# Patient Record
Sex: Male | Born: 1950 | Race: White | Hispanic: No | Marital: Married | Smoking: Current every day smoker
Health system: Southern US, Community
[De-identification: ages and names within clinical notes are randomized; demographics above are authoritative.]

## PROBLEM LIST (undated history)

## (undated) DIAGNOSIS — K700 Alcoholic fatty liver: Secondary | ICD-10-CM

## (undated) DIAGNOSIS — R569 Unspecified convulsions: Secondary | ICD-10-CM

## (undated) DIAGNOSIS — K5790 Diverticulosis of intestine, part unspecified, without perforation or abscess without bleeding: Secondary | ICD-10-CM

## (undated) DIAGNOSIS — F10931 Alcohol use, unspecified with withdrawal delirium: Secondary | ICD-10-CM

## (undated) DIAGNOSIS — G40409 Other generalized epilepsy and epileptic syndromes, not intractable, without status epilepticus: Secondary | ICD-10-CM

## (undated) DIAGNOSIS — G252 Other specified forms of tremor: Secondary | ICD-10-CM

## (undated) DIAGNOSIS — D649 Anemia, unspecified: Secondary | ICD-10-CM

## (undated) DIAGNOSIS — J45909 Unspecified asthma, uncomplicated: Secondary | ICD-10-CM

## (undated) DIAGNOSIS — S2239XA Fracture of one rib, unspecified side, initial encounter for closed fracture: Secondary | ICD-10-CM

## (undated) DIAGNOSIS — K863 Pseudocyst of pancreas: Secondary | ICD-10-CM

## (undated) DIAGNOSIS — I10 Essential (primary) hypertension: Secondary | ICD-10-CM

## (undated) DIAGNOSIS — Q539 Undescended testicle, unspecified: Secondary | ICD-10-CM

## (undated) DIAGNOSIS — D759 Disease of blood and blood-forming organs, unspecified: Secondary | ICD-10-CM

## (undated) DIAGNOSIS — F32A Depression, unspecified: Secondary | ICD-10-CM

## (undated) DIAGNOSIS — Z515 Encounter for palliative care: Secondary | ICD-10-CM

## (undated) DIAGNOSIS — F419 Anxiety disorder, unspecified: Secondary | ICD-10-CM

## (undated) DIAGNOSIS — J189 Pneumonia, unspecified organism: Secondary | ICD-10-CM

## (undated) DIAGNOSIS — K219 Gastro-esophageal reflux disease without esophagitis: Secondary | ICD-10-CM

## (undated) DIAGNOSIS — F10231 Alcohol dependence with withdrawal delirium: Secondary | ICD-10-CM

## (undated) DIAGNOSIS — R51 Headache: Secondary | ICD-10-CM

## (undated) DIAGNOSIS — K625 Hemorrhage of anus and rectum: Secondary | ICD-10-CM

## (undated) DIAGNOSIS — M545 Low back pain, unspecified: Secondary | ICD-10-CM

## (undated) DIAGNOSIS — S065X9A Traumatic subdural hemorrhage with loss of consciousness of unspecified duration, initial encounter: Secondary | ICD-10-CM

## (undated) DIAGNOSIS — Z8719 Personal history of other diseases of the digestive system: Secondary | ICD-10-CM

## (undated) DIAGNOSIS — G8929 Other chronic pain: Secondary | ICD-10-CM

## (undated) DIAGNOSIS — K259 Gastric ulcer, unspecified as acute or chronic, without hemorrhage or perforation: Secondary | ICD-10-CM

## (undated) DIAGNOSIS — I739 Peripheral vascular disease, unspecified: Secondary | ICD-10-CM

## (undated) DIAGNOSIS — K852 Alcohol induced acute pancreatitis without necrosis or infection: Secondary | ICD-10-CM

## (undated) DIAGNOSIS — E119 Type 2 diabetes mellitus without complications: Secondary | ICD-10-CM

## (undated) DIAGNOSIS — D7589 Other specified diseases of blood and blood-forming organs: Secondary | ICD-10-CM

## (undated) DIAGNOSIS — I724 Aneurysm of artery of lower extremity: Secondary | ICD-10-CM

## (undated) DIAGNOSIS — K635 Polyp of colon: Secondary | ICD-10-CM

## (undated) DIAGNOSIS — F329 Major depressive disorder, single episode, unspecified: Secondary | ICD-10-CM

## (undated) HISTORY — PX: INCISION AND DRAINAGE OF WOUND: SHX1803

## (undated) HISTORY — DX: Aneurysm of artery of lower extremity: I72.4

## (undated) HISTORY — PX: ANGIOPLASTY / STENTING ILIAC: SUR31

---
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
## 1989-04-24 HISTORY — PX: LIGAMENT REPAIR: SHX5444

## 1999-06-01 ENCOUNTER — Encounter: Payer: Self-pay | Admitting: Emergency Medicine

## 1999-06-01 ENCOUNTER — Emergency Department (HOSPITAL_COMMUNITY): Admission: EM | Admit: 1999-06-01 | Discharge: 1999-06-01 | Payer: Self-pay | Admitting: Emergency Medicine

## 2002-07-23 ENCOUNTER — Inpatient Hospital Stay (HOSPITAL_COMMUNITY): Admission: EM | Admit: 2002-07-23 | Discharge: 2002-07-26 | Payer: Self-pay | Admitting: Emergency Medicine

## 2002-07-24 DIAGNOSIS — J189 Pneumonia, unspecified organism: Secondary | ICD-10-CM

## 2002-07-24 HISTORY — DX: Pneumonia, unspecified organism: J18.9

## 2002-08-25 ENCOUNTER — Encounter: Payer: Self-pay | Admitting: Internal Medicine

## 2002-08-25 ENCOUNTER — Encounter: Admission: RE | Admit: 2002-08-25 | Discharge: 2002-08-25 | Payer: Self-pay | Admitting: Internal Medicine

## 2002-09-04 ENCOUNTER — Encounter: Payer: Self-pay | Admitting: Internal Medicine

## 2002-09-04 ENCOUNTER — Encounter: Admission: RE | Admit: 2002-09-04 | Discharge: 2002-09-04 | Payer: Self-pay | Admitting: Internal Medicine

## 2003-02-22 DIAGNOSIS — K625 Hemorrhage of anus and rectum: Secondary | ICD-10-CM

## 2003-02-22 HISTORY — DX: Hemorrhage of anus and rectum: K62.5

## 2003-03-23 ENCOUNTER — Encounter (INDEPENDENT_AMBULATORY_CARE_PROVIDER_SITE_OTHER): Payer: Self-pay | Admitting: Specialist

## 2003-03-23 ENCOUNTER — Ambulatory Visit (HOSPITAL_COMMUNITY): Admission: RE | Admit: 2003-03-23 | Discharge: 2003-03-23 | Payer: Self-pay | Admitting: Gastroenterology

## 2003-04-23 ENCOUNTER — Encounter: Payer: Self-pay | Admitting: Internal Medicine

## 2003-04-23 ENCOUNTER — Emergency Department (HOSPITAL_COMMUNITY): Admission: EM | Admit: 2003-04-23 | Discharge: 2003-04-23 | Payer: Self-pay | Admitting: Emergency Medicine

## 2003-04-23 ENCOUNTER — Ambulatory Visit (HOSPITAL_COMMUNITY): Admission: RE | Admit: 2003-04-23 | Discharge: 2003-04-23 | Payer: Self-pay | Admitting: Internal Medicine

## 2003-04-25 ENCOUNTER — Ambulatory Visit (HOSPITAL_COMMUNITY): Admission: RE | Admit: 2003-04-25 | Discharge: 2003-04-25 | Payer: Self-pay | Admitting: Internal Medicine

## 2003-04-25 ENCOUNTER — Encounter: Payer: Self-pay | Admitting: Internal Medicine

## 2003-05-14 ENCOUNTER — Encounter: Admission: RE | Admit: 2003-05-14 | Discharge: 2003-05-14 | Payer: Self-pay | Admitting: Gastroenterology

## 2003-05-14 ENCOUNTER — Encounter: Payer: Self-pay | Admitting: Gastroenterology

## 2003-11-19 ENCOUNTER — Emergency Department (HOSPITAL_COMMUNITY): Admission: EM | Admit: 2003-11-19 | Discharge: 2003-11-19 | Payer: Self-pay | Admitting: Emergency Medicine

## 2004-01-16 ENCOUNTER — Encounter: Admission: RE | Admit: 2004-01-16 | Discharge: 2004-01-16 | Payer: Self-pay | Admitting: Internal Medicine

## 2004-01-18 ENCOUNTER — Encounter: Admission: RE | Admit: 2004-01-18 | Discharge: 2004-01-18 | Payer: Self-pay | Admitting: Internal Medicine

## 2004-01-23 ENCOUNTER — Inpatient Hospital Stay (HOSPITAL_COMMUNITY): Admission: EM | Admit: 2004-01-23 | Discharge: 2004-01-27 | Payer: Self-pay | Admitting: Emergency Medicine

## 2004-02-13 ENCOUNTER — Emergency Department (HOSPITAL_COMMUNITY): Admission: EM | Admit: 2004-02-13 | Discharge: 2004-02-14 | Payer: Self-pay | Admitting: Emergency Medicine

## 2004-06-10 ENCOUNTER — Ambulatory Visit (HOSPITAL_COMMUNITY): Admission: RE | Admit: 2004-06-10 | Discharge: 2004-06-10 | Payer: Self-pay | Admitting: Gastroenterology

## 2004-07-24 DIAGNOSIS — S2239XA Fracture of one rib, unspecified side, initial encounter for closed fracture: Secondary | ICD-10-CM

## 2004-07-24 HISTORY — DX: Fracture of one rib, unspecified side, initial encounter for closed fracture: S22.39XA

## 2004-08-21 ENCOUNTER — Inpatient Hospital Stay (HOSPITAL_COMMUNITY): Admission: EM | Admit: 2004-08-21 | Discharge: 2004-08-29 | Payer: Self-pay | Admitting: Emergency Medicine

## 2005-01-19 ENCOUNTER — Inpatient Hospital Stay (HOSPITAL_COMMUNITY): Admission: EM | Admit: 2005-01-19 | Discharge: 2005-01-25 | Payer: Self-pay | Admitting: Emergency Medicine

## 2005-02-21 DIAGNOSIS — S065X9A Traumatic subdural hemorrhage with loss of consciousness of unspecified duration, initial encounter: Secondary | ICD-10-CM

## 2005-02-21 DIAGNOSIS — S065XAA Traumatic subdural hemorrhage with loss of consciousness status unknown, initial encounter: Secondary | ICD-10-CM

## 2005-02-21 HISTORY — DX: Traumatic subdural hemorrhage with loss of consciousness of unspecified duration, initial encounter: S06.5X9A

## 2005-02-21 HISTORY — DX: Traumatic subdural hemorrhage with loss of consciousness status unknown, initial encounter: S06.5XAA

## 2005-03-05 ENCOUNTER — Inpatient Hospital Stay (HOSPITAL_COMMUNITY): Admission: EM | Admit: 2005-03-05 | Discharge: 2005-03-15 | Payer: Self-pay | Admitting: Emergency Medicine

## 2005-04-14 ENCOUNTER — Encounter: Admission: RE | Admit: 2005-04-14 | Discharge: 2005-04-14 | Payer: Self-pay | Admitting: Neurosurgery

## 2005-04-20 ENCOUNTER — Ambulatory Visit (HOSPITAL_COMMUNITY): Admission: RE | Admit: 2005-04-20 | Discharge: 2005-04-20 | Payer: Self-pay | Admitting: Neurosurgery

## 2005-05-04 ENCOUNTER — Emergency Department (HOSPITAL_COMMUNITY): Admission: EM | Admit: 2005-05-04 | Discharge: 2005-05-04 | Payer: Self-pay | Admitting: Emergency Medicine

## 2005-07-03 ENCOUNTER — Encounter: Admission: RE | Admit: 2005-07-03 | Discharge: 2005-07-03 | Payer: Self-pay | Admitting: Neurosurgery

## 2005-11-15 ENCOUNTER — Emergency Department (HOSPITAL_COMMUNITY): Admission: EM | Admit: 2005-11-15 | Discharge: 2005-11-16 | Payer: Self-pay | Admitting: Emergency Medicine

## 2006-01-02 ENCOUNTER — Emergency Department (HOSPITAL_COMMUNITY): Admission: EM | Admit: 2006-01-02 | Discharge: 2006-01-03 | Payer: Self-pay | Admitting: Emergency Medicine

## 2006-01-07 ENCOUNTER — Observation Stay (HOSPITAL_COMMUNITY): Admission: EM | Admit: 2006-01-07 | Discharge: 2006-01-11 | Payer: Self-pay | Admitting: Neurology

## 2006-02-24 ENCOUNTER — Emergency Department (HOSPITAL_COMMUNITY): Admission: EM | Admit: 2006-02-24 | Discharge: 2006-02-25 | Payer: Self-pay | Admitting: Emergency Medicine

## 2008-06-25 ENCOUNTER — Inpatient Hospital Stay (HOSPITAL_COMMUNITY): Admission: EM | Admit: 2008-06-25 | Discharge: 2008-06-27 | Payer: Self-pay | Admitting: Emergency Medicine

## 2008-11-06 ENCOUNTER — Inpatient Hospital Stay (HOSPITAL_COMMUNITY): Admission: EM | Admit: 2008-11-06 | Discharge: 2008-11-10 | Payer: Self-pay | Admitting: Emergency Medicine

## 2009-03-09 ENCOUNTER — Inpatient Hospital Stay (HOSPITAL_COMMUNITY): Admission: EM | Admit: 2009-03-09 | Discharge: 2009-03-11 | Payer: Self-pay | Admitting: Emergency Medicine

## 2009-05-03 ENCOUNTER — Inpatient Hospital Stay (HOSPITAL_COMMUNITY): Admission: EM | Admit: 2009-05-03 | Discharge: 2009-05-08 | Payer: Self-pay | Admitting: Emergency Medicine

## 2009-05-15 ENCOUNTER — Encounter: Admission: RE | Admit: 2009-05-15 | Discharge: 2009-05-15 | Payer: Self-pay | Admitting: Family Medicine

## 2009-11-30 IMAGING — CR DG CHEST 1V PORT
1 series · 1 of 1 positions shown · non-contrast
Comparison: Chest x-ray of 06/26/2008

CLINICAL DATA: Nodular opacity lung bases, repeat film with nipple
markers

PORTABLE CHEST - 1 VIEW

[AP]
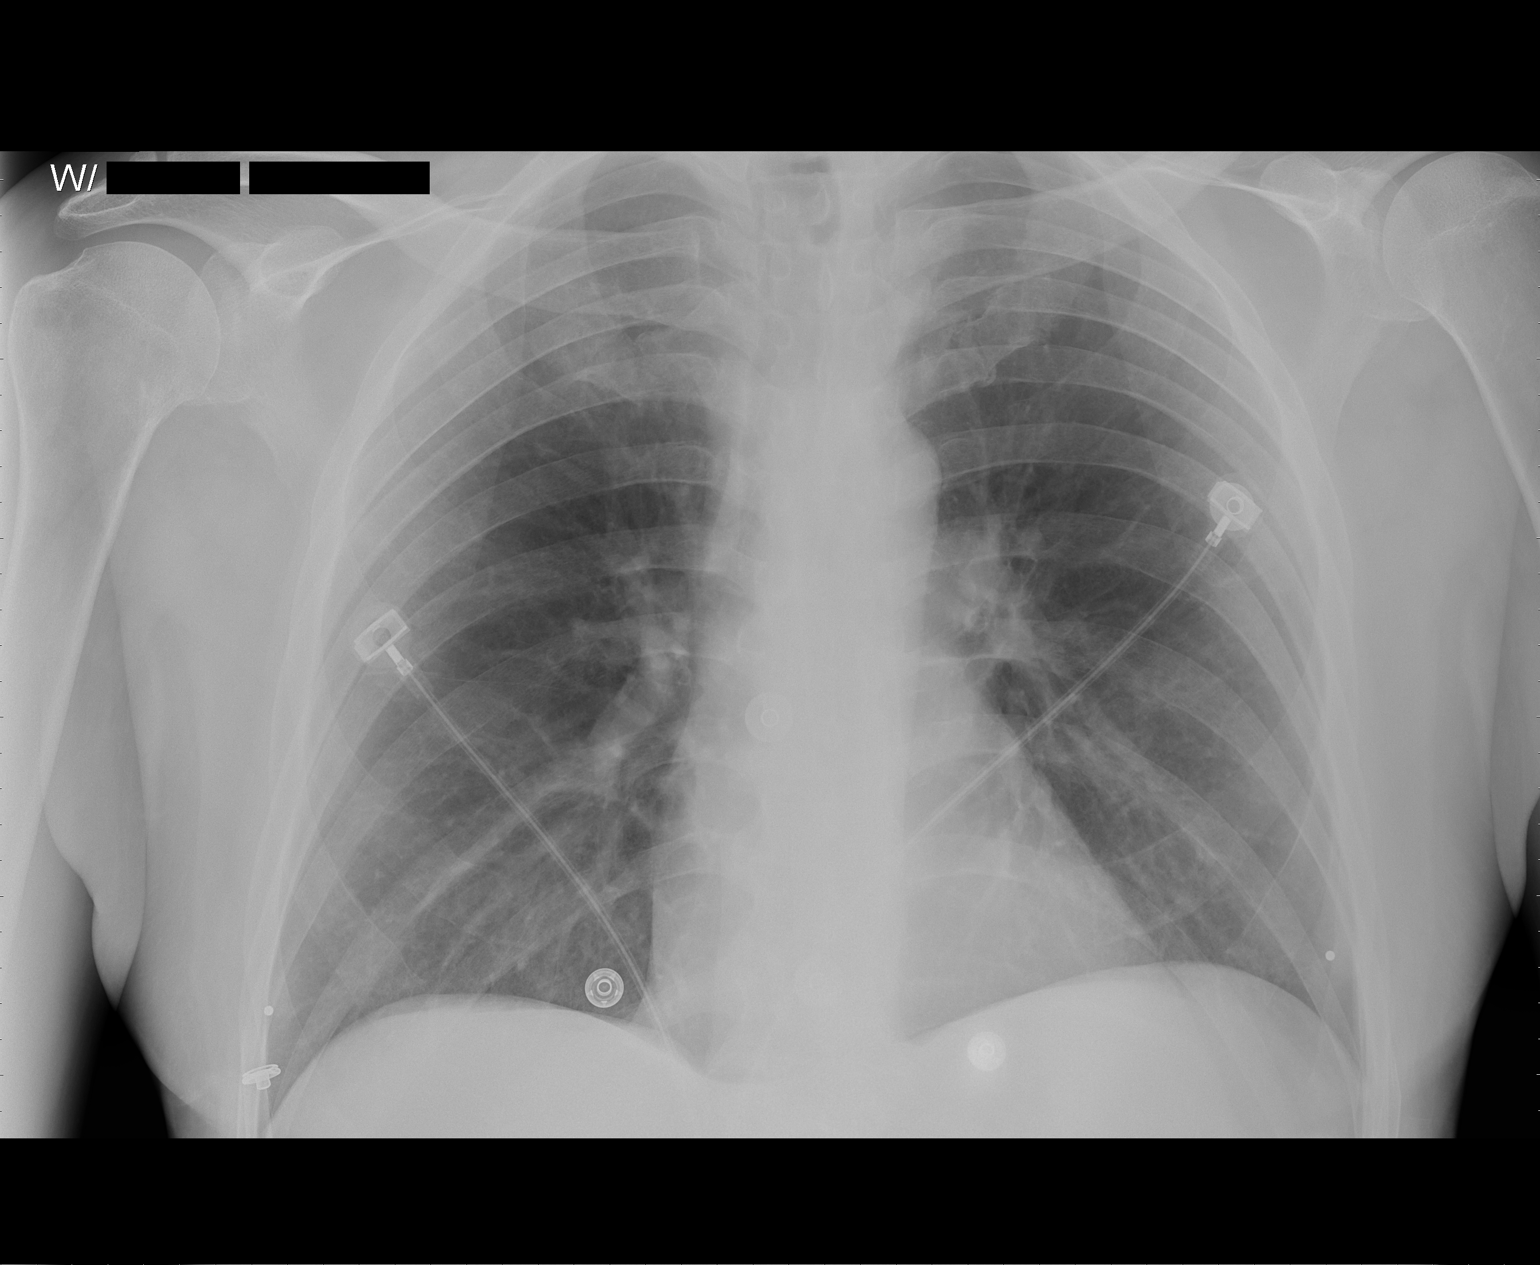

[1 of 1 positions shown; findings below may reference images not displayed]

FINDINGS: With nipple markers placed, no additional lung nodule is
seen.  No infiltrate or effusion is noted.  Heart is within normal
limits in size.
IMPRESSION: No persistent lung nodule after placement nipple markers.

## 2009-11-30 IMAGING — CT CT PELVIS W/ CM
2 of 5 series · 16 of 46 positions shown, 18 images · IV contrast (APPLIED)
Comparison: Acute abdomen series 1 day prior.

CLINICAL DATA: Vomiting for 4 days.  Diabetic ketoacidosis.

CT ABDOMEN AND PELVIS WITH CONTRAST
TECHNIQUE: Multidetector CT imaging of the abdomen and pelvis was
performed following the standard protocol following the bolus
administration of intravenous contrast.
Contrast: 100 ml Ymnipaque-JOO

[Series 2: abd/pelv with 5.0 b31f st · axial · 0.71mm/px · z∈[-494,-104]mm · 13 of 86 slices shown, 15 images]
[im 4/86  soft-tissue]
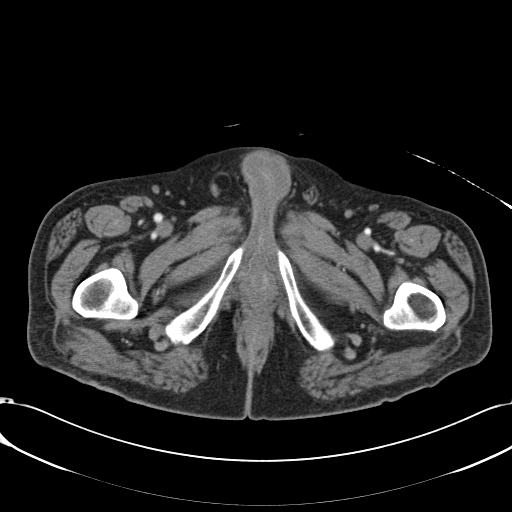
[im 4/86  bone]
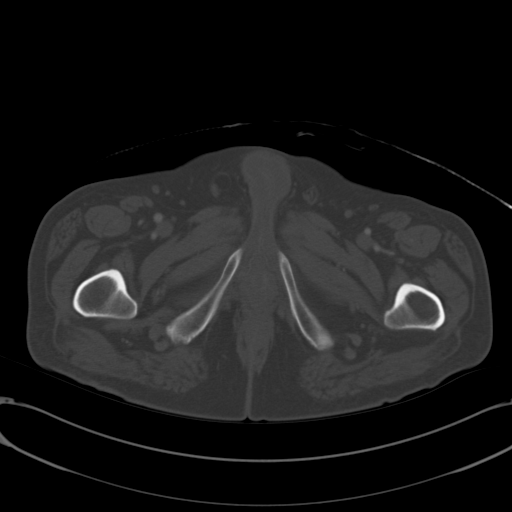
[im 12/86  soft-tissue]
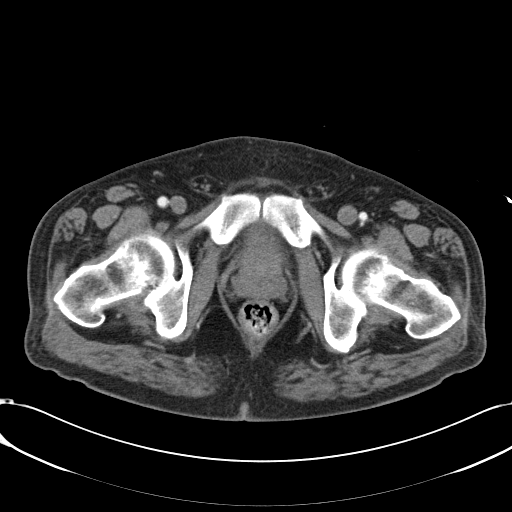
[im 20/86  soft-tissue]
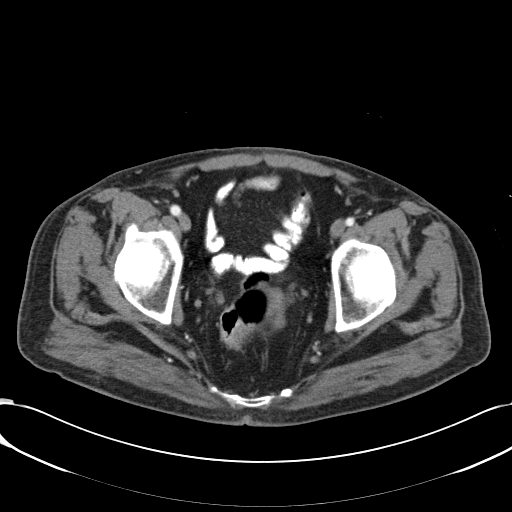
[im 24/86  soft-tissue]
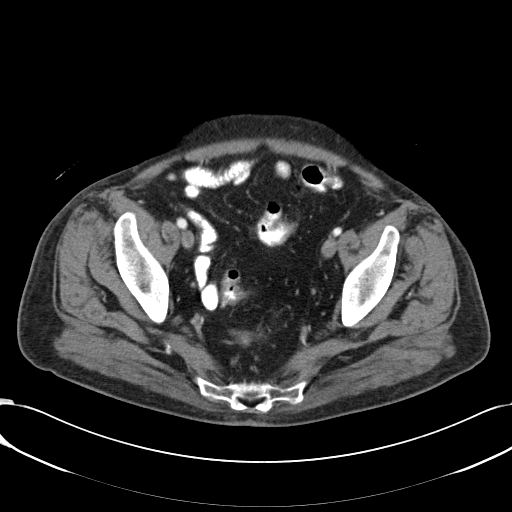
[im 31/86  soft-tissue]
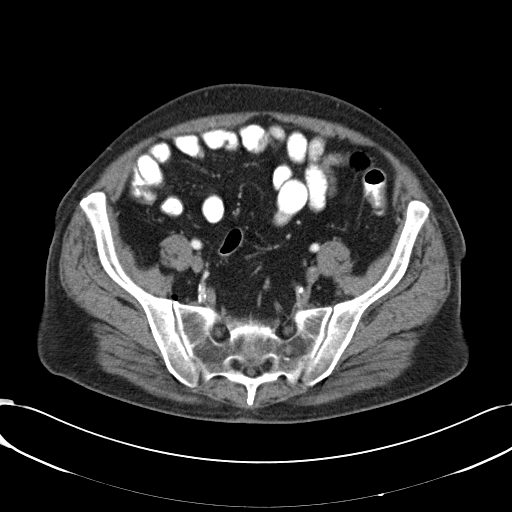
[im 35/86  soft-tissue]
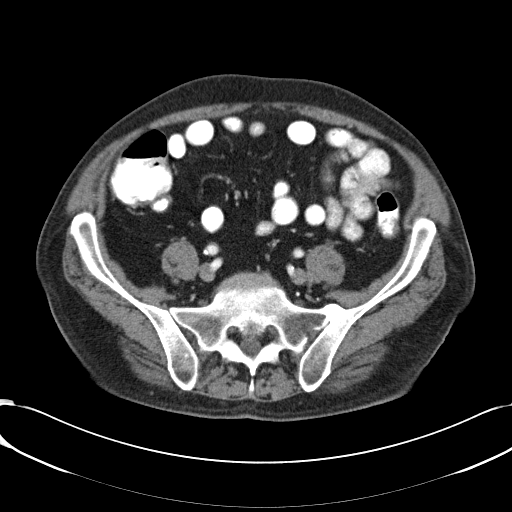
[im 43/86  soft-tissue]
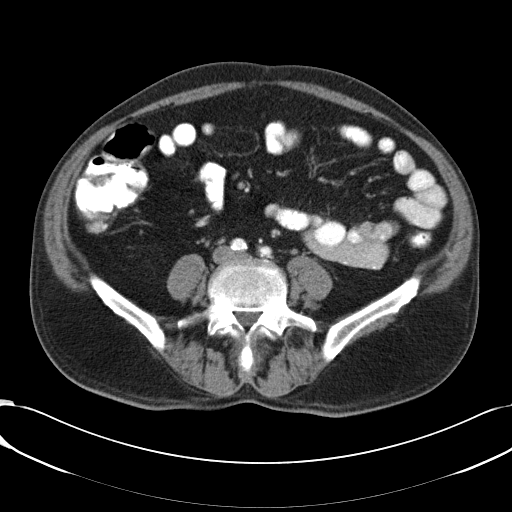
[im 51/86  soft-tissue]
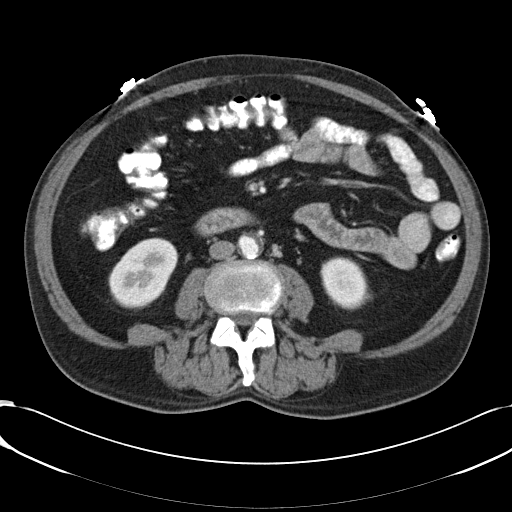
[im 55/86  soft-tissue]
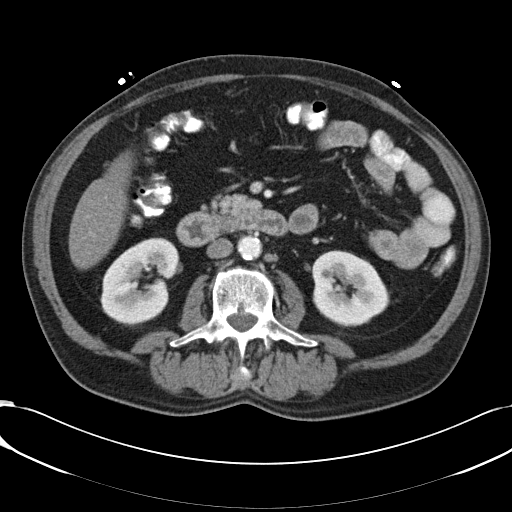
[im 55/86  bone]
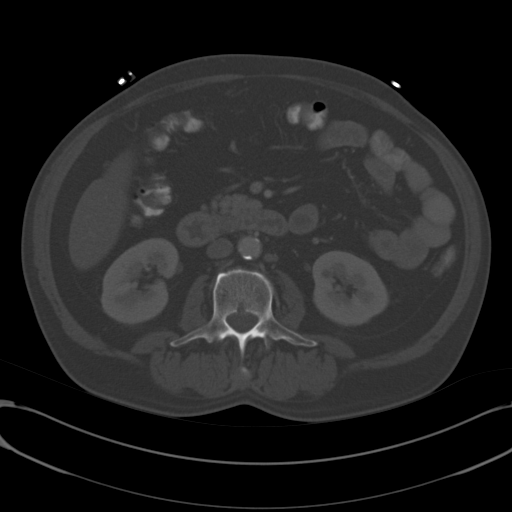
[im 62/86  soft-tissue]
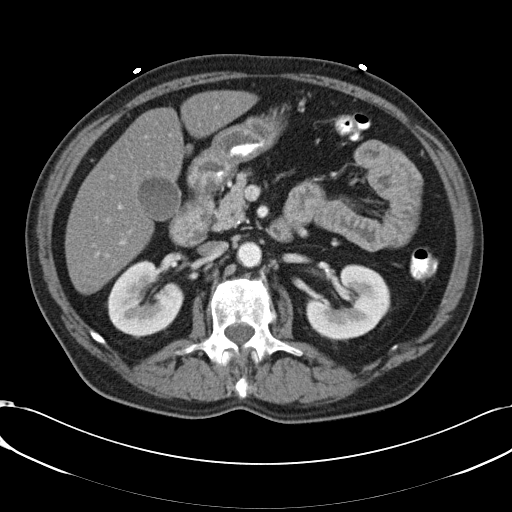
[im 66/86  soft-tissue]
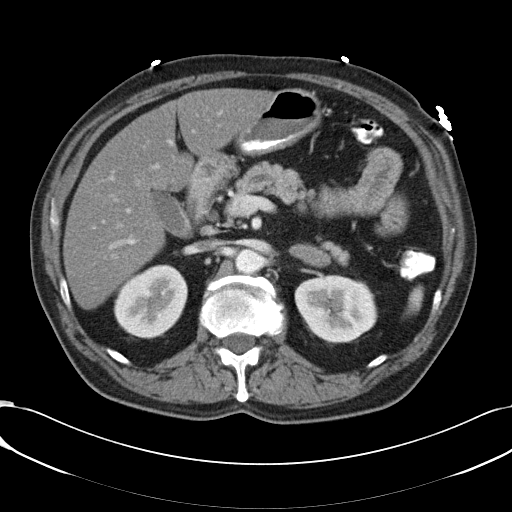
[im 74/86  soft-tissue]
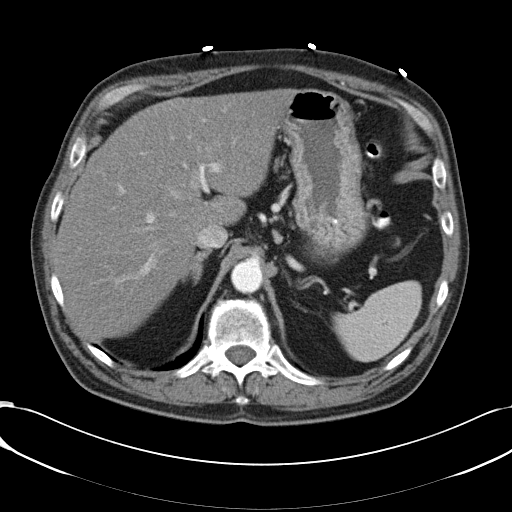
[im 82/86  soft-tissue]
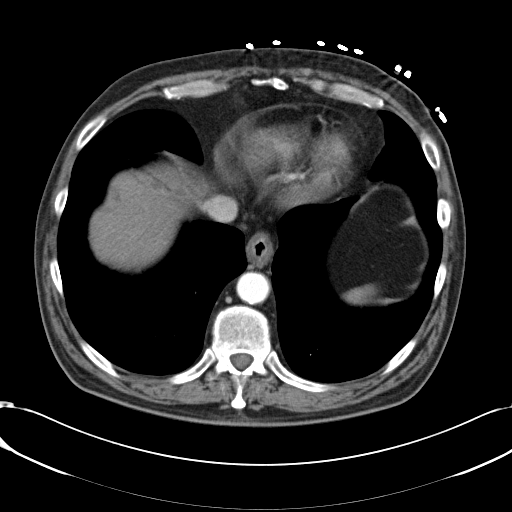

[Series 602: cor · coronal · 0.84mm/px · 3 of 84 slices shown]
[im 28/84  soft-tissue]
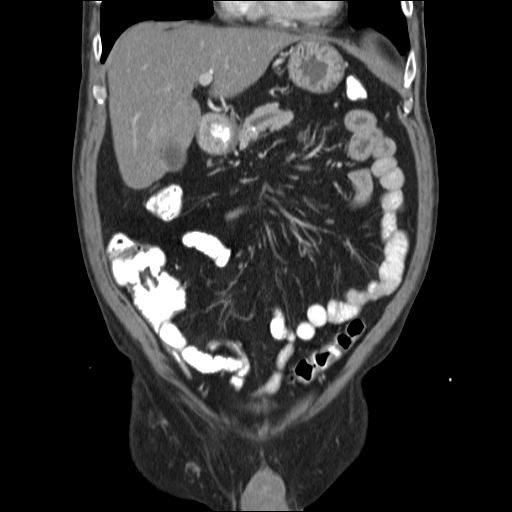
[im 37/84  soft-tissue]
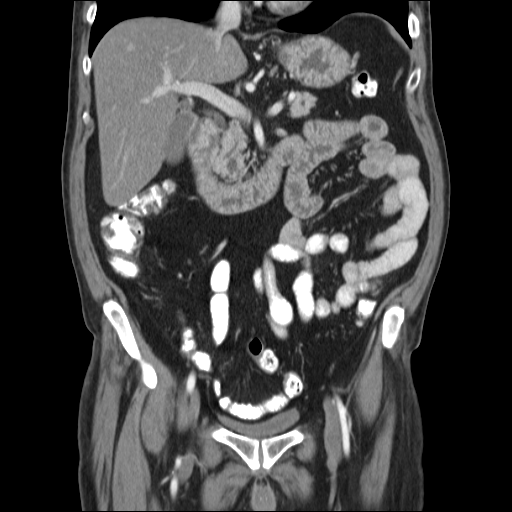
[im 47/84  soft-tissue]
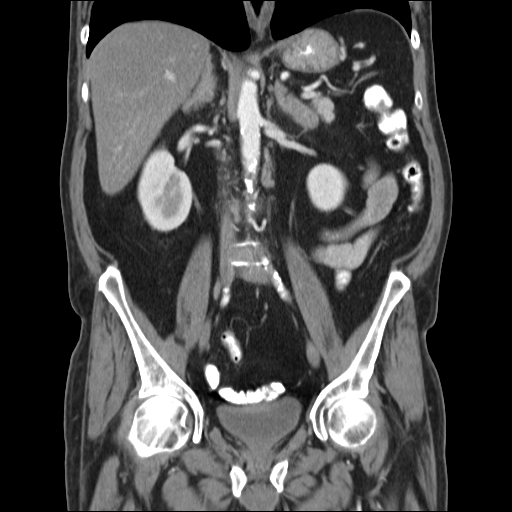

[16 of 46 positions shown; findings below may reference images not displayed]

Ultrasound of
01/19/2005.  MRCP of 06/10/2004.  Abdominal pelvic CT of 02/14/2004

CT ABDOMEN
FINDINGS: Clear lung bases.  Normal heart size without pericardial
or pleural effusion.

Moderate distal esophageal wall thickening on image three.

Moderate fatty infiltration of the liver.  Probable perfusion
anomaly at hepatic dome on image 90.  Not well visualized on the
prior exam.

Normal spleen.  Apparent greater curvature gastric wall thickening
likely related to underdistension.

Interval increase in pancreatic ductal dilatation.  This is
borderline/mild on the 02/11/2004 exam.  Now mild - moderate in the
pancreatic tail and upstream body.  Maximally 6 mm on image 21.
The duct is dilated to the level of a dominant pancreatic
calcification measuring 7 mm on image 22.  There is adjacent
smaller calcification just inferiorly.  The pancreatic duct in the
head uncinate process measures at the upper limits of normal.

Normal gallbladder.  No intrahepatic biliary ductal dilatation.

Hypoattenuating lesion in the uncinate process of the pancreas
measures 1.0 cm on image 29 and is likely similar to on image 35 of
the prior exam.  Most likely a pseudocyst.  No evidence of acute
pancreatitis.

The portal vein and splenic vein are patent.  There are no vascular
complications of pancreatitis

Normal right adrenal gland.  Mild left adrenal thickening which is
stable.  Normal right kidney.  Too small to characterize left renal
lesions.  Moderate aortic atherosclerosis. No retroperitoneal or
retrocrural adenopathy. Scattered colonic diverticula. Normal
terminal ileum appendix. Normal abdominal small bowel without
ascites.
IMPRESSION: 1.  Interval development/progression of upstream pancreatic ductal
dilatation to the level of a pancreatic duct stone of 7 mm.
Findings are consistent with chronic calcific pancreatitis.  No
evidence of underlying pancreatic duct mass.
2.  No evidence of acute pancreatitis.
3.  Probable uncinate process pseudocyst, stable.
4.  Fatty infiltration of the liver.
5.  Moderate distal esophageal wall thickening.  Most likely
esophagitis.  Neoplasia cannot be excluded given the extent of wall
thickening.  Consider further evaluation with endoscopy.

CT PELVIS
FINDINGS: Sigmoid diverticulosis.  Normal pelvic small bowel
loops.  Age advanced vascular calcifications. No pelvic adenopathy.
Normal urinary bladder and prostate.  No ascites.   Multifactorial
central canal lumbar stenosis.  At least partially secondary to
congenitally short pedicles.
IMPRESSION: 1. No acute pelvic process.

## 2010-05-01 ENCOUNTER — Inpatient Hospital Stay (HOSPITAL_COMMUNITY): Admission: EM | Admit: 2010-05-01 | Discharge: 2010-05-05 | Payer: Self-pay | Admitting: Emergency Medicine

## 2010-05-28 ENCOUNTER — Ambulatory Visit (HOSPITAL_COMMUNITY): Admission: RE | Admit: 2010-05-28 | Discharge: 2010-05-28 | Payer: Self-pay | Admitting: Gastroenterology

## 2010-09-14 ENCOUNTER — Encounter: Payer: Self-pay | Admitting: Neurosurgery

## 2010-11-05 LAB — GLUCOSE, CAPILLARY
Glucose-Capillary: 234 mg/dL — ABNORMAL HIGH (ref 70–99)
Glucose-Capillary: 236 mg/dL — ABNORMAL HIGH (ref 70–99)

## 2010-11-05 LAB — PANC CYST FLD ANLYS-PATHFNDR-TG

## 2010-11-06 LAB — CARDIAC PANEL(CRET KIN+CKTOT+MB+TROPI)
CK, MB: 1.1 ng/mL (ref 0.3–4.0)
CK, MB: 1.3 ng/mL (ref 0.3–4.0)
Relative Index: INVALID (ref 0.0–2.5)
Total CK: 39 U/L (ref 7–232)
Total CK: 39 U/L (ref 7–232)
Troponin I: 0.01 ng/mL (ref 0.00–0.06)

## 2010-11-06 LAB — BASIC METABOLIC PANEL
CO2: 26 mEq/L (ref 19–32)
CO2: 27 mEq/L (ref 19–32)
Calcium: 8.7 mg/dL (ref 8.4–10.5)
Creatinine, Ser: 0.74 mg/dL (ref 0.4–1.5)
GFR calc Af Amer: >60 mL/min (ref >60)
Glucose, Bld: 207 mg/dL — ABNORMAL HIGH (ref 70–99)
Glucose, Bld: 54 mg/dL — ABNORMAL LOW (ref 70–99)
Potassium: 3.5 mEq/L (ref 3.5–5.1)
Sodium: 138 mEq/L (ref 135–145)

## 2010-11-06 LAB — CULTURE, BLOOD (ROUTINE X 2)

## 2010-11-06 LAB — CBC
MCHC: 35.3 g/dL (ref 30.0–36.0)
Platelets: 183 10*3/uL (ref 150–400)
Platelets: 204 10*3/uL (ref 150–400)
RBC: 3.85 MIL/uL — ABNORMAL LOW (ref 4.22–5.81)
RDW: 14.2 % (ref 11.5–15.5)
RDW: 14.2 % (ref 11.5–15.5)
WBC: 10 10*3/uL (ref 4.0–10.5)
WBC: 7.8 10*3/uL (ref 4.0–10.5)

## 2010-11-06 LAB — DIFFERENTIAL
Basophils Relative: 0 % (ref 0–1)
Eosinophils Absolute: 0 10*3/uL (ref 0.0–0.7)
Lymphs Abs: 3.3 10*3/uL (ref 0.7–4.0)
Monocytes Absolute: 0.4 10*3/uL (ref 0.1–1.0)
Monocytes Relative: 6 % (ref 3–12)
Neutro Abs: 3.9 10*3/uL (ref 1.7–7.7)
Neutrophils Relative %: 51 % (ref 43–77)

## 2010-11-06 LAB — GLUCOSE, CAPILLARY
Glucose-Capillary: 113 mg/dL — ABNORMAL HIGH (ref 70–99)
Glucose-Capillary: 140 mg/dL — ABNORMAL HIGH (ref 70–99)
Glucose-Capillary: 165 mg/dL — ABNORMAL HIGH (ref 70–99)
Glucose-Capillary: 169 mg/dL — ABNORMAL HIGH (ref 70–99)
Glucose-Capillary: 229 mg/dL — ABNORMAL HIGH (ref 70–99)
Glucose-Capillary: 236 mg/dL — ABNORMAL HIGH (ref 70–99)
Glucose-Capillary: 246 mg/dL — ABNORMAL HIGH (ref 70–99)
Glucose-Capillary: 54 mg/dL — ABNORMAL LOW (ref 70–99)

## 2010-11-06 LAB — COMPREHENSIVE METABOLIC PANEL
ALT: 46 U/L (ref 0–53)
ALT: 60 U/L — ABNORMAL HIGH (ref 0–53)
AST: 42 U/L — ABNORMAL HIGH (ref 0–37)
Albumin: 3.6 g/dL (ref 3.5–5.2)
Albumin: 4.1 g/dL (ref 3.5–5.2)
Alkaline Phosphatase: 56 U/L (ref 39–117)
Alkaline Phosphatase: 70 U/L (ref 39–117)
BUN: 4 mg/dL — ABNORMAL LOW (ref 6–23)
Calcium: 8.8 mg/dL (ref 8.4–10.5)
GFR calc Af Amer: >60 mL/min (ref >60)
GFR calc Af Amer: >60 mL/min (ref >60)
Potassium: 3.3 mEq/L — ABNORMAL LOW (ref 3.5–5.1)
Potassium: 3.5 mEq/L (ref 3.5–5.1)
Sodium: 139 mEq/L (ref 135–145)
Sodium: 141 mEq/L (ref 135–145)
Total Protein: 6.4 g/dL (ref 6.0–8.3)
Total Protein: 7.5 g/dL (ref 6.0–8.3)

## 2010-11-06 LAB — URINE MICROSCOPIC-ADD ON

## 2010-11-06 LAB — HEMOGLOBIN A1C: Mean Plasma Glucose: 183 mg/dL — ABNORMAL HIGH (ref <117)

## 2010-11-06 LAB — URINALYSIS, ROUTINE W REFLEX MICROSCOPIC
Glucose, UA: >1000 mg/dL — AB
Hgb urine dipstick: NEGATIVE
Leukocytes, UA: NEGATIVE
Protein, ur: NEGATIVE mg/dL
pH: 6 (ref 5.0–8.0)

## 2010-11-06 LAB — POCT CARDIAC MARKERS
CKMB, poc: <1 ng/mL — ABNORMAL LOW (ref 1.0–8.0)
Myoglobin, poc: 27.8 ng/mL (ref 12–200)
Troponin i, poc: <0.05 ng/mL (ref 0.00–0.09)

## 2010-11-06 LAB — ETHANOL: Alcohol, Ethyl (B): 278 mg/dL — ABNORMAL HIGH (ref 0–10)

## 2010-11-06 LAB — PROTIME-INR
INR: 0.93 (ref 0.00–1.49)
Prothrombin Time: 12.7 seconds (ref 11.6–15.2)

## 2010-11-06 LAB — LIPASE, BLOOD: Lipase: 22 U/L (ref 11–59)

## 2010-11-06 LAB — TSH: TSH: 1.849 u[IU]/mL (ref 0.350–4.500)

## 2010-11-28 LAB — COMPREHENSIVE METABOLIC PANEL
AST: 38 U/L — ABNORMAL HIGH (ref 0–37)
Albumin: 3 g/dL — ABNORMAL LOW (ref 3.5–5.2)
Alkaline Phosphatase: 59 U/L (ref 39–117)
BUN: 11 mg/dL (ref 6–23)
BUN: 3 mg/dL — ABNORMAL LOW (ref 6–23)
BUN: 8 mg/dL (ref 6–23)
CO2: 22 mEq/L (ref 19–32)
CO2: 28 mEq/L (ref 19–32)
CO2: 30 mEq/L (ref 19–32)
Calcium: 8.7 mg/dL (ref 8.4–10.5)
Calcium: 8.8 mg/dL (ref 8.4–10.5)
Chloride: 102 mEq/L (ref 96–112)
Chloride: 103 mEq/L (ref 96–112)
Chloride: 106 mEq/L (ref 96–112)
Creatinine, Ser: 0.56 mg/dL (ref 0.4–1.5)
Creatinine, Ser: 0.56 mg/dL (ref 0.4–1.5)
Creatinine, Ser: 0.57 mg/dL (ref 0.4–1.5)
GFR calc Af Amer: >60 mL/min (ref >60)
GFR calc non Af Amer: >60 mL/min (ref >60)
GFR calc non Af Amer: >60 mL/min (ref >60)
GFR calc non Af Amer: >60 mL/min (ref >60)
Glucose, Bld: 147 mg/dL — ABNORMAL HIGH (ref 70–99)
Total Bilirubin: 0.5 mg/dL (ref 0.3–1.2)
Total Bilirubin: 0.8 mg/dL (ref 0.3–1.2)
Total Bilirubin: 0.9 mg/dL (ref 0.3–1.2)

## 2010-11-28 LAB — CBC
HCT: 37.3 % — ABNORMAL LOW (ref 39.0–52.0)
HCT: 40.4 % (ref 39.0–52.0)
Hemoglobin: 12.9 g/dL — ABNORMAL LOW (ref 13.0–17.0)
MCHC: 34.8 g/dL (ref 30.0–36.0)
MCV: 107.9 fL — ABNORMAL HIGH (ref 78.0–100.0)
MCV: 108.7 fL — ABNORMAL HIGH (ref 78.0–100.0)
MCV: 108.7 fL — ABNORMAL HIGH (ref 78.0–100.0)
Platelets: 149 10*3/uL — ABNORMAL LOW (ref 150–400)
Platelets: 190 10*3/uL (ref 150–400)
RBC: 3.43 MIL/uL — ABNORMAL LOW (ref 4.22–5.81)
RBC: 3.74 MIL/uL — ABNORMAL LOW (ref 4.22–5.81)
WBC: 5.8 10*3/uL (ref 4.0–10.5)
WBC: 8 10*3/uL (ref 4.0–10.5)

## 2010-11-28 LAB — MAGNESIUM: Magnesium: 1.6 mg/dL (ref 1.5–2.5)

## 2010-11-28 LAB — DIFFERENTIAL
Basophils Absolute: 0 10*3/uL (ref 0.0–0.1)
Basophils Relative: 1 % (ref 0–1)
Eosinophils Absolute: 0 10*3/uL (ref 0.0–0.7)
Lymphs Abs: 2.6 10*3/uL (ref 0.7–4.0)
Neutrophils Relative %: 42 % — ABNORMAL LOW (ref 43–77)

## 2010-11-28 LAB — GLUCOSE, CAPILLARY
Glucose-Capillary: 104 mg/dL — ABNORMAL HIGH (ref 70–99)
Glucose-Capillary: 106 mg/dL — ABNORMAL HIGH (ref 70–99)
Glucose-Capillary: 107 mg/dL — ABNORMAL HIGH (ref 70–99)
Glucose-Capillary: 120 mg/dL — ABNORMAL HIGH (ref 70–99)
Glucose-Capillary: 136 mg/dL — ABNORMAL HIGH (ref 70–99)
Glucose-Capillary: 159 mg/dL — ABNORMAL HIGH (ref 70–99)
Glucose-Capillary: 187 mg/dL — ABNORMAL HIGH (ref 70–99)
Glucose-Capillary: 199 mg/dL — ABNORMAL HIGH (ref 70–99)
Glucose-Capillary: 229 mg/dL — ABNORMAL HIGH (ref 70–99)
Glucose-Capillary: 55 mg/dL — ABNORMAL LOW (ref 70–99)

## 2010-11-28 LAB — BASIC METABOLIC PANEL
BUN: 5 mg/dL — ABNORMAL LOW (ref 6–23)
GFR calc Af Amer: >60 mL/min (ref >60)
GFR calc non Af Amer: >60 mL/min (ref >60)
Potassium: 4.9 mEq/L (ref 3.5–5.1)

## 2010-11-28 LAB — HEPATIC FUNCTION PANEL
Alkaline Phosphatase: 78 U/L (ref 39–117)
Indirect Bilirubin: 0.5 mg/dL (ref 0.3–0.9)
Total Protein: 7 g/dL (ref 6.0–8.3)

## 2010-11-28 LAB — T4, FREE: Free T4: 0.65 ng/dL — ABNORMAL LOW (ref 0.80–1.80)

## 2010-11-28 LAB — PROTIME-INR
INR: 0.9 (ref 0.00–1.49)
Prothrombin Time: 12.4 seconds (ref 11.6–15.2)

## 2010-11-28 LAB — ETHANOL: Alcohol, Ethyl (B): 353 mg/dL — ABNORMAL HIGH (ref 0–10)

## 2010-11-30 LAB — HEMOGLOBIN A1C
Hgb A1c MFr Bld: 8.8 % — ABNORMAL HIGH (ref 4.6–6.1)
Mean Plasma Glucose: 206 mg/dL

## 2010-11-30 LAB — COMPREHENSIVE METABOLIC PANEL
Albumin: 3.3 g/dL — ABNORMAL LOW (ref 3.5–5.2)
Alkaline Phosphatase: 68 U/L (ref 39–117)
BUN: 4 mg/dL — ABNORMAL LOW (ref 6–23)
BUN: 6 mg/dL (ref 6–23)
CO2: 26 mEq/L (ref 19–32)
Chloride: 103 mEq/L (ref 96–112)
Chloride: 106 mEq/L (ref 96–112)
Creatinine, Ser: 0.5 mg/dL (ref 0.4–1.5)
GFR calc non Af Amer: >60 mL/min (ref >60)
GFR calc non Af Amer: >60 mL/min (ref >60)
Glucose, Bld: 147 mg/dL — ABNORMAL HIGH (ref 70–99)
Glucose, Bld: 73 mg/dL (ref 70–99)
Potassium: 3.1 mEq/L — ABNORMAL LOW (ref 3.5–5.1)
Total Bilirubin: 0.4 mg/dL (ref 0.3–1.2)
Total Bilirubin: 0.7 mg/dL (ref 0.3–1.2)

## 2010-11-30 LAB — GLUCOSE, CAPILLARY
Glucose-Capillary: 173 mg/dL — ABNORMAL HIGH (ref 70–99)
Glucose-Capillary: 205 mg/dL — ABNORMAL HIGH (ref 70–99)
Glucose-Capillary: 210 mg/dL — ABNORMAL HIGH (ref 70–99)
Glucose-Capillary: 81 mg/dL (ref 70–99)
Glucose-Capillary: 89 mg/dL (ref 70–99)
Glucose-Capillary: 90 mg/dL (ref 70–99)

## 2010-11-30 LAB — BASIC METABOLIC PANEL
CO2: 24 mEq/L (ref 19–32)
GFR calc non Af Amer: >60 mL/min (ref >60)
Glucose, Bld: 103 mg/dL — ABNORMAL HIGH (ref 70–99)
Potassium: 2.9 mEq/L — ABNORMAL LOW (ref 3.5–5.1)
Sodium: 137 mEq/L (ref 135–145)

## 2010-11-30 LAB — LIPASE, BLOOD: Lipase: 25 U/L (ref 11–59)

## 2010-11-30 LAB — URINALYSIS, ROUTINE W REFLEX MICROSCOPIC
Glucose, UA: NEGATIVE mg/dL
Ketones, ur: 15 mg/dL — AB
Protein, ur: NEGATIVE mg/dL

## 2010-11-30 LAB — URINE CULTURE: Culture: NO GROWTH

## 2010-11-30 LAB — TYPE AND SCREEN

## 2010-11-30 LAB — CBC
HCT: 38.6 % — ABNORMAL LOW (ref 39.0–52.0)
MCV: 107.8 fL — ABNORMAL HIGH (ref 78.0–100.0)
Platelets: 90 10*3/uL — ABNORMAL LOW (ref 150–400)
WBC: 5.8 10*3/uL (ref 4.0–10.5)

## 2010-11-30 LAB — PHENYTOIN LEVEL, TOTAL: Phenytoin Lvl: <2.5 ug/mL — ABNORMAL LOW (ref 10.0–20.0)

## 2010-11-30 LAB — URINE MICROSCOPIC-ADD ON

## 2010-11-30 LAB — PHOSPHORUS: Phosphorus: 2.6 mg/dL (ref 2.3–4.6)

## 2010-11-30 LAB — MAGNESIUM: Magnesium: 1.6 mg/dL (ref 1.5–2.5)

## 2010-12-04 LAB — LIPID PANEL
HDL: 37 mg/dL — ABNORMAL LOW (ref >39)
LDL Cholesterol: 33 mg/dL (ref 0–99)
Total CHOL/HDL Ratio: 2.5 RATIO
VLDL: 22 mg/dL (ref 0–40)

## 2010-12-04 LAB — CARDIAC PANEL(CRET KIN+CKTOT+MB+TROPI)
CK, MB: 0.6 ng/mL (ref 0.3–4.0)
Troponin I: <0.01 ng/mL (ref 0.00–0.06)
Troponin I: <0.01 ng/mL (ref 0.00–0.06)

## 2010-12-04 LAB — DIFFERENTIAL
Basophils Absolute: 0 10*3/uL (ref 0.0–0.1)
Eosinophils Absolute: 0 10*3/uL (ref 0.0–0.7)
Lymphocytes Relative: 21 % (ref 12–46)
Monocytes Relative: 22 % — ABNORMAL HIGH (ref 3–12)
Neutro Abs: 4.6 10*3/uL (ref 1.7–7.7)
Neutrophils Relative %: 57 % (ref 43–77)

## 2010-12-04 LAB — CK TOTAL AND CKMB (NOT AT ARMC): Relative Index: INVALID (ref 0.0–2.5)

## 2010-12-04 LAB — BASIC METABOLIC PANEL
BUN: 4 mg/dL — ABNORMAL LOW (ref 6–23)
BUN: <1 mg/dL — ABNORMAL LOW (ref 6–23)
CO2: 24 mEq/L (ref 19–32)
Calcium: 7.7 mg/dL — ABNORMAL LOW (ref 8.4–10.5)
Calcium: 8 mg/dL — ABNORMAL LOW (ref 8.4–10.5)
Chloride: 105 mEq/L (ref 96–112)
Creatinine, Ser: 0.45 mg/dL (ref 0.4–1.5)
Creatinine, Ser: 0.49 mg/dL (ref 0.4–1.5)
Creatinine, Ser: 0.7 mg/dL (ref 0.4–1.5)
GFR calc Af Amer: >60 mL/min (ref >60)
GFR calc non Af Amer: >60 mL/min (ref >60)
GFR calc non Af Amer: >60 mL/min (ref >60)
Potassium: 3.4 mEq/L — ABNORMAL LOW (ref 3.5–5.1)
Potassium: 3.6 mEq/L (ref 3.5–5.1)

## 2010-12-04 LAB — HEMOGLOBIN A1C: Hgb A1c MFr Bld: 9.8 % — ABNORMAL HIGH (ref 4.6–6.1)

## 2010-12-04 LAB — COMPREHENSIVE METABOLIC PANEL
Albumin: 3.3 g/dL — ABNORMAL LOW (ref 3.5–5.2)
Alkaline Phosphatase: 39 U/L (ref 39–117)
Alkaline Phosphatase: 59 U/L (ref 39–117)
BUN: 6 mg/dL (ref 6–23)
BUN: 7 mg/dL (ref 6–23)
Calcium: 7.9 mg/dL — ABNORMAL LOW (ref 8.4–10.5)
Chloride: 85 mEq/L — ABNORMAL LOW (ref 96–112)
Creatinine, Ser: 0.75 mg/dL (ref 0.4–1.5)
GFR calc Af Amer: >60 mL/min (ref >60)
Glucose, Bld: 198 mg/dL — ABNORMAL HIGH (ref 70–99)
Glucose, Bld: 526 mg/dL (ref 70–99)
Total Bilirubin: 0.5 mg/dL (ref 0.3–1.2)
Total Protein: 4.6 g/dL — ABNORMAL LOW (ref 6.0–8.3)

## 2010-12-04 LAB — GLUCOSE, CAPILLARY
Glucose-Capillary: 110 mg/dL — ABNORMAL HIGH (ref 70–99)
Glucose-Capillary: 145 mg/dL — ABNORMAL HIGH (ref 70–99)
Glucose-Capillary: 153 mg/dL — ABNORMAL HIGH (ref 70–99)
Glucose-Capillary: 155 mg/dL — ABNORMAL HIGH (ref 70–99)
Glucose-Capillary: 183 mg/dL — ABNORMAL HIGH (ref 70–99)
Glucose-Capillary: 216 mg/dL — ABNORMAL HIGH (ref 70–99)
Glucose-Capillary: 260 mg/dL — ABNORMAL HIGH (ref 70–99)

## 2010-12-04 LAB — CBC
HCT: 37.5 % — ABNORMAL LOW (ref 39.0–52.0)
MCHC: 35 g/dL (ref 30.0–36.0)
MCV: 105.3 fL — ABNORMAL HIGH (ref 78.0–100.0)
RBC: 3.56 MIL/uL — ABNORMAL LOW (ref 4.22–5.81)
WBC: 8.1 10*3/uL (ref 4.0–10.5)

## 2010-12-04 LAB — TROPONIN I: Troponin I: <0.01 ng/mL (ref 0.00–0.06)

## 2010-12-04 LAB — LIPASE, BLOOD
Lipase: 31 U/L (ref 11–59)
Lipase: 61 U/L — ABNORMAL HIGH (ref 11–59)

## 2010-12-04 LAB — D-DIMER, QUANTITATIVE: D-Dimer, Quant: 0.3 ug/mL-FEU (ref 0.00–0.48)

## 2010-12-04 LAB — PROTIME-INR: Prothrombin Time: 13.8 seconds (ref 11.6–15.2)

## 2010-12-04 LAB — ETHANOL: Alcohol, Ethyl (B): <5 mg/dL (ref 0–10)

## 2010-12-04 LAB — CORTISOL-AM, BLOOD: Cortisol - AM: 8.2 ug/dL (ref 4.3–22.4)

## 2011-01-06 NOTE — H&P (Signed)
NAME:  Mitchell Mccall, Mitchell Mccall                  ACCOUNT NO.:  [id number]   MEDICAL RECORD NO.:  [id number]          PATIENT TYPE:  INP   LOCATION:  3703                         FACILITY:  [facility]   PHYSICIAN:  Michiel Cowboy, MDDATE OF BIRTH:  May 07, 1951   DATE OF ADMISSION:  11/06/2008  DATE OF DISCHARGE:                              HISTORY & PHYSICAL   PRIMARY CARE Diamantina Edinger:  C. Duane Lope, M.D.   CHIEF COMPLAINT:  Abdominal pain.   HISTORY OF THE PRESENT ILLNESS:  The patient is a 60 year old gentleman  with a history of severe esophagitis and alcohol abuse in the past,  although he denies alcohol for the past 3 weeks, who for about 10 days  or so has had diffuse abdominal pain, somewhat more worse in the  epigastric region as well as a burning sensation in his substernal area.  He has had no diarrhea, but has had some nausea and vomiting; vomiting  of a foamy-like fluid, which gets better when he eats.  He actually  states that when he ate a burger he felt much better.  He also complains  of bilateral hand numbness or tingling sensation, which has improved  with Dilaudid.  The patient endorses that occasionally he has a  sensation like his breath is being caught in his chest and he cannot  inhale or exhale.  He cannot quite explain more about how this feels.   REVIEW OF SYSTEMS:  Otherwise on the review of systems he has had no  fevers, no chills, no diarrhea, no melena, and no hematochezia.  Otherwise the review of systems is unremarkable.   PAST MEDICAL HISTORY:  The past medical history is significant for:  1. Alcohol abuse.  2. Diabetes for which the patient is suppose to take insulin, but does      not.  3. Tobacco abuse.  4. History of seizure disorder.  5. Hypertension.  6. History of a remote subdural hematoma.  7. Chronic low-back pain.  8. Peptic ulcer disease.  9. Essential tremor.  10.Depression and anxiety.   SOCIAL HISTORY:  The patient states that he has not  drank for the past 3  weeks and he is being very persistent with this story.  He continues to  smoke about half-pack or so per day.  He lives at home with his wife.   FAMILY HISTORY:  The family history is noncontributory.   ALLERGIES:  No known drug allergies.   MEDICATIONS:  1. Carafate 1 gram four times a day.  2. Neurontin 300 mg three times a day.  3. Dilantin 300 mg at bedtime.  4. Cymbalta 20 mg twice daily.  5. Multivitamin.   PHYSICAL EXAMINATION:  VITAL SIGNS:  Temperature 98.1, blood pressure  146/80, pulse 107, respirations 20, and he is satting at 99% on room  air.  GENERAL APPEARANCE:  The patient appears currently to be in no acute  distress.  HEENT:  Head is atraumatic.  Somewhat dry mucous membranes.  LUNGS:  The lungs are with occasional crackles at the bases, but  otherwise are unremarkable.  HEART:  The heart has a regular rate and rhythm, somewhat rapid.  No  murmurs appreciated.  Abdomen:  The abdomen is diffusely tender, but more so in the right  lower quadrant; and, some epigastric tenderness, as well, is noted.  Abdomen is somewhat distended.  EXTREMITIES:  Lower extremities are without clubbing, cyanosis or edema.  NEUROLOGIC EXAMINATION:  Neurologically he appears to be intact.  No  tremors.  No evidence of withdrawal.  SKIN:  The skin is intact and normal.   LABORATORY DATA:  White blood cell count 8.1 and hemoglobin 13.1.  Sodium 128, potassium 3.1, creatinine 0.75, and blood sugar initially  was 526 and is now down to 408 being given glucommander.  LFTs are  within normal limits.  Albumin 3.3 and lipase is slightly elevated at  61.  Alcohol level less than 5.  Chest x-ray negative.  KUB  unremarkable.   ASSESSMENT AND PLAN:  This is a 60 year old gentleman with a history of  alcohol abuse and esophagitis who presents with fairly severe abdominal  pain.  So far the KUB is unremarkable and there is a normal white blood  cell count on the patient.   The patient is not febrile, but he does have  some epigastric as well as right lower quadrant tenderness; and, severe  hyperglycemia.  1. Abdominal pain.  This needs to be further worked up.  We will      obtain a computerized tomography of the abdomen and pelvis stat to      evaluate for any surgical causes.  We will also obtain this for      reasonably severe pain.  We will repeat the lipase in the morning;      it is very slightly elevated.  The patient's nausea is not worse      with food, but rather better, so that makes pancreatitis less      likely on presentation.  We will keep him nothing per os until the      results of the computerized tomographic scan are known.  2. Hypokalemia.  Replace.  3. Diabetes, uncontrolled.  We will start him on glucommander for now.      The blood sugar is already improving.  He probably needs to be on      insulin.  Per his wife he was prescribed insulin in the past, has      not been taking it.  We will check a hemoglobin A-1-C and check a      thyroid stimulating hormone.  4. Hyponatremia.  This could be dehydration-related versus pain-      related.  We will check urine electrolytes, thyroid stimulating      hormone, urine osmolality and cortisol level; and, will see if he      improves with gentle intravenous fluids.  5. Epigastric/substernal chest pain.  This is likely more      gastrointestinal-related, but given his risk factors we will cycle      cardiac enzymes and obtain an electrocardiogram.  Currently no      electrocardiogram is available; we will      obtain one if this was not done in the emergency department.  6. Prophylaxes.  Protonix and sequential compressive devices.  We will      continue his Carafate.  He may need gastrointestinal repeat consult      in the morning for abdominal pain and possibly worsening      esophagitis.  Michiel Cowboy, MD  Electronically Signed     AVD/MEDQ  D:  11/06/2008  T:   11/07/2008  Job:  [id number]   cc:   C. Duane Lope, M.D.

## 2011-01-06 NOTE — H&P (Signed)
NAME:  Mitchell Mccall, Mitchell Mccall NO.:  [id number]   MEDICAL RECORD NO.:  [id number]          PATIENT TYPE:  INP   LOCATION:  5118                         FACILITY:  [facility]   PHYSICIAN:  Gordy Savers, MDDATE OF BIRTH:  1951/02/03   DATE OF ADMISSION:  03/08/2009  DATE OF DISCHARGE:                              HISTORY & PHYSICAL   CHIEF COMPLAINT:  Weakness.   HISTORY OF PRESENT ILLNESS:  The patient is a 60 year old gentleman who  has a long history of chronic alcoholism.  He presents to the ED with a  3-day history of increasing weakness, nausea, abdominal pain, as well as  intermittent confusion.  He continues to consume alcohol daily and his  wife describes heavy use.  She states that he has had a history of prior  DTs, has also been admitted for rehab on 2 occasions.  The patient  apparently fell 2 days ago and does have a history of a prior subdural  hematoma.  ED evaluation included a head CT that revealed small-vessel  ischemic changes and brain atrophy only.  A CT of the cervical spine  revealed no acute pathology.  Laboratory evaluation revealed elevated  transaminases and a serum potassium of 3.1.  The patient has a history  of chronic convulsive disorder and Dilantin level was less than 2.5.  He  has type 2 diabetes and has been noncompliant in the past.  Laboratory  evaluation revealed normal lipase.  Bilirubin and alkaline phosphatase  were normal.  Prothrombin time was normal and a random blood sugar was  173.  The patient is now admitted for further evaluation and treatment  of his acute alcoholic hepatitis and to further manage his chronic  alcoholism.   PAST MEDICAL HISTORY:  The patient has a history of chronic alcoholism,  type 2 diabetes.  He was last hospitalized in March of this year for  duodenitis and esophagitis.  He has a chronic seizure disorder and  remote history of a subdural hematoma.  He has peptic ulcer disease,  history of  essential tremor, and chronic anxiety, depression.  He has  history of diverticulosis, hypertension, and ongoing tobacco use.   SOCIAL HISTORY:  The patient is a heavy daily drinker and has been  admitted to rehab facilities on 2 occasions, once at [facility] and  once in [place], [place].  His wife lives close by and checks  on him daily, but does not live in the same household.  The patient  smokes to his estimate one-half-pack of cigarettes daily.   ALLERGIES:  None.   PRESENT MEDICAL REGIMEN:  1. Carafate p.r.n.  2. Lantus insulin 10 units at bedtime.  3. Neurontin 300 mg t.i.d.  4. Dilantin 300 mg at bedtime.  5. Cymbalta 30 mg b.i.d.  6. Vicodin p.r.n.   FAMILY HISTORY:  Noncontributory.  Mother age 67 in a reasonably good  health.  Father died of suicide death at age 96.  Two brothers and 3  sisters, 1 sister with chronic alcoholism.   PHYSICAL EXAMINATION:  VITAL SIGNS:  Blood pressure  150/80, pulse rate  100, respiratory rate 16, O2 saturation 97% on room air.  GENERAL:  A well-developed intoxicated male who is alert and cooperative  for the examination.  He had cognitive impairment and referred to his  wife for answers to most questions.  HEENT:  Examination of the head revealed an abrasion in the occipital  area.  No hematoma.  HEENT exam revealed conjunctival injection, but no  scleral icterus.  ENT normal.  Oropharynx benign.  NECK:  Supple.  No adenopathy.  No neck vein distention.  No bruits  appreciated.  CHEST:  Clear.  CARDIOVASCULAR:  Normal S1 and S2.  No murmurs.  ABDOMEN:  Tender hepatomegaly.  Bowel sounds were active.  EXTERNAL GENITALIA:  Normal.  EXTREMITIES:  Mild digital clubbing.  Pedal pulses were intact, perhaps  slightly diminished on the left.  NEURO:  Unremarkable except for his mild confusion due to his  intoxicated state.  Exam was nonfocal.  There is no asterixis.   IMPRESSION:  1. Alcoholic hepatitis.  2. Alcoholic liver  disease.  3. Hypokalemia.   ADDITIONAL DIAGNOSES:  1. Chronic alcoholism with history of delirium tremens nd failed      alcoholic rehabilitation x2.  2. Hypertension.  3. Seizure disorder.  4. Medical noncompliance.  5. Type 2 diabetes.  6. History of peptic ulcer disease.   DISPOSITION:  We will admit the patient to the hospital and support with  IV fluids.  He will be given thiamine and multivitamins.  He will be  treated with Ativan protocol.  He will also be placed on a sliding scale  regimen of short-acting insulin.  Potassium will be replaced orally and  intravenously.  The patient will also be loaded on oral Dilantin and  resumed on his daily bedtime dose.  The patient will be evaluated for  ongoing alcoholic rehab when medically stable.      Gordy Savers, MD  Electronically Signed     PFK/MEDQ  D:  03/09/2009  T:  03/09/2009  Job:  [id number]

## 2011-01-06 NOTE — Op Note (Signed)
NAME:  Mitchell Mccall, Mitchell Mccall NO.:  [id number]   MEDICAL RECORD NO.:  [id number]          PATIENT TYPE:  INP   LOCATION:  2020                         FACILITY:  [facility]   PHYSICIAN:  Petra Kuba, M.D.    DATE OF BIRTH:  1951/05/14   DATE OF PROCEDURE:  06/27/2008  DATE OF DISCHARGE:                               OPERATIVE REPORT   PROCEDURE:  Esophagogastroduodenoscopy.   INDICATIONS:  Abdominal pain and chest pain.  Nausea, vomiting, abnormal  CAT scan.  Consent was signed after risks, benefits, methods, and  options were thoroughly discussed multiple times in the past.   MEDICINES USED:  Fentanyl 60 mcg and Versed 7 mg.   PROCEDURE:  The video endoscope was inserted by direct vision.  He had  severe ulcerative esophagitis and was completely throughout his  esophagus.  He did have a tiny small hiatal hernia.  Scope passed into  the stomach and advanced through a normal antrum, normal pylorus, into  the duodenal bulb.  Pertinent for a moderate amount of inflammation and  bulbitis and multiple small shallow ulcerations.  The C-loop had a  moderate amount of inflammation.  The second portion of the duodenum had  a minimal amount of inflammation.  No other abnormalities were seen.  Scope was withdrawn back to the bulb.  The area was washed and watched,  which confirmed the above findings.  Scope was withdrawn back to the  stomach and retroflexed.  Cardia, fundus, angularis of lesser and  greater curve were all normal on retroflex visualization.  Straight  visualization of the stomach did not reveal any additional findings.  The patient did not tolerate the endoscopy well, so we elected not to  take any biopsies of the stomach to rule out Helicobacter.  On slow  withdrawal through the esophagus, a severe ulcerative esophagus was  confirmed.  We elected not to biopsy that at this time again due to  patient's agitation and the severity of the findings.  Scope was  removed.   The patient tolerated the procedure fair.  There was no  obvious immediate complication.   ENDOSCOPIC DIAGNOSES:  1. Severe ulcerative esophagitis with a tiny small hiatal hernia.  2. Moderate bulbitis and shallow bulb ulcers.  3. Minimal distal duodenitis.   PLAN:  Check H. pylori serology, b.i.d. pump inhibitors.  We will add  Carafate.  Continue Reglan as you were doing.  Need recheck endoscopy in  2 months to document healing.  No alcohol, aspirin, or nonsteroidals,  and  decided in a few months based on his symptom whether he needs [facility] to evaluate his pancreatic stones, but  probably based on these endoscopy findings most if not all of his  symptoms are coming from the reflux in his stomach.           ______________________________  Petra Kuba, M.D.     MEM/MEDQ  D:  06/27/2008  T:  06/28/2008  Job:  [id number]   cc:   C. Duane Lope, M.D.  James L. Malon Kindle., M.D.

## 2011-01-06 NOTE — Discharge Summary (Signed)
NAME:  Mitchell Mccall, Mitchell Mccall                  ACCOUNT NO.:  [id number]   MEDICAL RECORD NO.:  [id number]          PATIENT TYPE:  INP   LOCATION:  5118                         FACILITY:  [facility]   PHYSICIAN:  Corinna L. Lendell Caprice, MDDATE OF BIRTH:  08-Sep-1950   DATE OF ADMISSION:  03/09/2009  DATE OF DISCHARGE:  03/11/2009                               DISCHARGE SUMMARY   DISCHARGE DIAGNOSES:  1. Alcoholic hepatitis.  2. Hypokalemia secondary to alcohol abuse.  3. Chronic alcoholism with history of alcohol withdrawal.  4. Hypertension.  5. Seizure disorder.  6. Medical noncompliance.  7. Type 2 diabetes.  8. History of peptic ulcer disease.   DISCHARGE MEDICATIONS:  Ativan 1 mg daily for 3 days.  Continue Lantus  10 units subcutaneously nightly, Carafate 3 times a day, Dilantin 300 mg  nightly, Cymbalta 20 mg twice a day, and multivitamin a day.   CONDITION:  Stable.   ACTIVITY:  Increase slowly.   FOLLOWUP:  With Dr. Tenny Craw next week.   CONSULTATIONS:  None.   PROCEDURES:  None.   DIET:  Cardiac.   LABORATORY DATA:  CBC unremarkable.  Basic metabolic panel on admission  significant for potassium of 3.1, repleted at discharge.  Liver function  tests significant for an AST of 369, ALT of 126, otherwise unremarkable.  At discharge, his AST was 99, ALT 81.  Magnesium, lipase, phosphorus  normal.  Hemoglobin A1c 8.8.  Admission phenytoin level less than 2.5.  On 18th, his phenytoin level was 13.6.  Urinalysis:  Negative nitrite,  negative leukocyte esterase, negative protein, 15 ketones, negative  glucose, trace hemoglobin, 3-6 red cells, rare bacteria.  Urine culture  negative.   SPECIAL STUDIES/RADIOLOGY:  CT of the brain showed motion degradation,  chronic small vessel ischemic change, and brain atrophy.  CT of the C-  spine showed nothing acute, cervical spondylosis.  Acute abdominal  series on admission showed no acute cardiopulmonary disease and normal  bowel gas pattern.  CT of  the abdomen and pelvis with contrast showed  mild distal esophageal wall thickening persists, liver with fatty  infiltration, nothing acute.   HISTORY AND HOSPITAL COURSE:  Mitchell Mccall is a 60 year old white male with  history of alcohol abuse and dependence who presented with weakness.  He  had nausea, abdominal pain, and confusion, continued daily alcohol  consumption.  He has a history of withdrawal and previous history of  subdural hematoma.  The patient fell 2 days prior to admission, but CAT  scan revealed nothing acute.  He had a blood pressure of 150/80 on  admission, heart rate 100, otherwise normal vital signs.  He was  intoxicated on admission, was alert and cooperative.  He had a nonfocal  exam and no asterixis.  He was admitted for alcohol detoxification,  correction of his electrolyte disturbances and treatment of his  weakness.  The patient was given counseling regarding abstinence from  alcohol.  He was noted to have a negligible phenytoin level.  His  phenytoin was resumed.  He was discharged by Dr. Rito Ehrlich on March 11, 2009.  He had no signs of alcohol withdrawal.  He was feeling better and  stable for discharge.      Corinna L. Lendell Caprice, MD  Electronically Signed     CLS/MEDQ  D:  04/23/2009  T:  04/24/2009  Job:  [id number]

## 2011-01-06 NOTE — H&P (Signed)
NAME:  Mitchell Mccall, Mitchell Mccall NO.:  [id number]   MEDICAL RECORD NO.:  [id number]          PATIENT TYPE:  EMS   LOCATION:  MAJO                         FACILITY:  [facility]   PHYSICIAN:  Ramiro Harvest, MD    DATE OF BIRTH:  04/09/1951   DATE OF ADMISSION:  06/25/2008  DATE OF DISCHARGE:                              HISTORY & PHYSICAL   PRIMARY CARE PHYSICIAN:  Dr. Duane Lope, [facility] physicians.   HISTORY OF PRESENT ILLNESS:  Mitchell Mccall is a 60 year old white male with  a history of seizures, alcohol abuse, hypertension and subdural  hematoma, who presented to the ED with a 4-day history of coffee-ground  emesis, decreased p.o. intake, upper abdominal pain, felt secondary to  his emesis, bilateral chest pain lasting approximately 5 minutes with no  radiation, also occurring after emesis, some palpitations, clamminess,  chills, subjective fevers, generalized weakness, dizziness and  lightheadedness, as well as some polydipsia and a productive cough of  clear phlegm.  The patient denies any shortness of breath.  No  paroxysmal nocturnal dyspnea, no orthopnea, no focal neurological  symptoms, no melena, no hematochezia, no hematemesis, no dysuria, no  diarrhea, no constipation.  No other associated symptoms.  The patient  denies any recent alcohol use, states that his last alcohol use was  about 4-5 days.  The patient presented to the Urgent Care and was sent  to the ED.  In the ED, the patient had labs obtained.  CMET had a sodium  of 133, potassium of 3.3, chloride 92, bicarb 16, BUN 23, creatinine  1.07, glucose of 288, anion gap of 23, calcium of 9.5, bilirubin 2.2,  alk phosphatase 68, AST 34, ALT 46, protein 7.4, albumin 4.0.  CBC -  white count 19.3, hemoglobin 17.9, platelets 185, hematocrit 52.0, MCV  of 106.8, ANC of 16.3, lipase level 24.  ABG - pH of 7.39, pCO2 of 28,  pO2 of 103, bicarb of 17.  Acute abdominal series showed no evidence of  bowel obstruction, no  pleural effusion, no pulmonary edema, no airspace  disease.  Heart was normal.  We were called to admit the patient for  further evaluation and recommendations.   ALLERGIES:  NO KNOWN DRUG ALLERGIES.   PAST MEDICAL HISTORY:  1. History of seizures with multiple episodes, last episode Jan 07, 2006.  2. History of alcohol abuse.  3. History of Dilantin toxicity.  4. Subdural hematoma in July 2006.  5. Hypertension.  6. Chronic low back pain.  7. Peptic ulcer disease.  8. History of surgery to the right arm to release a nerve.  9. Essential tremor.  10.Depression/anxiety.   HOME MEDICATIONS:  The patient has not taking any medications in 8  weeks.  The last list that he had 8 weeks ago was;  1. Neurontin 300 mg p.o. t.i.d.  2. Dilantin 300 mg p.o. nightly.  3. Cymbalta 20 mg p.o. b.i.d.   However, the patient has not had any medications in approximately 2  months.   SOCIAL HISTORY:  The patient is married,  [place]  Positive tobacco history.  Positive alcohol history.  Drinks  about 2-3 liquor drinks two to three times per week.  No IV drug use.  The patient has three children, all of whom are healthy.   FAMILY HISTORY:  Father deceased age 63 from suicide.  Mother alive age  45 and healthy.  He has three sisters and two brothers, one sister with  alcohol abuse.  The rest of his siblings are healthy.   REVIEW OF SYSTEMS:  As per HPI, otherwise negative.   PHYSICAL EXAMINATION:  VITAL SIGNS:  Temperature 97.5, blood pressure  148/88, pulse of 118, respiratory rate 22.  Satting 100% on room air.  GENERAL:  Patient in no apparent distress, laying on gurney.  HEENT:  Normocephalic, atraumatic.  Pupils equal, round and reactive to  light and accommodation.  Extraocular movements intact.  Oropharynx is  clear.  No lesions, no exudates.  NECK:  Supple.  No lymphadenopathy.  RESPIRATORY:  Lungs are clear to auscultation bilaterally.  No wheezes,   no rhonchi, no crackles.  CARDIOVASCULAR:  Tachycardiac.  Regular  rhythm.  No murmurs, rubs or gallops.  ABDOMEN:  Soft, nondistended.  Positive bowel sounds.  Tenderness to  palpation in the epigastric region and suprapubic region.  No rebound or  guarding.  EXTREMITIES:  No clubbing, cyanosis or edema.  NEUROLOGICAL:  The patient is alert and oriented x3.  Cranial nerves II-  XII are grossly intact.  No focal deficits.   LABORATORY DATA:  EKG shows sinus tachycardia with a rate of 133.  Sodium 131, potassium 3.3, chloride 92, bicarb 16, BUN 23, creatinine  1.07, glucose of 288, bilirubin 2.2, alk phosphatase 68, AST 34, ALT 46,  protein 7.4, albumin 4.0, calcium of 9.5, anion gap of 23.  CBC - white  count 19.3, hemoglobin 17.9, platelets of 185, hematocrit 52.0, ANC of  16.3, MCV of 106.8, lipase of 24, pH of 7.39, pCO2 of 28, pO2 of 103,  bicarb of 17.  Acute abdominal series - no pleural effusion or pulmonary  edema.  No airspace disease.  Heart size is normal.  No evidence of  bowel obstruction.   ASSESSMENT AND PLAN:  Mitchell Mccall is a 60 year old gentleman with history  of alcohol abuse and history of seizure disorder, who presents to the  emergency department with nausea and vomiting over the past 4 days and  found to be in mild diabetic ketoacidosis.   1. Mild diabetic ketoacidosis, questionable etiology.  This is a new      onset diabetes.  Differential includes infection versus      ischemia/infarct versus inflammatory versus intoxication.  The      patient does have an anion gap of 23 with metabolic acidosis.  Will      cycle cardiac enzymes q.8 h., x3, check serum ketones, check blood      cultures x2, check urinalysis with cultures and sensitivities,      check alcohol level, check amylase level, check magnesium, check      hemoglobin A1c.  Will check BMET q.2 h., and check CBCs q.1 h.      Will place on IV fluid hydration and Glucommander.  Once CBGs are      less than  250, will place on D5-1/2 normal saline until the anion      gap closes, then overlap with subcutaneous insulin for 1-2 hours      before turning off the insulin  drip.  Will also place on empiric      antibiotics of Rocephin and will follow.  2. New onset diabetes mellitus.  Check a hemoglobin A1c.  See problem      #1.  3. Coffee ground emesis.  The patient does have a history of gastric      ulcers and alcohol abuse.  Will type and cross for 2 units of      packed red blood cells.  Will follow hemoglobin and hematocrit.      Will place on Protonix 40 mg IV q.12 h., and will likely need a GI      consult in the morning for further evaluation and recommendations      for possible endoscopy.  4. Dehydration.  Hydrate with IV fluids.  5. Hypokalemia.  Replete.  Check a magnesium level.  6. Leukocytosis, questionable etiology.  Check a urinalysis with      cultures and sensitivities.  Check blood cultures x2.  Will place      on empiric coverage with IV Rocephin until cultures return.  7. Seizures.  Check a Dilantin level, place on Dilantin 300 mg IV      daily and seizure precautions.  8. Alcohol abuse.  Check an alcohol level.  Monitor for delirium      tremens.  Place on thiamine and folate.  9. History of subdural hematoma, stable.  10.Hypertension.  11.Chronic low back pain.  12.Prophylaxis; Protonix for gastrointestinal prophylaxis.  Sequential      compression devices for deep venous thrombosis prophylaxis.   It has been a pleasure taking care of Mr. Mitchell Mccall.      Ramiro Harvest, MD  Electronically Signed     DT/MEDQ  D:  06/25/2008  T:  06/25/2008  Job:  [id number]   cc:   C. Duane Lope, M.D.

## 2011-01-06 NOTE — Discharge Summary (Signed)
NAME:  Mitchell Mccall, Mitchell Mccall                  ACCOUNT NO.:  [id number]   MEDICAL RECORD NO.:  [id number]          PATIENT TYPE:  INP   LOCATION:  3703                         FACILITY:  [facility]   PHYSICIAN:  Kela Millin, M.D.DATE OF BIRTH:  04-Sep-1950   DATE OF ADMISSION:  11/06/2008  DATE OF DISCHARGE:  11/10/2008                               DISCHARGE SUMMARY   DISCHARGE DIAGNOSES:  1. Esophagitis and duodenitis.  2. Uncontrolled diabetes mellitus - secondary to medication      noncompliance.  3. Hyponatremia.  4. History of alcohol abuse.  5. History of seizure disorder.  6. History of remote subdural hematoma.  7. History of chronic low back pain.  8. History of peptic ulcer disease.  9. History of essential tremor.  10.History of depression and anxiety.  11.History of sigmoid diverticulosis.  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  13.Hypokalemia - resolved.   PROCEDURES AND STUDIES:  CT scan of abdomen and pelvis - distal  esophageal wall thickening.  Stable pancreatic ductal calcification and  small pancreatic head cystic lesion.  Stable atherosclerotic disease  involving the aorta without aneurysm or dissection.  Fold thickening in  the duodenum may suggest duodenitis.  No discrete ulcer.   BRIEF HISTORY:  The patient is a 60 year old white male with the above  listed medical problems as well as severe esophagitis and duodenitis in  the past, and alcohol abuse in the past.  It was stated that he had not  had any alcohol for 3 weeks prior to presentation, but had been having  diffuse abdominal pain, worse in the gastric area and described as a  burning sensation.  He had also had nausea and sitting up a lot -  increased sputum production.  He stated that eating actually made the  symptoms better.  He was admitted for further evaluation and management.   Please see the full admission  history and physical dictated on November 06, 2008 by Dr. Adela Glimpse for the details of the admission physical exam as  well as the laboratory data.   HOSPITAL COURSE:  1. Esophagitis and duodenitis - as discussed above, it was noted that      the patient had had an EGD done in November 2009 by Dr. Ewing Schlein which      showed severe ulcerative esophagitis as well as minimal distal      duodenitis.  The patient had not been compliant with all his      medications.  He was restarted on his PPI and also the Carafate and      Reglan as had been previously recommended by Dr. Ewing Schlein.  After this      was done, the patient's symptoms quickly improved.  He is      tolerating a bland diet at this time.  His CT scan of his abdomen      and pelvis was done and the results are stated above showing the      distal esophageal  wall thickening, likely still due to esophagitis      and possible duodenitis.  His abdominal pain has resolved and again      he is tolerating p.o. well.  He has been instructed to continue      taking these medications upon discharge and he is to follow up with      his primary care physician and with Dr. Ewing Schlein.  He is also to avoid      alcohol.  2. Uncontrolled diabetes mellitus - he was supposed to be taking      insulin, but he had not been doing so.  His blood sugars were noted      initially to be 526 and improved to 408.  He was initially placed      on IV insulin for a glucose stabilizer and subsequently      transitioned as per protocol to Lantus.  He has been instructed on      how to administer his Lantus and he already has a Glucometer at      home and he is to check his blood sugars and he states that he will      give himself the Lantus as directed this time and follow up with      his primary care physician - his wife was at the bedside.  3. Hypokalemia - his potassium was replaced in the hospital.  4. Hyponatremia - improved with hydration and as his blood sugars were       better controlled.  5. History of seizures - the patient was maintained on his Dilantin      during his hospital stay and remained seizure-free.  6. History of depression - He was maintained on his Cymbalta during      his hospital stay.  7. History of tobacco abuse - patient counseled to quit tobacco.   DISCHARGE MEDICATIONS:  1. Lantus 10 units subcu q.h.s.  2. Reglan 5 mg p.o. q.a.c.  3. Prilosec 40 mg one p.o. b.i.d.  4. Thiamine 100 mg p.o. daily.  5. Patient to continue Carafate 1 gram q.i.d.  6. Neurontin 300 mg t.i.d.  7. Dilantin 300 mg q.h.s.  8. Cymbalta 20 mg b.i.d.  9. Multivitamin daily.   FOLLOW-UP CARE:  1. Dr. Duane Lope in 2-3 weeks.  The patient is to call for an      appointment.  2. Dr. Ewing Schlein in 3-4 weeks, the patient is to call for an appointment.   DISCHARGE CONDITION:  Improved/Stable.      Kela Millin, M.D.  Electronically Signed     ACV/MEDQ  D:  11/10/2008  T:  11/10/2008  Job:  [id number]   cc:   Miguel Aschoff, M.D.  Petra Kuba, M.D.

## 2011-01-06 NOTE — Consult Note (Signed)
NAME:  Mitchell Mccall, Mitchell Mccall NO.:  [id number]   MEDICAL RECORD NO.:  [id number]          PATIENT TYPE:  INP   LOCATION:  2020                         FACILITY:  [facility]   PHYSICIAN:  Petra Kuba, M.D.    DATE OF BIRTH:  04-Oct-1950   DATE OF CONSULTATION:  DATE OF DISCHARGE:                                 CONSULTATION   We were asked to see Mr. Scheck today in consultation for pancreatic duct  stone and dilatation by Dr. Donnalee Curry.  Today's date is June 26, 2008.   HISTORY OF PRESENT ILLNESS:  This is a 60 year old male with a history  of chronic pancreatitis who was admitted in mild DKA.  He reports 4 days  of hematemesis prior to admission.  He states that he developed  epigastric pain and hiccups and then vomited coffee-colored liquid.  This vomiting increased in frequency on Thursday prior to admission, and  he has been unable to eat or drink anything since last Thursday.  The  patient reports pain with swallowing and feels like he has a knot in his  esophagus.  Food goes down easily but then returns fairly quickly.  He  had no symptoms of dysphagia or epigastric pain prior to 2 weeks ago.  He denies excessive NSAID.  He says that he takes ibuprofen occasionally  for knee pain.   Past medical history is significant for seizures alcohol abuse,  hypertension, subdural hematoma, peptic ulcer disease, right arm  surgery, essential tremor, depression, and anxiety.  He has been a  borderline diabetic for over 10 years.  His primary care physician is  Dr. Duane Lope.   Current medications are none.  He has not been on any medications for  the last 8 weeks.  He weaned them and says he did not need them anymore.  Prior to 8 weeks ago, his medications included Dilantin, Cymbalta, and  Neurontin.   He has no known drug allergies.   Review of systems is significant for 10-pound weight loss in 2 years.   Past social history is positive for alcohol.  He says he  drinks  approximately 2 alcoholic beverages 3 times a week.  He also smokes  approximately 6 cigarettes a day, says that is down from 2 packs a day.   Family history is negative for pancreatic cancer, pancreatitis, or colon  cancer.   On physical exam, he is alert and oriented in no apparent distress.  He  does have the hiccups.  Currently, his temperature is 97.8, pulse 91,  respirations 17, blood pressure is 132/49.  Heart has a regular rate and  rhythm.  Lungs are clear to auscultation.  Abdomen is soft, nontender,  nondistended with good bowel sounds.   Labs show a potassium of 3.1, glucose of 46, white count is 13.5,  hemoglobin is 14.9 that is down from 17.8 on admission, hematocrit is  42.4, platelets are 141,000.  His MCV value is 106.  LFTs are normal  with the exception of his total bilirubin, which is 2.2.   CT scan of  his abdomen and pelvis done today shows:  1. Moderate distal esophageal wall thickening.  2. Mild-to-moderate pancreatic ductal dilatation to the level of      pancreatic duct stone, which is 7 mm in diameter.  3. Sigmoid diverticulosis.  4. Age-advanced vascular calcifications.  Previously in May of 2006,      he had an abdominal ultrasound that showed pancreatic ductal      dilatation to 3-4 mm.   ASSESSMENT:  Dr. Vida Rigger has seen and examined the patient, collected  his history and reviewed his chart.  His impression is this is a 57-year-  old male with multiple medical problems including chronic alcohol abuse,  chronic tobacco abuse, chronic pancreatitis with calcifications, and  pancreatic ductal dilatation.  More acute problems include his  hematemesis and esophageal wall thickening.  We will plan to begin our  evaluation with an upper endoscopy on June 27, 2008, to evaluate his  esophageal wall thickening and source of hematemesis.  I agree with  Protonix, Reglan, and Phenergan as well as Rocephin.  We will follow  with you.   Thanks very  much for this consultation.      Stephani Police, PA    ______________________________  Petra Kuba, M.D.    MLY/MEDQ  D:  06/26/2008  T:  06/27/2008  Job:  [id number]   cc:   Fayrene Fearing L. Malon Kindle., M.D.

## 2011-01-09 NOTE — Consult Note (Signed)
NAME:  Mitchell Mccall, Mitchell Mccall NO.:  [id number]   MEDICAL RECORD NO.:  [id number]          PATIENT TYPE:  INP   LOCATION:  2314                         FACILITY:  [facility]   PHYSICIAN:  Cristi Loron, M.D.DATE OF BIRTH:  Apr 07, 1951   DATE OF CONSULTATION:  03/05/2005  DATE OF DISCHARGE:                                   CONSULTATION   CHIEF COMPLAINT:  Seizure.   HISTORY OF PRESENT ILLNESS:  Patient is a 60 year old white male with who  reportedly has a history of seizures.  He had a seizure last night around 9  p.m. while sitting in a recliner.  He was brought to [facility] and  worked up by Dr. Cathren Laine with a cranial CT scan which demonstrated a  small right subdural hematoma.  The patient was admitted by the medical  service and a neurosurgical consultation was kindly requested.   PAST MEDICAL HISTORY:  1.  Gastric ulcers.  2.  History of ethanol abuse.   MEDICATIONS PRIOR TO ADMISSION:  Neurontin and Aciphex.   PAST SURGICAL HISTORY:  Surgeries on right arm.   No known drug allergies.   FAMILY HISTORY:  Noncontributory.   SOCIAL HISTORY:  The patient smokes one half pack per day of cigarettes.  He  lives with his sister.  He denies ethanol and drug abuse currently but  according to the chart there is a history of ethanol abuse.   REVIEW OF SYSTEMS:  Unobtainable as the patient is not cooperative enough to  answer my questions accurately.   PHYSICAL EXAMINATION:  GENERAL:  A somnolent, but agitated when aroused 32-  year-old white male.  VITAL SIGNS:  Blood pressure 150/60, heart rate 90, respiratory rate 18,  saturations 98% on room air, temperature 98.5 degrees orally.  HEENT:  Normocephalic.  Pupils OS, 2 mm OD, 3 mm reactive OU.  Extraocular  movements are intact.  Oropharynx benign.  NECK:  Supple.  No masses, deformities.  He has normal range of motion.  Thorax symmetric.  HEART:  Regular rhythm.  ABDOMEN:  Soft, nontender.  EXTREMITIES:  No obvious deformities.  NEUROLOGIC:  The patient is alert, but only oriented x1 (to person).  Glasgow coma scale 14 (E4, M6, V4).  Patient's cranial nerves II-XII are  examined bilaterally and grossly normal with a somewhat limited examination  because the patient was not fully cooperative.  Vision and hearing grossly  normal bilaterally.  Motor strength 5/5 in his biceps, triceps, hand grip,  quadriceps, gastrocnemius, extensor hallux longus.  Deep tendon reflexes are  symmetric.  There is no ankle clonus.  Sensory examination is intact in  light touch and all tested dermatomes bilaterally.  Cerebellar function is  intact to rapid alternating movements bilaterally.   IMAGING STUDIES:  I have reviewed the patient's cranial CT scan performed at  [facility].  It demonstrates that patient has a small chronic right  subdural hematoma with some mild mass effect.  Cervical CT demonstrated some  spondylosis degenerative changes, but no acute fractures.   ASSESSMENT/PLAN:  1.  Seizures.  It sounds like the patient has a known seizure disorder and      therefore I would continue with medical management.  Of course, ethanol      abuse does not help.  2.  Small right frontal subdural hematoma.  As above, it is relatively      small, has a mild mass effect and therefore I do not think he needs      surgery any time soon on this.  This can be observed with serial CT      scans and if it was to significantly enlarge then surgery would be      warranted.  I will follow the patient on an outpatient basis.  Please      have him follow up with me in the office a week or two after his      discharge from the hospital.      Cristi Loron, M.D.  Electronically Signed     JDJ/MEDQ  D:  03/05/2005  T:  03/05/2005  Job:  [id number]

## 2011-01-09 NOTE — Op Note (Signed)
   NAME:  Mitchell Mccall, Mitchell Mccall                            ACCOUNT NO.:  [id number]   MEDICAL RECORD NO.:  [id number]                   PATIENT TYPE:  AMB   LOCATION:  ENDO                                 FACILITY:  [facility]   PHYSICIAN:  James L. Malon Kindle., M.D.          DATE OF BIRTH:  12-19-1950   DATE OF PROCEDURE:  03/23/2003  DATE OF DISCHARGE:                                 OPERATIVE REPORT   PROCEDURE:  Colonoscopy and coagulation of polyp.   MEDICATIONS:  1. Fentanyl 175 mcg.  2. Versed 14 mg IV.   INDICATION:  Rectal bleeding, a 60 year old gentleman.   DESCRIPTION OF PROCEDURE:  The procedure had been explained to the patient  and consent obtained.  With the patient in the left lateral decubitus  position, the pediatric adjustable colonoscope was inserted and advanced.  The patient had a long, tortuous colon.  Using abdominal pressure and  position changes, we were able to advance to the cecum.  Ileocecal valve and  appendiceal orifice seen.  The scope withdrawn, and the cecum, ascending  colon, transverse colon, descending and sigmoid colon seen well, and no  significant diverticulosis.  No polyps are seen.  The scope is withdrawn to  the rectum, and there less than 3-4 mm sessile polyp just inside the anal  verge.  In a retroflexed view, the patient was seen to have large internal  hemorrhoids and a large skin tag in the anal canal.  The 3-4 mm sessile  polyp in the rectum was cauterized with the hot biopsy forceps.  The scope  was withdrawn.  The patient tolerated the procedure well.   ASSESSMENT:  1. Rectal polyp, cauterized.  We will need to get path back before making     any recommendations.  2. Internal hemorrhoids, probably the source of his bleeding.   PLAN:  1. Routine postpolypectomy instructions.  2. We will give a hemorrhoid instruction sheet and see back in the office in     two months.                                               James L. Malon Kindle.,  M.D.    Waldron Session  D:  03/23/2003  T:  03/23/2003  Job:  [id number]   cc:   Sharlet Salina, M.D.  [address]  [place]  [place] [address]  Fax: [phone]

## 2011-01-09 NOTE — Procedures (Signed)
REFERRING PHYSICIAN:  Marlan Palau, M.D.   CLINICAL HISTORY:  A 60 year old male being evaluated for seizures.   MEDICATIONS:  Not listed.   This is a 17-channel EEG performed during wakeful state and drowsy states.   Background rhythm consists of 9-hertx alpha which is symmetric and  reactiveopening and closure. No paroxysmal epileptiform activity, spikes, or  sharp waves are seen. Length of the tracing is 24.5 minutes. Definitive  sleep changes are not achieved except mild drowsiness which is uneventful.  Hyperventilation and photic stimulation are both unremarkable.   IMPRESSION:  This electroencephalogram is performed during the wakeful and  drowsy states and within normal limits. No definitive epileptiform activity  is seen. If seizures are strongly suspected, a sleep deprived EEG may be  beneficial.           ______________________________  Sunny Schlein. Pearlean Brownie, MD     RKY:HCWC  D:  01/08/2006 18:31:06  T:  01/09/2006 15:47:48  Job #:  [id number]

## 2011-01-09 NOTE — Discharge Summary (Signed)
NAME:  Mitchell Mccall, Mitchell Mccall NO.:  [id number]   MEDICAL RECORD NO.:  [id number]                   PATIENT TYPE:  INP   LOCATION:  0349                                 FACILITY:  [facility]   PHYSICIAN:  Deirdre Peer. Polite, M.D.              DATE OF BIRTH:  08-09-1951   DATE OF ADMISSION:  07/23/2002  DATE OF DISCHARGE:  07/26/2002                                 DISCHARGE SUMMARY   PRIMARY CARE PHYSICIAN:  Sharlet Salina, M.D.   DISCHARGE DIAGNOSES:  1. Multilobular pneumonia.  2. Elevated d-dimer on admission.  CT of the chest and bilateral lower     extremities negative.  3. Hypokalemia, resolved.  4. Macrocytosis.  5. Hypertension, controlled.   DISCHARGE MEDICATIONS:  1. Tequin 400 mg daily for 12 more days.  2. Humibid LA 600 mg one every six hours.  3. Tylenol #3 two tablets every six hours as needed for pain.  4. Combivent multidose inhaler two puffs every six hours for three days,     then as needed.   CONSULTATIONS:  None this hospitalization.   PROCEDURES:  1. Chest x-ray on July 23, 2002, revealed:     a. A right upper lobe pneumonia.     b. Probable pneumonia in lingular with a somewhat nodular configuration.        Follow-up chest  radiographs are recommended two weeks after the        patient's symptoms resolve to exclude persistent nodularity in this        area.     c. Mild bronchitic changes.  2. A CT of his chest with contrast, July 23, 2002, revealed     consolidation and air space disease within the inferior right upper lobe     with scattered patchy areas of air space opacity within the right middle     lobe and bilateral lower lungs.  This likely represents pneumonia and     recommend a follow-up chest x-ray.  No evidence of pulmonary embolism.  3. CT of the lower extremities, July 23, 2002.  No evidence of DVTs.  4. EKG, July 23, 2002, sinus tachycardia, ventricular rate 136.   LABORATORY DATA:  Sodium 137,  potassium 3.7, chloride 104, CO2 26, glucose  113, BUN 12, creatinine 0.9.  D-dimer 0.53.  WBC 8.9, hemoglobin 14.4,  hematocrit 41.4, MCV 103.2, platelets 162.   BRIEF HISTORY AND PHYSICAL:  This is a 60 year old man who presented to  [facility] Emergency Room with a three-day history of fever, chills, right-  sided pleuritic chest pain, decreased appetite, and vomiting.  The patient  stated symptoms were similar to the flu he had experienced in the past.  Initial evaluation noted a low grade temperature at 100.5.  He was  tachycardic.  Heart rate was 147.  O2 saturations were 98% on room air.  Lungs were initially clear.  WBC count was elevated at 20.3.  D-dimer was  elevated at 0.53.  Potassium was low at 2.7.  Chest x-ray and CT of the  chest as noted above.  The patient was admitted for further evaluation and  treatment.    HOSPITAL COURSE:  1. MULTILOBULAR PNEUMONIA:  Again chest x-ray and CT are as above.  The     patient was admitted, started on IV, Zithromax, and Rocephin.  By     hospitalization day #1 the patient stated he was feeling much better.  He     was eating.  He continued to have a low grade temperature.  WBC was still     elevated at 29.2.  O2 saturation was 98% on room air.  IV antibiotics     were continued.  He was then switched to p.o. antibiotics.  On the day of     discharge WBC was back to normal.  Lung sounds were clear.  He was     tolerating p.o. antibiotics.  He has been discharged with an additional     12 days of antibiotics along with Combivent inhaler.  He has been     instructed to use incentive spirometry every 1-2 hours.  The patient     should have a follow-up chest x-ray in approximately one month to follow     up possible nodularity.   1. ELEVATED D-DIMER ON ADMISSION:  Initially d-dimer was elevated at 0.53.     CT of the chest was negative.  CT of bilateral lower extremities was     negative.   1. HYPOKALEMIA:  The patient was admitted to  a telemetry unit and had no     arrhythmias during his hospitalization.  Most likely secondary to     vomiting.  Potassium was replaced IV and p.o.  By hospitalization day #1     the patient's potassium was back to normal at 3.5.  He had no further     episodes of vomiting.   1. HYPERTENSION:  Was controlled throughout this hospitalization.  The     patient was on no medications prehospitalization.  Discharge blood     pressure 139/65.  Further evaluation and treatment to be left to his     primary care physician.   1. MACROCYTOSIS:  The patient's MCV was noted to be elevated at 103.2.     Hemoglobin though is stable at 14.4.   1. TOBACCO ABUSE:  The patient has been instructed to quit smoking.   DISCHARGE INSTRUCTIONS:   FOLLOW UP:  The patient has a follow-up appointment with Dr. Crista Luria  on July 31, 2002, and should have a follow-up chest x-ray in one month.        Stephanie Swaziland, NP                      Deirdre Peer. Polite, M.D.    SJ/MEDQ  D:  07/26/2002  T:  07/26/2002  Job:  [id number]   cc:   Sharlet Salina, M.D.  [address]  [place] [address]  Fax: [phone]

## 2011-01-09 NOTE — Discharge Summary (Signed)
NAME:  Mitchell Mccall, Mitchell Mccall                            ACCOUNT NO.:  [id number]   MEDICAL RECORD NO.:  [id number]                   PATIENT TYPE:  INP   LOCATION:  3705                                 FACILITY:  [facility]   PHYSICIAN:  Hollice Espy, M.D.            DATE OF BIRTH:  Jun 19, 1951   DATE OF ADMISSION:  01/23/2004  DATE OF DISCHARGE:  01/27/2004                                 DISCHARGE SUMMARY   PRIMARY CARE PHYSICIAN:  Sharlet Salina, M.D.   CHIEF COMPLAINT:  Hallucinations and alcohol abuse.   DISCHARGE DIAGNOSES:  1. Alcohol abuse.  2. 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  3. History of chronic abdominal pain.  4. History of undescended testicle.  5. History of hypertension.  6. History of tobacco abuse.  7. General anxiety.   CONSULTATIONS:  Antonietta Breach, M.D., psychiatry.   DISCHARGE MEDICATIONS:  1. Carafate 10 mL p.o. four times a day.  2. Prilosec over-the-counter one p.o. daily.  3. Inderal one to two p.o. daily as needed for tremor.  4. Multivitamin on p.o. daily.  5. Xanax 0.25 mg p.o. q.8h. p.r.n.  6. Thiamine 100 mg p.o. daily.  7. Percocet 5/325 mg p.o. q.6h. p.r.n.   HISTORY OF PRESENT ILLNESS:  This is a 60 year old white male with past  medical history of heavy alcohol abuse, who began to hallucinate after  having a drinking binge times several days.  The patient was found unable to  walk and confused with hallucinations.  He was brought into the emergency  room by his family.  His urine drug screen was positive for benzodiazepines,  which he does have a prescription for, but CT of the head was negative.  It  was felt that the patient likely had some sort of hallucinations possibly  from alcohol withdrawal.  Psychiatry was consulted, and the patient was  started on a Librium protocol as well as a banana bag.   HOSPITAL COURSE:  Problem 1.  With regard to the patient's alcohol abuse  and hallucinations,  following evaluation by psychiatry, they recommended using __________ only  for severe agitation.  The patient began to be much more alert and oriented  by the second day.  He had no further episodes of confusion or agitation,  and he did recall the previous episodes.  He felt it was secondary to  drinking, but however he told me he himself does not have a problem.  I had  an open discussion with him regarding his heavy drinking, especially in  front of his family, and while they did reinforce verbally to the patient  that he did indeed have a problem, the patient himself felt that this was  under control.  I provided him with a number for alcohol counseling, which  he tells me that he will try to go ahead  and take care of it on his own.  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  He said he would take this under  advisement.   Problem 2.  In regard to the patient's tremor, initially the patient was  having pretty severe tremors which were believed to be secondary to his  alcohol withdrawal.  However, following the completion of his Librium  protocol, he continued to have tremors.  On further discussion with the  patient, I found that he had originally been on Inderal for tremors but had  discontinued this medication because he had felt he had not needed it.  According to his family, when he goes on drinking binges he is not compliant  or follows his medications as he is supposed to.  I likely feel after this  evaluation and discussion with the patient feel that he has an essential  tremor that is worsened with anxiety and more controlled with p.r.n.  Inderal.  This remained much more under control after the patient was  started on a dose in the hospital.  He tells me that he will continue his  medication for this as  needed.   Problem 3.  In regard to the patient's anxiety, after being assessed by Dr.  Jeanie Sewer, the patient was started on p.r.n. Xanax.  This greatly improved  his anxiety, and he had no further episodes.   Problem 4.  In regard to patient's chronic right lower quadrant abdominal  pain, the patient says he has had this since his colonoscopy done a few  years back.  He has had a previous history of undescended testicle, which I  could not feel was really related to this; however, following a CT of the  abdomen and pelvis was completely unremarkable.  I advised that the patient  that with his chronic abdominal pain he may want to consider a repeat  colonoscopy as I could not find any further episodes that are anything  related to this abdominal pain.   Problem 5.  In addition, in regard to patient's esophagus, starting about  the second day when the patient was more alert and oriented, he was  complaining of some significant dysphagia.  He told me that he had a history  of bad reflux and I told him that likely he had some esophagitis, locally  made worse with his drinking.  I started him on Carafate, which he continued  to have some problems with dysphagia but it did improve following Carafate.  He had been discharged on one bottle of Carafate to continue for the next  few weeks, to be taken four times a day.  The patient is being discharged to  home.   Problem 6.  In regard to his tobacco abuse, this remained a stable issue.  I  advised him to try to quit.  He told me that he will try to quit on his own.  If he needs help, he will talk to his PCP about it.   The patient is being discharged to home.  His disposition is improved. His  activity is as tolerated.  His diet is a low-sodium diet, and he is advised  not to drink any alcohol or caffeine, which would worsen his tremors.  He is  advised to follow up with his PCP, Sharlet Salina, M.D., in the  next one  to two  weeks.                                               Hollice Espy, M.D.    SKK/MEDQ  D:  03/05/2004  T:  03/06/2004  Job:  [id number]   cc:   Sharlet Salina, M.D.  [address]  [place]  [place] [address]  Fax: [phone]

## 2011-01-09 NOTE — H&P (Signed)
NAME:  Mitchell Mccall, Mitchell Mccall NO.:  [id number]   MEDICAL RECORD NO.:  [id number]          PATIENT TYPE:  EMS   LOCATION:  MAJO                         FACILITY:  [facility]   PHYSICIAN:  Sherin Quarry, MD      DATE OF BIRTH:  1951-08-19   DATE OF ADMISSION:  01/19/2005  DATE OF DISCHARGE:                                HISTORY & PHYSICAL   HISTORY OF PRESENT ILLNESS:  Mitchell Mccall is a 60 year old man with a  longstanding history of alcohol abuse, chronic abdominal pain and peptic  ulcers. Mitchell Mccall was admitted in December of 2005 with a history of  frequent falling. At the time at that admission, the patient categorically  denied alcohol abuse, but family members indicated that he was drinking  heavily. During the course of that hospitalization, the patient was treated  with an Ativan alcohol withdrawal protocol and his symptoms gradually  improved. He acquired a pneumonia during the course this hospitalization  which prolonged his stay. After his discharge, he was admitted to a  treatment center in [place], [place], for alcoholism where he  remained for three weeks. Since that time, the patient categorically denies  consuming any alcohol. His wife indicates that she has not observed him to  drink any alcohol, but she cannot be absolutely sure whether this is the  case or not. Mitchell Mccall has a been unable to work for the last year because  of complaints of back pain, weakness and abdominal pain. In February of this  year, he underwent colonoscopy and endoscopy by Dr. Randa Evens. Dr. Randa Evens  found evidence of gastric ulcers and treated him with Aciphex. He has  stopped taking this medicine and does not remember when he stopped taking  it. At this point, he is taking no medicines regularly except for an  occasional Advil for back pain. About three days prior to admission, the  patient noted the onset of increasing nausea, vomiting and epigastric  burning discomfort  associated with chills and malaise. He has been  repeatedly vomiting since this time. As far as he can tell, his bowel  function is normal. His wife has observed him to be increasingly tremulous  such that at this point he is shaking diffusely. He has been alert, although  she has noted that he seems to have some deficiency of his short-term  memory. He was brought to the emergency room today because of persistent  vomiting. On arrival, his blood pressure was initially 188/96, pulse was  140, respirations 24 and O2 saturation was 98%. He was noted to have a  glucose of 242 and a CO2 of 16. Hemoglobin was 17. MCV was noted to be 105.  He is admitted at this time for treatment of these problems.   CURRENT MEDICATIONS:  None.   ALLERGIES:  None.   OPERATIONS:  The patient states he has had some type of operation on his arm  that was done for a nerve impingement syndrome. He has had a cyst removed  from his back and a foot operation.   MEDICAL  ILLNESSES:  1.  Alcohol abuse.  2.  Chronic abdominal pain see above.  3.  Possible diabetes. The patient's wife states every time he is admitted      to the hospital his blood sugar will be elevated, during the course of      hospitalization he will receive a sliding scale insulin regimen and then      he will be discharged with instructions to follow a diet. When he is      checked as an outpatient, apparently his blood sugars are pretty good.   He is not known to have any history of hypertension or heart disease.   FAMILY HISTORY:  The patient's father died as result of suicide. He had  diabetes. His mother has some type of neuropathy. His brothers and sisters  are all said to be in good health.   SOCIAL HISTORY:  The patient has a long history of alcohol abuse. He is  extremely resistant to discussing this. He smokes one pack of cigarettes per  day. He says he does not abuse any other drugs.   REVIEW OF SYSTEMS:  HEAD:  He denies headache  or dizziness. EYES:  He denies  visual blurring or diplopia.  EARS, NOSE AND THROAT:  Denies earache, sinus  pain or sore throat. CHEST:  He has not been having any coughing or  wheezing. CARDIOVASCULAR:  Denies orthopnea, PND or ankle edema.  GASTROINTESTINAL:  See above. There is been no history of melanoma or  hematochezia. GENITOURINARY:  Denies dysuria or urinary frequency.  NEUROLOGIC:  There is no history of seizure or stroke.  ENDOCRINE:  See  above.   PHYSICAL EXAMINATION:  GENERAL APPEARANCE:  The patient is a very tremulous,  chronically ill-appearing man, who appears older than his stated age. VITAL  SIGNS:  His temperature is 97, blood pressure is 179/83, pulse is 120,  respirations are 18 and O2 saturations 94%. HEENT:  Exam is within normal  limits.  CHEST:  Clear.  BACK:  Examination revealed mild tenderness to fist percussion of the lumbar  area.  CARDIOVASCULAR:  Exam reveals a tachycardia. There is normal S1-S2 without  rubs or gallops.  ABDOMEN:  Reveals good bowel sounds. The abdomen is mildly tender in the  epigastric area.  NEUROLOGIC:  Testing is remarkable for a profound diffuse essential tremor.  EXTREMITIES:  Examination reveals no evidence of cyanosis or edema.   IMPRESSION:  1.  Acute onset of a syndrome of nausea, vomiting, epigastric pain, diffuse      shaking and tachycardia. This is very closely mimics the patient's      presentation in December of last year at which time his presentation was      secondary to alcohol withdrawal. This time both the patient and his wife      indicate that he is not withdrawing from alcohol and therefore it is      uncertain whether this is a factor or not.  2.  Long history of alcohol abuse.  3.  Chronic back pain with secondary disability.  4.  Abnormal glucose.  5.  Increased blood pressure.   PLAN: 1.  Will initiate vigorous IV hydration and follow electrolytes closely.  2.  A sliding scale insulin regimen will  be begun.  3.  Will check alcohol level, urine toxicology screens, CMET, amylase,      lipase and urinalysis.  4.  Will obtain abdominal ultrasound.  5.  Okay the patient NPO except  for sips of clear liquids.  6.  The patient has had a previous colonoscopy and an endoscopy earlier this      year by Dr. Randa Evens and a repeat GI consult might be considered.  7.  Although this gentleman absolutely denies drinking any alcohol, his      presentation is so classic for alcohol withdrawal that I think we have      to consider this that is a possibility.  I am going to give him thiamine      empirically and place him on Ativan alcohol withdrawal protocol until      this is clarified.      SY/MEDQ  D:  01/19/2005  T:  01/19/2005  Job:  [id number]

## 2011-01-09 NOTE — Discharge Summary (Signed)
NAME:  Mitchell Mccall, Mitchell Mccall NO.:  [id number]   MEDICAL RECORD NO.:  [id number]          PATIENT TYPE:  INP   LOCATION:  4714                         FACILITY:  [facility]   PHYSICIAN:  Theone Stanley, MD   DATE OF BIRTH:  10-Jun-1951   DATE OF ADMISSION:  03/04/2005  DATE OF DISCHARGE:  03/15/2005                                 DISCHARGE SUMMARY   ADMITTING DIAGNOSES:  1.  Seizures.  2.  History of alcohol abuse.  3.  Subdural hematoma.  4.  Essential tremor.  5.  Hypertension.  6.  Chronic low back pain.  7.  Tobacco abuse.   DISCHARGE DIAGNOSES:  1.  Subacute subdural hematoma, stable.  2.  Seizures secondary to #1 and most likely alcohol.  3.  Alcoholism.  4.  Essential tremor.  5.  Hypertension.  6.  Chronic low back pain.  7.  History of gastric ulcers.  8.  Tobacco abuse.   PROCEDURES DIAGNOSTIC TESTS:  Patient had two CT scans while here in the  hospital.  First CT scan was performed on March 04, 2005, which showed  subacute right frontoparietal subdural hematoma with minimal local mass  effect and midline shift.  The subdural hematoma was measured at 7 mm.  There was approximate 3 mm right-to-left subfalcine shift.  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   CONSULTATIONS:  1.  Dr. Jeanie Sewer, with Psychiatry.  2.  Dr. Wanda Plump, with Urology.  3.  Dr. Lovell Sheehan.   HOSPITAL COURSE:  Mitchell Mccall is a 60 year old gentleman with a history of  alcohol abuse, chronic back pain, gastric ulcers presenting with seizures.  It was noted by his sister that he had seizure activity and EMS was called.  It was found that he had a subacute subdural hematoma.  He was admitted at  that time.  1.  Subdural hematoma.  The exact timing of trauma or fall is not evident;      however, this has remained stable.  Dr. Lovell Sheehan was kind enough to     consult on this patient, he continued to follow him.  Once he was      stable, it was felt that he could go home.  2.  Seizures.  This is most likely due to the subdural hematoma, in      addition, alcohol abuse.  Patient vehemently denies drinking; however,      he has a long history of alcohol abuse with extreme denial.  He was      placed on Ativan protocol while here in the hospital.  He had no      evidence of withdrawal.  3.  Delirium/encephalopathy.  The patient had delirium and evidence of      encephalopathy while here in the hospital.  Psychiatry was asked to come      by to see if there was any evidence of Wernicke's and see his capacity.      They  felt that he had capacity, other diagnosing anxiety disorder not      otherwise specified, delirium not otherwise specified which has      resolved; however, they did not give him the diagnosis of Wernicke's.      However, they did note that patient may have had delirium and problems      associated with alcoholism which resolved with nutritional support.      Recommendations were to start Lexapro.  Patient was started on Lexapro      here in the hospital and he will be discharged on that.  4.  Essential tremor.  Patient was started on propranolol to address this      and his hypertension.  His tremor resolved.  5.  Hypertension.  Again, the patient was started on propranolol and this      controlled his hypertension quite well here in the hospital.  6.  Traumatic removal of Foley.  This happened while the patient was here in      the hospital.  Dr. Wanda Plump was pleasant enough to come by and reinsert      a larger [place] catheter.  This remained for several days and this was      removed.  The patient was able to urinate without any difficulties.  He      had intermittent hematuria; however, each time this had resolved.   The patient left the hospital in stable condition.   DISCHARGE MEDICATIONS:  1.  Multivitamins one p.o.  daily.  2.  Folic acid 1 mg one p.o. daily.  3.  Thiamine 100 mg one p.o. daily.  4.  Inderal 20 mg one p.o. b.i.d.  5.  Neurontin at home dosage.  6.  Dilantin 300 mg one p.o. q.h.s.  7.  Lexapro 5 mg one p.o. daily x7 days then increase to 10 mg one p.o.      daily times indefinite or until discussed with psychiatry or primary      care.   DISCHARGE INSTRUCTIONS:  Patient needs to get a CAT scan in 1 month's time,  this has been set up for April 13, 2005, at 8 a.m. at [facility].  He is also to followup with Dr. Lovell Sheehan about that time for followup in  regards to the subdural hematoma.  Patient is to followup with Dr. Wanda Plump  in 2-3 weeks and Dr. Marny Lowenstein in 1-2  weeks, and at Dr. Jacqualine Code you need to obtain a Dilantin level.  He was  instructed that if he has increasing hematuria or difficulty urinating to  either contact Dr. Wanda Plump or go to the emergency room or Dr. Marny Lowenstein.  Patient understood this and he was discharged in stable condition.       AEJ/MEDQ  D:  03/15/2005  T:  03/15/2005  Job:  [id number]   cc:   [employer], M.D.  [address]  [place]  [place] [address]  Fax: [phone]   Sharlet Salina, M.D.  [address]  [place]  [place] [address]  Fax: [phone]   Antonietta Breach, M.D.   Cristi Loron, M.D.  [address]  [place]  [place] [address]  Fax: [phone]

## 2011-01-09 NOTE — H&P (Signed)
NAME:  Mitchell Mccall, Mitchell Mccall                            ACCOUNT NO.:  [id number]   MEDICAL RECORD NO.:  [id number]                   PATIENT TYPE:  EMS   LOCATION:  MAJO                                 FACILITY:  [facility]   PHYSICIAN:  Melissa L. Ladona Ridgel, MD               DATE OF BIRTH:  04/09/1951   DATE OF ADMISSION:  01/23/2004  DATE OF DISCHARGE:                                HISTORY & PHYSICAL   PRIMARY CARE PHYSICIAN:  Sharlet Salina, M.D.   CHIEF COMPLAINT:  Hallucinations and confusion times 48 hours.   HISTORY OF PRESENT ILLNESS:  The patient is a 60 year old white male with a  past medical history significant for significant daily alcohol use and  chronic pain since his colonoscopy a number of months back. His wife relates  that on Sunday he slept most of the day and since Monday he has been noted  to have staggering gait, hallucinating about bugs and pretty much making no  sense.  This was intermittent and today he was unable to ambulate well.  The  patient was taken to his primary care physician's office for evaluation and  determined that he needed to come to the emergency room.  He has recently  been continuing to workup abdominal pain and had a CT last week that showed  some sort of a soft tissue mass in his abdomen.  The CT is unavailable at  this time, but we will attempt to obtain that.  The patient himself is  intermittently able to give appropriate history; however, he does interject  information that is not reliable according to his wife.   REVIEW OF SYMPTOMS:  Significant for the above as well as a tremor and  worsening complaint of abdominal pain.  GU:  He denies any dysuria.  GI:  He  denies any melena or hematochezia.  CARDIOPULMONARY:  He denies any chest  pain or shortness of breath.   PAST MEDICAL HISTORY:  1. Colonoscopy in July of 2004 status post a rectal bleed and he had some     polyps removed.  2. Hypertension for which he is not taking any medication.  3. Pneumonia documented back in December of 2003.  4. Recently diagnosed with an undescended testicle.  5. Chronic abdominal pain that is currently being worked up.   SOCIAL HISTORY:  He abuses alcohol daily.  He smokes a pack a day.  He  denies any other drug use.   ALLERGIES:  He has no known drug allergies.   MEDICATIONS:  1. Remeron 30 mg q.i.d. p.r.n.  2. Xanax 0.25 mg p.r.n.  His wife states that he has not been either of     those in quite some time.  3. Elavil 25 mg p.o. q.h.s. for pain.  4. Flexeril 10 mg p.o. b.i.d. also for pain.   PHYSICAL EXAMINATION:  VITAL SIGNS ON ADMISSION:  Temperature 97.0,  blood  pressure 149/78, pulse 112 to 117, respiratory rate 20, saturation 98%.  GENERAL:  He is a mildly agitated, intermittently confused white male who is  well developed, well nourished.  HEENT:  He is normocephalic, atraumatic.  Pupils equal, round and reactive  to light.  The sclerae are anicteric.  Extraocular movements are intact.  Mucous membranes are dry.  He has a coated tongue with halitosis.  His TM's  are clear.  CHEST:  Clear to auscultation without wheezes, rales or rhonchi.  There are  no telangiectasias.  CARDIOVASCULAR:  Tachycardic, positive S1 and S2 with no S3, S4, murmurs,  rubs or gallops.  ABDOMEN:  Mildly tender specifically in the left lower and upper quadrants.  He does have hepatomegaly.  EXTREMITIES:  2+ pulses.  No edema and no clubbing.  NEUROLOGIC:  Intermittently confused.  He can tell me the year, thinks it is  April and he can tell me the place, especially the name of this hospital.  He is able to identify this.  However, during the course of the interview he  did confabulate and tell stories that were not related to the conversation.  He does have a resting tremor at this time with some extension of the  fingers of both hands.   LABORATORY DATA:  Urine drug screen is positive for benzodiazepines.  UA is  negative.  Sodium 135, potassium  4.9, chloride 105, C02 26, BUN 5,  creatinine 0.7 and glucose is 94.  White count is 6.3, hemoglobin 13.7,  hematocrit 40.2 and platelet count of 228,000.  Pending laboratories include  a chest x-ray and MRI and MRA.  CT of his  head is negative.  Ammonia level  as well as an ABG and EKG are also pending.   ASSESSMENT AND PLAN:  1. This is a 60 year old white male with a significant ethanol history and     chronic abdominal pain who presents with 24 to 48 hours of worsening     confusion and hallucinations.  There is no obvious event on the CT.  We     will rule out a stroke with MRI and MRA, but the suspicion is acute     encephalopathy versus delirium tremens.  We will complete a metabolic     workup as ordered including an RPR, TSH, vitamin B12, CMET, ammonia level     and arterial blood gas.  Due to the evidence of positive benzodiazepines,     it is unclear whether this is from what he received here in the emergency     room or at home.  2. Cardiovascular.  He is tachycardic.  We will check an electrocardiogram.     This is likely secondary to withdrawal, however, if it is an aberrant     rhythm, we will deal with this.  His blood pressure is mildly elevated     and we will treat this p.r.n.  3. Pulmonary.  He has no current clinical issues, but we will check an     arterial blood gas and a chest x-ray to rule out occult infection.  4. Gastrointestinal.  Protonix 40 mg will be provided and we will follow-up     on the CT that was done as an outpatient.  We will check liver function     tests.  5. Genito-urinary.  We will check a urinalysis, culture and sensitivity for     occult infection.  6. Endocrine.  We will check a TSH  and give him multivitamins, folate and     Thiamine therapy.  7. Psychiatric.  A consult has been called for assistance with his     hallucinations.  He will receive Haldol p.r.n. and Librium detox per     protocol.                                                Melissa L. Ladona Ridgel, MD    MLT/MEDQ  D:  01/23/2004  T:  01/23/2004  Job:  [id number]   cc:   Maryla Morrow. Modesto Charon, M.D.  [address]  [place]  [place] [address]  Fax: [phone]   Sharlet Salina, M.D.  [address]  [place]  [place] [address]  Fax: [phone]

## 2011-01-09 NOTE — Discharge Summary (Signed)
NAME:  Mitchell Mccall, Mitchell Mccall NO.:  [id number]   MEDICAL RECORD NO.:  [id number]          PATIENT TYPE:  INP   LOCATION:  3022                         FACILITY:  [facility]   PHYSICIAN:  Marlan Palau, M.D.  DATE OF BIRTH:  09-10-1950   DATE OF ADMISSION:  01/07/2006  DATE OF DISCHARGE:  01/11/2006                                 DISCHARGE SUMMARY   ADMISSION DIAGNOSES:  1.  History of seizure disorder with recent recurrence.  2.  Alcohol abuse.  3.  Dilantin toxicity.   DISCHARGE DIAGNOSES:  1.  History of seizures with recent recurrence.  2.  Alcohol abuse.  3.  Dilantin toxicity.  4.  Hypertension.  5.  History of subdural hematoma.   PROCEDURES DURING THIS ADMISSION INCLUDED:  MRI scan of the brain.   COMPLICATION OF ABOVE PROCEDURE:  None.   HISTORY OF PRESENT ILLNESS:  Mitchell Mccall is a 60 year old white male, born  26-Jun-1951, with a history of alcohol abuse and a history of  seizures.  This patient claims he has had events as a teenager that he calls  seizures, but the episodes are described as events where he sees colors, has  sensations of odors, does not lose consciousness with events, may have  headaches after these events.  Patient, however, began having generalized  tonic clonic seizure events in July of 2006.  The patient had a subdural  hematoma at that time, was seen by Neurosurgery but was treated  conservatively.  Patient has been on Dilantin since that time, has done  fairly well but within the last 2 weeks prior to admission stopped the  medication and entered into alcohol rehab.  Patient was at [facility]  and had several seizures upon entrance into the rehab facility associated  with alcohol withdrawal.  The patient was apparently placed back on Dilantin  but his dose was doubled from 300 to 600 mg a day.  Patient within several  days became drunk, staggery, decreased memory and the day of admission had  two more generalized  seizures.  The patient did not bite his tongue or lose  control of the bowels or the bladder.  Patient noted some warning prior to  the seizure, once again had some auditory component of music prior to the  seizure event and similar to what he has had before with his auditory and  olfactory events.  Patient was noted to have a Dilantin level of 29.3 in the  emergency room, Neurology was called for further evaluation.   PAST MEDICAL HISTORY:  Significant for:  1.  History of seizures with multiple episodes.  2.  Alcohol abuse.  3.  Dilantin toxicity.  4.  Subdural hematoma in the past.  5.  Hypertension.  6.  Low back pain.  7.  Peptic ulcer disease.  8.  History of surgery on the right arm to release a nerve in the past.   Patient is on Dilantin 300 mg a day prior to coming in.  Had in the past  been on some Neurontin, Cymbalta,  lithium, thiamine and folate.  Patient,  however, remains only on some thiamine and folate, is off the other  medications.  THERE IS NO KNOWN ALLERGIES.  Smokes a pack of cigarettes a  day.  Had been drinking a pint or pint and a half of alcohol daily.  Patient  did not go through the DTs in [facility].  Please refer to History and  Physical dictation summary for social history, family history, review of  systems, physical examination.   Laboratory values are notable for a white count of 8.8 on admission,  hemoglobin 13.8, hematocrit 40.7, MCV of 109.3, sodium 139, potassium 3.6,  chloride of 103, CO2 of 25, glucose of 124, BUN of 7, creatinine 0.6.  Alkaline phosphatase 41, SGOT of 19, SGPT of 9, total protein 6, albumin  3.1, calcium of 8.7, Dilantin level of 25 the day after admission.  Vitamin  B12 level was 586 and folate level of 6.6.   HOSPITAL COURSE:  This patient has done well during the course of  hospitalization.  The patient has not had any further seizure events since  admission.  The patient was taken off Dilantin until the levels came  down to  18 and restarted on 300 mg a day.  The patient was put back on Neurontin,  taken 300 mg three times a day and has done well with this.  The patient has  complained of some pain into the arms and hands, has had chronic numbness in  the hands associated with some neck discomfort that has been present for a  number of years.  The patient has been ambulatory during this  hospitalization, doing quite well.  Patient has tolerated the current dose  of Dilantin well and plans are to send this patient back to [facility].  An EG study was ordered but it has not yet been done.  MRI scan of the brain  was done during this admission.  This shows atrophy with chronic small  vessel ischemic changes seen, no acute changes were noted and no evidence of  focal temporal lobe abnormalities were seen.  The patient is to be  discharged to [facility] on the 21st of May, 2007, taking Dilantin 300  mg q.h.s., Neurontin 300 mg three times a day, is on thiamine 100 mg a day  and folate 2 mg a day.  Patient will followup with [facility] within 2-3 months following discharge.  If the EG study has not  been done, will repeat it at that point.  Patient may need further  evaluation in terms of his arm and hand pain and numbness.      Marlan Palau, M.D.  Electronically Signed     CKW/MEDQ  D:  01/11/2006  T:  01/11/2006  Job:  [id number]   cc:   Sharlet Salina, M.D.  Fax: [phone]   [facility]  [address]  [place], [place] [address]

## 2011-01-09 NOTE — Discharge Summary (Signed)
NAME:  Mitchell Mccall, Mitchell Mccall                  ACCOUNT NO.:  [id number]   MEDICAL RECORD NO.:  [id number]          PATIENT TYPE:  INP   LOCATION:  5740                         FACILITY:  [facility]   PHYSICIAN:  Hollice Espy, M.D.DATE OF BIRTH:  02-Jan-1951   DATE OF ADMISSION:  01/19/2005  DATE OF DISCHARGE:  01/25/2005                                 DISCHARGE SUMMARY   PRIMARY CARE PHYSICIAN:  Sharlet Salina, M.D.   CHIEF COMPLAINT:  Nausea and vomiting.   DISCHARGE DIAGNOSES:  1.  Epigastric pain with acute onset nausea and vomiting.  2.  Suspected alcohol withdrawal.  3.  Alcohol abuse.  4.  Chronic back pain.  5.  History of elevated blood sugars.  6.  History of hypertension.   DISCHARGE MEDICATIONS:  1.  Thiamine 100 mg one p.o. daily.  2.  Folic acid 1 mg one p.o. daily.  3.  Protonix 40 mg p.o. daily.  4.  Ativan 1 mg p.o. 10 p.m. tonight and 1 mg p.o. 10 a.m. tomorrow morning.   DISCHARGE INSTRUCTIONS:  1.  Diet:  Regular.  2.  Activity:  As tolerated.   HOSPITAL COURSE:  The patient is a 60 year old white male with past medical  history of alcohol abuse who has had several admissions to the hospital for  alcohol withdrawal, as well as, gastritis.  He states that at this time he  has not had anything to drink since his discharge from the alcohol treatment  facility in [facility] approximately a few months prior.  However,  according to the patient's wife, who no longer lives with him, and his  sister, he has been drinking since his discharge from that facility, though,  the patient vehemently denies this.  He presents with acute onset of  abdominal pain with nausea and vomiting, which on review of previous  records, was the similar presentation of patient's previous hospitalization.  On admission, his white count was found to be normal.  He had a normal BUN  and creatinine indicating no evidence of any severe dehydration.  He did  have an elevated AST and ALT of 104 and  90.  The patient was admitted,  started on IV fluids, given medication for nausea, and given his previous  presentation, this would be a presumed alcohol withdrawal, and he was  started on Ativan taper.  He tolerated this taper well, and was able to be  changed over to p.o. by Sunday, January 25, 2005.  At this point, the patient  was felt to be able to complete his taper at home.  In discussion with his  sister and his wife, the patient does continue to take alcohol and there was  some concern that he continue to do this upon discharge home.  I discussed  the family option that in the end the decision is up to the patient.  However, on advising this patient with his continued drinking, he is being  detrimental to his health, although, he himself is still in denial and tells  me that he has not had anything to  drink.  I think that should these  episodes occur, perhaps a sit down confrontation with intervention with the  family may benefit the patient.  He does admit to his previous history of  drinking, but says he has not since then.   PLAN:  Discharge the patient to home.   DISPOSITION:  Overall disposition is improved.      SKK/MEDQ  D:  01/25/2005  T:  01/26/2005  Job:  [id number]   cc:   Sharlet Salina, M.D.  [address]  [place]  [place] [address]  Fax: [phone]

## 2011-01-09 NOTE — H&P (Signed)
NAME:  Mitchell Mccall, Mitchell Mccall NO.:  [id number]   MEDICAL RECORD NO.:  [id number]          PATIENT TYPE:  INP   LOCATION:  1824                         FACILITY:  [facility]   PHYSICIAN:  Theone Stanley, MD   DATE OF BIRTH:  1951-01-16   DATE OF ADMISSION:  03/04/2005  DATE OF DISCHARGE:                                HISTORY & PHYSICAL   CHIEF COMPLAINT:  Seizures.   HISTORY OF PRESENT ILLNESS:  Mitchell Mccall is a 60 year old gentleman with a  history of alcohol abuse, chronic low back pain, gastric ulcers, presenting  with seizures.  Apparently his sister was witness to this, however, she is  not present.  Accounts are secondhand from patient and wife and EMS report.  The patient states that he remembers seeing six people while he was sitting  in the living room.  According to the wife, the sister had called EMS and  also the wife and stated that the patient was frothing at the mouth and  shaking all over.  According to the EMS report, the patient apparently had a  grand mal seizure of approximately 2-minute duration, was confused  afterward.  EKG showed sinus tachycardia, SPO2 at 98%.  Currently the  patient is able to answer questions.  He does not recall much of the event  and based on old records there appears to be memory issues secondary to  chronic alcohol abuse.  Currently the patient denies any alcohol  consumption, however, the wife apparently thinks that he is currently  drinking and when he does drink, he drinks about a half of bottle of vodka.  His sister is also an alcoholic, so it is difficult to determine how much  the patient drinks.  Again, based on old records, the patient has a history  of vehemently denying having alcohol issues, however, he has been treated in  the past for this.  The patient stated that he has not been feeling well  over the past week or so along with his sister, it seems to be a viral type  illness.  He has some intermittent nausea and  vomiting some time last week,  overall feeling under the weather and having malaise.  His sister also  feeling sick with a cold.  The patient denies any trauma to the head.  He  does not remember falling at this point in time.   PAST MEDICAL HISTORY:  Chronic back pain, upper arm numbness, gastric  ulcers.  The patient had an EGD performed by Fayrene Fearing L. Randa Evens, M.D. in  February of 2006 which showed gastric ulcers.  History of hypertension.   MEDICATIONS:  1.  Neurontin one pill a day, he is unsure of the dosage.  I will start out      with 300 mg daily and further dosages will need to be found out.  2.  AcipHex one p.o. daily.   ALLERGIES:  No known drug allergies.   PAST SURGICAL HISTORY:  The patient had some type of operation on his right  arm for nerve impingement.   FAMILY  HISTORY:  Father passed away from a suicide.  Mother had some type of  neuropathy.  Brother and sisters are in good health.   SOCIAL HISTORY:  The patient is married, but separated.  His sister lives  with him.  He vehemently denies having alcohol consumption, but again that  he is drinking some at this point in time.  He smokes 1/2 pack per day.  No  illicit drug use.   REVIEW OF SYSTEMS:  Please see HPI, otherwise, all systems were negative.   PHYSICAL EXAMINATION:  VITAL SIGNS:  Blood pressure 174/93, temperature  98.5, pulse 116, respiratory rate 20, saturating 99% on room air.  HEENT:  Head normocephalic and atraumatic.  Eyes 3 mm, pupils reactive to  light.  Extraocular movements intact.  Ears with no discharge.  Throat clear  with no erythema and no exudate.  Mucosa moist.  NECK:  Supple, no lymphadenopathy, and no JVD.  HEART:  Regular rate and rhythm, no murmurs, rubs, or gallops appreciated.  LUNGS:  Clear to auscultation bilaterally.  ABDOMEN:  Soft and nontender.  Nondistended.  The patient's liver appeared  to be enlarged about four fingerbreadths below the costal margin.  EXTREMITIES:  No  cyanosis, clubbing, or edema.  NEUROLOGY:  The patient was alert and oriented x3.  Nonfocal.  GENITOURINARY:  Deferred.   LABORATORY DATA:  Currently the only lab obtained by the ER physician is  alcohol level which was less than 5.  His CT scan of the head showed a  subacute 7 mm subdural frontal hematoma with a 2 to 3 mm midline shift.  Otherwise no other acute issues.   ASSESSMENT:  1.  Subdural hematoma.  Neurosurgery will be contacted.  In the meantime, we      will prophylactically place him on Dilantin.  Put him on seizure      precautions, fall precautions.  2.  Alcohol abuse.  Although the patient vehemently denies drinking alcohol,      he has a history of this and it appears that he is drinking and we will      place him on Ativan alcohol withdrawal protocol, multivitamin, folic      acid, thiamine, banana bag.  Check a CBC, CMET, amylase, lipase, urine      drug screen, PT/INR, PTT, and urinalysis.  3.  Chronic back pain.  The patient does not appear to be on any pain      medications.  Will continue with his Neurontin.  4.  Hypertension.  The patient has evidence of hypertension here in the ER.      He was on Inderal for essential tremor.  Will place him back on this      medication so that we can control his blood pressure and his essential      tremor.       AEJ/MEDQ  D:  03/05/2005  T:  03/05/2005  Job:  [id number]   cc:   Sharlet Salina, M.D.  [address]  [place]  [place] [address]  Fax: [phone]

## 2011-01-09 NOTE — H&P (Signed)
NAME:  AMBER, GUTHRIDGE NO.:  [id number]   MEDICAL RECORD NO.:  [id number]          PATIENT TYPE:  EMS   LOCATION:  MAJO                         FACILITY:  [facility]   PHYSICIAN:  Hollice Espy, M.D.DATE OF BIRTH:  1951/06/16   DATE OF ADMISSION:  08/21/2004  DATE OF DISCHARGE:                                HISTORY & PHYSICAL   CHIEF COMPLAINT:  Falling.   HISTORY OF PRESENT ILLNESS:  The patient is a 60 year old white male with a  past medical history of alcohol abuse, chronic abdominal pain and ulcers,  who was brought to the emergency room after he fell.  The patient is a vague  historian.  He is not accompanied by his wife.  He is instead accompanied by  his sister, but according the patient he has fallen twice this week, once  when he tripped on some water from a leaking washing machine and next when  he tripped over the dishwasher door.  According to his wife as passed on by  his sister, the patient has fallen four times in the past week.  When asked  about his alcohol abuse, the patient says he has cut down immensely and only  drinks a couple of shots once or twice a week.  The patient was noted to be  tachycardiac since his arrival, with a heart rate in the 150s.  He tells me  his last drink was last night.  In discussion with his sister, he has been  drinking heavily, multiple drinks for the past week nonstop.  Lab work which  was not initially available but which came back after evaluation was noted  to have an alcohol level elevated at 0.298.  The patient complains of some  right-sided chest pain.  By chest x-ray he is noted to have a nondisplaced  8th rib fracture anteriorly.  His chest x-ray is otherwise unremarkable.  Currently, the patient states he is doing okay.  He complains of some right  lower quadrant pain which is chronic as has been described in my previous  discharge summary from June 2005.  The patient otherwise denies any  headaches,  visual changes, dysphagia, left-sided chest pain, palpitations,  shortness of breath, wheeze, cough.  He has some chronic right lower  quadrant abdominal pain but otherwise no hematuria, dysuria, constipation or  diarrhea.   PAST MEDICAL HISTORY:  Alcohol abuse.  He has a benign essential tremor  believed to be secondary to his alcoholism.  Chronic abdominal pain.  Chronic back pain, as well as diabetes mellitus which the patient says he is  no longer on medication for.   MEDICATIONS:  The patient is on a number of medications; however, his states  that he only has been taking the Protonix as well as the chronic narcotic  medications for his back on a regular to heavy basis and has not been taking  much of anything else.   ALLERGIES:  The patient has no known drug allergies.   SOCIAL HISTORY:  He is a heavy alcohol abuser, though he will not comment as  to exactly how much he is drinking.  He denies any current tobacco or drug  use.   FAMILY HISTORY:  Noncontributory.   PHYSICAL EXAMINATION ON ADMISSION:  VITAL SIGNS:  Heart rate 150, which has  only come down to about 140; temperature 98.4; CO2 100% on 2 liters, 96% on  room air; respirations 28; blood pressure 134/94.  GENERAL:  The patient is alert and oriented x3.  He appears not to be  grossly intoxicated despite his high alcohol level.  HEENT:  Normocephalic, atraumatic.  His mucous membranes are moist.  NECK:  He has no carotid bruits.  HEART:  Regular rhythm but tachycardiac.  LUNGS:  Clear to auscultation bilaterally.  ABDOMEN:  Soft and nontender, slightly distended, positive bowel sounds.  EXTREMITIES:  No clubbing, cyanosis or edema.  He has a baseline tremor.   LABORATORY DATA:  White count 6.2 with only 39% neutrophils, H&H 15.7 and  44, MCV 105, platelet count 244,000.  Sodium 137, potassium 2.1, chloride  101, bicarbonate 24, BUN 4.0, creatinine 0.8, glucose elevated at 262.  Calcium 8.2.  Total protein 7.1,  albumin 3.3.  AST 74, ALT 64, alkaline  phosphatase 92, total bilirubin 0.4.  Alcohol level was elevated at 98.  PT  12.5, INR 0.9, PTT 30.  Urinalysis and urine drug screen are pending.   ASSESSMENT/PLAN:  1.  Recurrent syncope, history by wife four times this week, although the      patient minimizes this, wife says he is covering, likely from alcohol      abuse.  He also has a history of narcotic abuse.  We will go ahead and      get a physical therapy consult.  2.  Alcohol abuse.  High risk of withdrawal and given his tachycardia, will      go ahead and start Ativan.  His last drink was last night.  His      tachycardia is likely secondary to alcohol withdrawal.  He does not      appear to be dehydrated given his normal BUN and creatinine.  He has a      history of mild renal insufficiency.  This also may be partially due to      his anterior 8th rib fracture.  Hemoglobin and hematocrit are stable.      We will go ahead and check an arterial blood gas to rule out hypoxia.  3.  Chronic abdominal pain of questionable etiology.  He has continued to      complain of this for a long time, says it is hurting him.  We will get a      colonoscopy done.  He also has a history of left undescended testicle.      He has never followed this up and he continues to complain about this.      Would not go extensively into working this up as an inpatient.  In      addition, he is noted to have a history of gastric ulcers and has had      multiple esophagogastroduodenoscopies for this.  4.  Anxiety.  5.  Rib fracture.  Will give narcotics very sparingly.      Send   SKK/MEDQ  D:  08/21/2004  T:  08/21/2004  Job:  [id number]

## 2011-01-09 NOTE — Discharge Summary (Signed)
NAME:  Mitchell Mccall, Mitchell Mccall NO.:  [id number]   MEDICAL RECORD NO.:  [id number]          PATIENT TYPE:  INP   LOCATION:  4710                         FACILITY:  [facility]   PHYSICIAN:  Jackie Plum, M.D.DATE OF BIRTH:  1950-10-23   DATE OF ADMISSION:  08/21/2004  DATE OF DISCHARGE:  08/29/2004                                 DISCHARGE SUMMARY   DISCHARGE DIAGNOSES:  1.  Alcohol withdrawal.  2. Alcohol abuse, single episode.  3. Rib fracture.      4. Hospital-acquired pneumonia.   DISCHARGE MEDICATIONS:  1.  Duragesic patch 50 mg patch q.72h.  2.  Folic acid 1 tablet daily.  3.  Humibid L.A. 600 mg b.i.d.  4.  Nicoderm patch 21 mg daily.  5.  Protonix 40 mg daily.  6.  Augmentin 875 mg b.i.d.  7.  Propranolol 60 mg b.i.d.  8.  Thiamine 100 mg daily.   DISCHARGE LABORATORIES:  WBC 7.1, hemoglobin 12.0, hematocrit 34.3, MCV  106.2, platelet count 230.  Sodium 134, potassium 3.9, chloride 102, CO2 27,  glucose 103, BUN 6, creatinine 0.8, calcium 8.1.  TSH 6.731, free T4 11.06,  free T3 2.6.   CONSULTATIONS:  Not applicable.   DISCHARGE CONDITION:  Improved.   HISTORY OF PRESENT ILLNESS:  The patient was brought to the emergency room  by his family on account of multiple falls which were apparently associated  with alcohol intoxication.  According to the history and physical, the  patient had tried to minimize this and one hour later this morning,  indicated to me that he did not really pass out and that the fall was due to  tripping on a wet floor rather than due to his alcohol.  He sustained a  right rib fracture from the fall which was nondisplaced by x-ray.  He was  noted to be tachycardic in the 150s but had no complaint of any chest pain  or shortness of breath.  On admission, he did not have any leukocytosis.  His white count was 6.2 with a hemoglobin of 15.7.  His potassium was low at  2.1 with a BUN of 4, creatinine of 0.8, and leukocytes 2.  His AST  was  elevated at 24 with ALT of 64.  He was admitted for questionable syncope and  alcohol abuse with early withdrawal.   HOSPITAL COURSE:  The patient was admitted to the hospitalist service and  started on alcohol withdrawal protocol with Ativan, thiamine, and folates.  He was on started telemetry monitoring and there were no significant  consistent arrhythmias.  His electrolytes were appropriately corrected.  IV  fluid supplementation was added to patient's medication regimen.  He  received counseling sessions with me and agreed to follow up at inpatient  alcohol rehab fellowship hall.  The patient started having some fever and x-  ray showed pneumonia which had been acquired more than 72 hours after  admission.  He received appropriate initiation of antibiotics with  intravenous Zosyn which was subsequently changed to p.o. Augmentin for  treatment of his pneumonia process.  He will need followup x-ray in about 4  weeks.   The patient was noted to be very tremulous and admitted that he has a  history of essential tremor, which is being controlled with Inderal.  His  Inderal was gradually increased until his tremulousness was well controlled  and he is being discharged home on increased dose of 60 mg twice daily of  Inderal.   PHYSICAL EXAMINATION:  GENERAL:  Today, the patient does not have any chest  pain, no shortness of breath, no fever or chills.  He is fine.  He had good  appetite, having no nausea or vomiting.  VITAL SIGNS:  Temperature is 97.7 degrees Fahrenheit, pulse 77, respirations  20.  CBG 110.  BP 130/68, oxygen saturation 97% on room air.  LUNGS: Clear to auscultation except for mild occasional rhonchi on the right  upper lung field.  He had good breath sounds.  CARDIAC: Regular rate and rhythm, no murmur, gallop, or rub.  ABDOMEN: Soft, nontender, no edema.  NEUROLOGIC: Alert and oriented x2, in no acute distress.   DISPOSITION:  He is being discharged from the  hospital to rehab hall for  alcohol rehab.  Discussed with the patient the workup that was done in the  hospital and planned outpatient care.  I told him that he needs to follow up  with his primary care physician, Dr. Lenise Arena, in about 3-4 weeks for repeat x-  ray to make sure that his infiltrate has cleared.  I spent more than 50  minutes with this patient for discharge.  I re-emphasized the need to be  committed to this program.  He has been in several other programs in the  outpatient level which have not worked for him.  He has a very supportive  daughter and wife who are there to help him go through this.      Geor   GO/MEDQ  D:  08/29/2004  T:  08/29/2004  Job:  [id number]

## 2011-01-09 NOTE — H&P (Signed)
NAME:  BELFORD, PASCUCCI NO.:  [id number]   MEDICAL RECORD NO.:  [id number]          PATIENT TYPE:  EMS   LOCATION:  ED                           [facility]ACILITY:  [facility]   PHYSICIAN:  Marlan Palau, M.D.  DATE OF BIRTH:  Jan 11, 1951   DATE OF ADMISSION:  01/07/2006  DATE OF DISCHARGE:                                HISTORY & PHYSICAL   HISTORY OF PRESENT ILLNESS:  Mitchell Mccall is a 60 year old white male born  on April 30, 1951 with a history of alcohol abuse and a history of  seizures.  This patient claims to have had a history of seizures since he  was a teenager but describes these events as being spells where he sees  colors, has a sensation of odors but does not lose consciousness with these  events; however, the patient had a more generalized tonic/clonic seizure  event that occurred back in July, 2006.  This event was associated with a  subdural hematoma, and neurosurgery saw and evaluated this patient but opted  to watch the patient conservatively.  The patient has done quite well with  the seizures since that time but apparently stopped the medications two  weeks ago but entered into alcohol rehab over the last weekend, five days or  so prior to this evaluation.  The patient had a multitude of seizures at  that time, necessitating treatment but the patient was not placed back on  300 mg a day but 600 mg a day of Dilantin.  The patient subsequently became  drunk, staggering, decreased memory today.  The patient had two generalized  seizures.  Once again, the patient did not bite his tongue or loose control  of his bowels or bladder.  The patient believes there was some warning prior  to this seizure, had once again some auditory component prior to the  seizure.  Dilantin level was 29.3.  Neurology was called for further  evaluation.   PAST MEDICAL HISTORY:  1.  History of seizures with multiple episodes recently, two on the day of      admission.  2.   Alcohol abuse.  3.  Dilantin toxicity.  4.  Subdural hematoma.  5.  Hypertension.  6.  Low back pain.  7.  Peptic ulcer disease.  8.  History of surgery in the right arm to release a nerve.   The patient claims he is taking Dilantin 300 mg a day.  In the records, it  appears that the patient may have been on Neurontin 100 mg 3 times a day,  Cymbalta 20 mg twice daily.  Some dose of Lithium as well, and on thiamine  and folate.   Patient has no known allergies.   Smokes a pack of cigarettes a day.  Had been drinking a pint of alcohol a  day prior to alcohol rehab.  Patient did not go through over DTs.   SOCIAL HISTORY:  [place]  He is  married.  Currently is on disability for some reason that the wife does not  know.  The patient has three children who are alive and well.   FAMILY HISTORY:  Father died from suicide.  Mother is alive and well.  Patient has three sisters and two brothers.  One sister has alcoholism.  There is no family history of seizures.   REVIEW OF SYSTEMS:  Notable for complaints of cold feet.  Patient does note  headache today, as he bumped his head.  Feels weak all over.  Denies  shortness of breath, chest pain, abdominal pain, nausea, vomiting, focal  numbness or weakness in the arms or legs.   PHYSICAL EXAMINATION:  VITALS:  Blood pressure is 122/62, heart rate 78,  respiratory rate 16, temperature afebrile.  GENERAL:  This patient is a minimally obese white male who is alert and  cooperative at the time of examination.  HEENT:  Head is atraumatic.  Eyes:  Pupils are equal, round and reactive to  light.  Disks are flat bilaterally.  NECK:  Supple.  No carotid bruits noted.  RESPIRATORY:  Clear.  CARDIOVASCULAR:  Regular rate and rhythm with no obvious murmurs or rubs  noted.  ABDOMEN:  Positive bowel sounds with no organomegaly or tenderness noted.  EXTREMITIES:  Without significant edema.  NEUROLOGIC:  Cranial  nerves as above.  Facial asymmetry is present.  Patient  has good sensation to the face to pinprick and soft touch bilaterally.  He  has good strength to the facial muscles.  Muscles of the head turn to and  fro bilaterally.  Speech is well enunciated.  Not aphasic.  Prominent end-  gaze nystagmus is noted bilaterally.  Patient has good strength in all four  extremities.  Good symmetric motor tone and strength throughout.  Sensory  testing is intact to pinprick, soft touch, and vibratory sensation  throughout.  Patient has good finger/nose/finger and heel-shin bilaterally.  Gait was not tested.  Deep tendon reflexes were symmetric.  The depression  of ankle jerk reflexes noted bilaterally.  Toes are neutral bilaterally.  No  drift is seen.   LABORATORY VALUES:  Dilantin level is 29.3.  Other labs are pending.   IMPRESSION:  1.  History of chronic seizure disorder with recent recurrence.  2.  Dilantin toxicity.  3.  History of alcohol abuse.   This patient has apparently had two seizures today with a Dilantin level of  29.3.  The patient is Dilantin-toxic.  We will need to observe the patient  overnight and regulate the medications a bit better.  Dilantin toxicity can  actually activate seizures.  The patient may do fine if his seizure  medications can be properly managed.   PLAN:  1.  Hold Dilantin for now.  Check levels in the a.m.  2.  Seizure precautions.  3.  MRI of the brain with and without gadolinium.  4.  EEG study.  5.  Will reinstitute Neurontin at 300 mg 3 time a day.  6.  Will follow patient's clinical course while in-house.      Marlan Palau, M.D.  Electronically Signed     CKW/MEDQ  D:  01/07/2006  T:  01/07/2006  Job:  [id number]   cc:   Sharlet Salina, M.D.  Fax: [phone]

## 2011-01-09 NOTE — Discharge Summary (Signed)
NAME:  Mitchell Mccall, Mitchell Mccall                            ACCOUNT NO.:  [id number]   MEDICAL RECORD NO.:  [id number]                   PATIENT TYPE:  INP   LOCATION:  3705                                 FACILITY:  [facility]   PHYSICIAN:  Hollice Espy, M.D.            DATE OF BIRTH:  [birth year]   DATE OF ADMISSION:  01/23/2004  DATE OF DISCHARGE:  01/27/2004                                 DISCHARGE SUMMARY   PRIMARY CARE PHYSICIAN:  Dr. Crista Luria.   CHIEF COMPLAINT:  Alcohol abuse and tremors as well as hallucinations.   This is a 60 year old white male with a past medical history   Dictation ended at this point.                                                Hollice Espy, M.D.    SKK/MEDQ  D:  03/05/2004  T:  03/06/2004  Job:  [id number]

## 2011-01-15 ENCOUNTER — Other Ambulatory Visit: Payer: Self-pay | Admitting: Family Medicine

## 2011-01-15 DIAGNOSIS — R569 Unspecified convulsions: Secondary | ICD-10-CM

## 2011-01-20 ENCOUNTER — Ambulatory Visit
Admission: RE | Admit: 2011-01-20 | Discharge: 2011-01-20 | Disposition: A | Discharge status: Home / Self Care | Payer: Medicare Other | Source: Ambulatory Visit | Attending: Family Medicine | Admitting: Family Medicine

## 2011-01-20 DIAGNOSIS — R569 Unspecified convulsions: Secondary | ICD-10-CM

## 2011-05-26 LAB — BASIC METABOLIC PANEL
BUN: 18
BUN: 20
BUN: 21
BUN: 7
CO2: 20
CO2: 23
CO2: 23
CO2: 26
Calcium: 8.2 — ABNORMAL LOW
Calcium: 8.4
Calcium: 8.7
Chloride: 100
Chloride: 102
Chloride: 105
Chloride: 99
Creatinine, Ser: 0.76
Creatinine, Ser: 0.84
Creatinine, Ser: 0.87
GFR calc Af Amer: >60
GFR calc Af Amer: >60
GFR calc non Af Amer: >60
GFR calc non Af Amer: >60
Glucose, Bld: 101 — ABNORMAL HIGH
Glucose, Bld: 126 — ABNORMAL HIGH
Glucose, Bld: 145 — ABNORMAL HIGH
Glucose, Bld: 196 — ABNORMAL HIGH
Glucose, Bld: 46 — ABNORMAL LOW
Potassium: 3.1 — ABNORMAL LOW
Potassium: 3.4 — ABNORMAL LOW
Potassium: 3.7
Potassium: 3.7
Sodium: 132 — ABNORMAL LOW
Sodium: 135

## 2011-05-26 LAB — GLUCOSE, CAPILLARY
Glucose-Capillary: 128 — ABNORMAL HIGH
Glucose-Capillary: 136 — ABNORMAL HIGH
Glucose-Capillary: 142 — ABNORMAL HIGH
Glucose-Capillary: 185 — ABNORMAL HIGH
Glucose-Capillary: 192 — ABNORMAL HIGH
Glucose-Capillary: 204 — ABNORMAL HIGH
Glucose-Capillary: 242 — ABNORMAL HIGH
Glucose-Capillary: 330 — ABNORMAL HIGH
Glucose-Capillary: 50 — ABNORMAL LOW
Glucose-Capillary: 92

## 2011-05-26 LAB — URINE MICROSCOPIC-ADD ON

## 2011-05-26 LAB — URINALYSIS, ROUTINE W REFLEX MICROSCOPIC
Glucose, UA: >1000 — AB
Ketones, ur: >80 — AB
pH: 6

## 2011-05-26 LAB — DIFFERENTIAL
Basophils Absolute: 0
Basophils Absolute: 0.1
Basophils Relative: 0
Basophils Relative: 0
Basophils Relative: 1
Eosinophils Absolute: 0
Eosinophils Absolute: 0
Eosinophils Absolute: 0
Lymphocytes Relative: 20
Lymphs Abs: 2.3
Monocytes Absolute: 1.2 — ABNORMAL HIGH
Monocytes Relative: 10
Monocytes Relative: 8
Monocytes Relative: 9
Neutro Abs: 16.3 — ABNORMAL HIGH
Neutro Abs: 6.4
Neutro Abs: 9.7 — ABNORMAL HIGH
Neutrophils Relative %: 60
Neutrophils Relative %: 71
Neutrophils Relative %: 84 — ABNORMAL HIGH

## 2011-05-26 LAB — CROSSMATCH: ABO/RH(D): A NEG

## 2011-05-26 LAB — CULTURE, BLOOD (ROUTINE X 2): Culture: NO GROWTH

## 2011-05-26 LAB — COMPREHENSIVE METABOLIC PANEL
ALT: 46
Alkaline Phosphatase: 68
BUN: 23
CO2: 16 — ABNORMAL LOW
Calcium: 9.5
GFR calc non Af Amer: >60
Glucose, Bld: 288 — ABNORMAL HIGH
Potassium: 3.3 — ABNORMAL LOW
Total Protein: 7.4

## 2011-05-26 LAB — CBC
HCT: 41.2
HCT: 52
Hemoglobin: 14.9
Hemoglobin: 17.9 — ABNORMAL HIGH
MCHC: 33.5
MCHC: 34.4
MCHC: 35.1
MCV: 106.9 — ABNORMAL HIGH
MCV: 108.8 — ABNORMAL HIGH
Platelets: 134 — ABNORMAL LOW
RBC: 3.86 — ABNORMAL LOW
RBC: 4.12 — ABNORMAL LOW
RBC: 4.87
RDW: 14.7
RDW: 15
RDW: 15.3
WBC: 11.2 — ABNORMAL HIGH

## 2011-05-26 LAB — POCT I-STAT 3, ART BLOOD GAS (G3+)
Acid-base deficit: 6 — ABNORMAL HIGH
O2 Saturation: 98
pO2, Arterial: 103 — ABNORMAL HIGH

## 2011-05-26 LAB — HEPATIC FUNCTION PANEL
AST: 36
Albumin: 3.1 — ABNORMAL LOW
Total Bilirubin: 1.4 — ABNORMAL HIGH
Total Protein: 6

## 2011-05-26 LAB — AST: AST: 31

## 2011-05-26 LAB — ETHANOL: Alcohol, Ethyl (B): <5

## 2011-05-26 LAB — CK TOTAL AND CKMB (NOT AT ARMC)
CK, MB: 3.5
Relative Index: INVALID
Total CK: 75

## 2011-05-26 LAB — AMYLASE: Amylase: 44

## 2011-05-26 LAB — LIPASE, BLOOD: Lipase: 24

## 2011-05-26 LAB — URINE CULTURE: Colony Count: 3000

## 2011-05-26 LAB — CARDIAC PANEL(CRET KIN+CKTOT+MB+TROPI)
Relative Index: INVALID
Total CK: 75
Troponin I: 0.01

## 2011-05-26 LAB — KETONES, QUALITATIVE

## 2011-05-26 LAB — HEMOGLOBIN A1C: Hgb A1c MFr Bld: 9 — ABNORMAL HIGH

## 2011-06-23 ENCOUNTER — Other Ambulatory Visit: Payer: Self-pay | Admitting: Cardiovascular Disease

## 2011-06-23 ENCOUNTER — Ambulatory Visit
Admission: RE | Admit: 2011-06-23 | Discharge: 2011-06-23 | Disposition: A | Discharge status: Home / Self Care | Payer: Medicare Other | Source: Ambulatory Visit | Attending: Cardiovascular Disease | Admitting: Cardiovascular Disease

## 2011-06-23 DIAGNOSIS — Z01811 Encounter for preprocedural respiratory examination: Secondary | ICD-10-CM

## 2011-06-26 ENCOUNTER — Ambulatory Visit (HOSPITAL_COMMUNITY)
Admission: RE | Admit: 2011-06-26 | Discharge: 2011-06-27 | Disposition: A | Discharge status: Home / Self Care | Payer: Medicare Other | Source: Ambulatory Visit | Attending: Cardiovascular Disease | Admitting: Cardiovascular Disease

## 2011-06-26 DIAGNOSIS — E876 Hypokalemia: Secondary | ICD-10-CM | POA: Insufficient documentation

## 2011-06-26 DIAGNOSIS — J45909 Unspecified asthma, uncomplicated: Secondary | ICD-10-CM | POA: Insufficient documentation

## 2011-06-26 DIAGNOSIS — Z7982 Long term (current) use of aspirin: Secondary | ICD-10-CM | POA: Insufficient documentation

## 2011-06-26 DIAGNOSIS — Z794 Long term (current) use of insulin: Secondary | ICD-10-CM | POA: Insufficient documentation

## 2011-06-26 DIAGNOSIS — F17200 Nicotine dependence, unspecified, uncomplicated: Secondary | ICD-10-CM | POA: Insufficient documentation

## 2011-06-26 DIAGNOSIS — F1010 Alcohol abuse, uncomplicated: Secondary | ICD-10-CM | POA: Insufficient documentation

## 2011-06-26 DIAGNOSIS — E119 Type 2 diabetes mellitus without complications: Secondary | ICD-10-CM | POA: Insufficient documentation

## 2011-06-26 DIAGNOSIS — I708 Atherosclerosis of other arteries: Secondary | ICD-10-CM | POA: Insufficient documentation

## 2011-06-26 DIAGNOSIS — E785 Hyperlipidemia, unspecified: Secondary | ICD-10-CM | POA: Insufficient documentation

## 2011-06-26 DIAGNOSIS — Z79899 Other long term (current) drug therapy: Secondary | ICD-10-CM | POA: Insufficient documentation

## 2011-06-26 DIAGNOSIS — I10 Essential (primary) hypertension: Secondary | ICD-10-CM | POA: Insufficient documentation

## 2011-06-27 LAB — CBC
HCT: 41.9 % (ref 39.0–52.0)
MCH: 35.9 pg — ABNORMAL HIGH (ref 26.0–34.0)
MCV: 100.2 fL — ABNORMAL HIGH (ref 78.0–100.0)
Platelets: 166 10*3/uL (ref 150–400)
RDW: 14.4 % (ref 11.5–15.5)
WBC: 9.6 10*3/uL (ref 4.0–10.5)

## 2011-06-27 LAB — BASIC METABOLIC PANEL
BUN: 6 mg/dL (ref 6–23)
CO2: 27 mEq/L (ref 19–32)
Calcium: 8.9 mg/dL (ref 8.4–10.5)
Chloride: 98 mEq/L (ref 96–112)
Creatinine, Ser: 0.43 mg/dL — ABNORMAL LOW (ref 0.50–1.35)

## 2011-06-28 LAB — GLUCOSE, CAPILLARY
Glucose-Capillary: 223 mg/dL — ABNORMAL HIGH (ref 70–99)
Glucose-Capillary: 168 mg/dL — ABNORMAL HIGH (ref 70–99)

## 2011-06-28 NOTE — Discharge Summary (Signed)
NAME:  Mitchell, BOSSMAN NO.:  [id number]  MEDICAL RECORD NO.:  [id number]  LOCATION:  2501                         FACILITY:  [facility]  PHYSICIAN:  Wilburt Finlay, PA        DATE OF BIRTH:  29-Aug-1950  DATE OF ADMISSION:  06/26/2011 DATE OF DISCHARGE:  06/27/2011                              DISCHARGE SUMMARY   DISCHARGE DIAGNOSES: 1. Peripheral vascular disease status post diamondback atherectomy of     the right common iliac artery.  Probable staged intervention to the     left common iliac. 2. Hypokalemia, repleted. 3. Tobacco abuse. 4. Alcohol abuse. 5. Hypertension. 6. Hyperlipidemia.  HOSPITAL COURSE:  Mitchell Mccall is a 60 year old Caucasian male with history of peripheral vascular disease, tobacco abuse, hypertension, hyperlipidemia, alcohol abuse.  He presented yesterday for abdominal aortography with bifemoral runoff via the right femoral approach.  This was completed with successful diamondback atherectomy to the right common iliac artery.  The patient is currently stable, seen by Dr. Allyson Sabal who is ready for discharge home with followup lower extremity arterial Dopplers.  Return to office visit and plans for possible intervention to the left common iliac in the future.  DISCHARGE LABS:  WBC is 9.6, hemoglobin 15.0, hematocrit 41.9, MCV 100.2, platelets 166,000.  Sodium 136, potassium 3.4, chloride 98, carbon dioxide 27, glucose 157, BUN 6, creatinine 0.43, calcium 8.9.  STUDIES/PROCEDURES:  Abdominal aortography with bifemoral runoff status post diamondback atherectomy to the right common iliac.  DISCHARGE MEDICATIONS: 1. Aspirin 325 mg 1 tablet by mouth daily. 2. Actos 30 mg 1 tablet by mouth daily. 3. Cymbalta 20 mg 1 capsule by mouth twice daily. 4. Dilantin 100 mg 2 tablets in the a.m., 1 at noon, and 3 at bedtime. 5. Folic acid 1 mg 1 tablet by mouth daily. 6. Lantus 50 units in the a.m., 60 units in the p.m. subcutaneously     daily. 7.  Multivitamin 1 tablet by mouth daily. 8. Neurontin 300 mg 1 tablet by mouth 3 times a day. 9. Vitamin B12 1000 mcg 1 tablet by mouth daily. 10.Vitamin D2 50,000 units 1 capsule by mouth weekly. 11.__________ 25,000 units 1 capsule by mouth 3 times a day before     meals.  DISPOSITION:  Mitchell Mccall will be discharged home in stable condition.  It is recommended he increase his activity slowly.  May shower and bathe. No lifting or driving for 3 days.  He is recommended to eat a low sodium, heart healthy diet.  If catheter site becomes red, painful, swollen, or discharges fluid or pus, he is to call our office.  He will follow up for lower extremity arterial Dopplers at our office and then with a follow up appointment with Dr. Allyson Sabal thereafter and our office will call him with the appointment time.          ______________________________ Wilburt Finlay, PA     BH/MEDQ  D:  06/27/2011  T:  06/27/2011  Job:  [id number]  cc:   Nanetta Batty, M.D.

## 2011-08-24 ENCOUNTER — Other Ambulatory Visit: Payer: Self-pay | Admitting: Cardiovascular Disease

## 2011-08-27 ENCOUNTER — Encounter (HOSPITAL_COMMUNITY): Payer: Self-pay | Admitting: Pharmacy Technician

## 2011-08-28 ENCOUNTER — Encounter (HOSPITAL_COMMUNITY)
Admission: RE | Disposition: A | Discharge status: Home / Self Care | Payer: Self-pay | Source: Ambulatory Visit | Attending: Cardiovascular Disease

## 2011-08-28 ENCOUNTER — Ambulatory Visit (HOSPITAL_COMMUNITY)
Admission: RE | Admit: 2011-08-28 | Discharge: 2011-08-29 | Disposition: A | Discharge status: Home / Self Care | Payer: Medicare Other | Source: Ambulatory Visit | Attending: Cardiovascular Disease | Admitting: Cardiovascular Disease

## 2011-08-28 ENCOUNTER — Other Ambulatory Visit: Payer: Self-pay

## 2011-08-28 DIAGNOSIS — I739 Peripheral vascular disease, unspecified: Secondary | ICD-10-CM | POA: Diagnosis present

## 2011-08-28 DIAGNOSIS — I745 Embolism and thrombosis of iliac artery: Secondary | ICD-10-CM | POA: Insufficient documentation

## 2011-08-28 HISTORY — DX: Polyp of colon: K63.5

## 2011-08-28 HISTORY — DX: Essential (primary) hypertension: I10

## 2011-08-28 HISTORY — DX: Low back pain: M54.5

## 2011-08-28 HISTORY — PX: ATHERECTOMY: SHX5502

## 2011-08-28 HISTORY — PX: ABDOMINAL AORTAGRAM: SHX5454

## 2011-08-28 HISTORY — DX: Hemorrhage of anus and rectum: K62.5

## 2011-08-28 HISTORY — DX: Other specified diseases of blood and blood-forming organs: D75.89

## 2011-08-28 HISTORY — DX: Alcohol use, unspecified with withdrawal delirium: F10.931

## 2011-08-28 HISTORY — PX: PERCUTANEOUS STENT INTERVENTION: SHX5500

## 2011-08-28 HISTORY — DX: Anxiety disorder, unspecified: F41.9

## 2011-08-28 HISTORY — DX: Fracture of one rib, unspecified side, initial encounter for closed fracture: S22.39XA

## 2011-08-28 HISTORY — DX: Gastric ulcer, unspecified as acute or chronic, without hemorrhage or perforation: K25.9

## 2011-08-28 HISTORY — DX: Anemia, unspecified: D64.9

## 2011-08-28 HISTORY — DX: Pneumonia, unspecified organism: J18.9

## 2011-08-28 HISTORY — DX: Major depressive disorder, single episode, unspecified: F32.9

## 2011-08-28 HISTORY — DX: Low back pain, unspecified: M54.50

## 2011-08-28 HISTORY — DX: Alcoholic fatty liver: K70.0

## 2011-08-28 HISTORY — DX: Traumatic subdural hemorrhage with loss of consciousness of unspecified duration, initial encounter: S06.5X9A

## 2011-08-28 HISTORY — DX: Other specified forms of tremor: G25.2

## 2011-08-28 HISTORY — DX: Unspecified convulsions: R56.9

## 2011-08-28 HISTORY — DX: Gastro-esophageal reflux disease without esophagitis: K21.9

## 2011-08-28 HISTORY — DX: Peripheral vascular disease, unspecified: I73.9

## 2011-08-28 HISTORY — DX: Depression, unspecified: F32.A

## 2011-08-28 HISTORY — DX: Alcohol dependence with withdrawal delirium: F10.231

## 2011-08-28 HISTORY — DX: Diverticulosis of intestine, part unspecified, without perforation or abscess without bleeding: K57.90

## 2011-08-28 HISTORY — DX: Personal history of other diseases of the digestive system: Z87.19

## 2011-08-28 HISTORY — DX: Undescended testicle, unspecified: Q53.9

## 2011-08-28 HISTORY — DX: Alcohol induced acute pancreatitis without necrosis or infection: K85.20

## 2011-08-28 HISTORY — DX: Other chronic pain: G89.29

## 2011-08-28 LAB — BASIC METABOLIC PANEL
BUN: 3 mg/dL — ABNORMAL LOW (ref 6–23)
CO2: 21 mEq/L (ref 19–32)
Calcium: 9 mg/dL (ref 8.4–10.5)
Creatinine, Ser: 0.41 mg/dL — ABNORMAL LOW (ref 0.50–1.35)
Glucose, Bld: 233 mg/dL — ABNORMAL HIGH (ref 70–99)

## 2011-08-28 LAB — CBC
HCT: 45.2 % (ref 39.0–52.0)
Hemoglobin: 16.5 g/dL (ref 13.0–17.0)
MCH: 37.1 pg — ABNORMAL HIGH (ref 26.0–34.0)
MCV: 101.6 fL — ABNORMAL HIGH (ref 78.0–100.0)
Platelets: 216 10*3/uL (ref 150–400)
RBC: 4.45 MIL/uL (ref 4.22–5.81)

## 2011-08-28 LAB — POCT ACTIVATED CLOTTING TIME: Activated Clotting Time: 171 seconds

## 2011-08-28 LAB — GLUCOSE, CAPILLARY: Glucose-Capillary: 260 mg/dL — ABNORMAL HIGH (ref 70–99)

## 2011-08-28 SURGERY — ATHERECTOMY
Anesthesia: LOCAL

## 2011-08-28 MED ORDER — NITROGLYCERIN 0.2 MG/ML ON CALL CATH LAB
INTRAVENOUS | Status: AC
  Filled 2011-08-28: qty 1

## 2011-08-28 MED ORDER — MORPHINE SULFATE 4 MG/ML IJ SOLN
INTRAMUSCULAR | Status: AC
  Filled 2011-08-28: qty 1

## 2011-08-28 MED ORDER — SUCRALFATE 1 GM/10ML PO SUSP
1.0000 g | Freq: Once | ORAL | Status: AC
  Administered 2011-08-28: 1 g via ORAL
  Filled 2011-08-28 (×2): qty 10

## 2011-08-28 MED ORDER — LORAZEPAM 2 MG/ML IJ SOLN
INTRAMUSCULAR | Status: AC
Start: 2011-08-28 — End: 2011-08-28
  Filled 2011-08-28: qty 1

## 2011-08-28 MED ORDER — FAMOTIDINE IN NACL 20-0.9 MG/50ML-% IV SOLN
20.0000 mg | Freq: Once | INTRAVENOUS | Status: AC
  Administered 2011-08-28: 20 mg via INTRAVENOUS

## 2011-08-28 MED ORDER — MIDAZOLAM HCL 2 MG/2ML IJ SOLN
INTRAMUSCULAR | Status: AC
  Filled 2011-08-28: qty 2

## 2011-08-28 MED ORDER — ONDANSETRON HCL 4 MG/2ML IJ SOLN
INTRAMUSCULAR | Status: AC
  Filled 2011-08-28: qty 2

## 2011-08-28 MED ORDER — DIAZEPAM 5 MG PO TABS
5.0000 mg | ORAL_TABLET | ORAL | Status: AC
  Administered 2011-08-28: 5 mg via ORAL

## 2011-08-28 MED ORDER — ONDANSETRON HCL 4 MG/2ML IJ SOLN
4.0000 mg | Freq: Four times a day (QID) | INTRAMUSCULAR | Status: DC | PRN
  Administered 2011-08-28: 4 mg via INTRAVENOUS
  Filled 2011-08-28: qty 2

## 2011-08-28 MED ORDER — MORPHINE SULFATE 2 MG/ML IJ SOLN
2.0000 mg | INTRAMUSCULAR | Status: DC | PRN
  Administered 2011-08-28 (×2): 2 mg via INTRAVENOUS

## 2011-08-28 MED ORDER — ACETAMINOPHEN 325 MG PO TABS: 650.0000 mg | ORAL_TABLET | ORAL | Status: DC | PRN

## 2011-08-28 MED ORDER — FENTANYL CITRATE 0.05 MG/ML IJ SOLN
INTRAMUSCULAR | Status: AC
  Filled 2011-08-28: qty 2

## 2011-08-28 MED ORDER — PHENYTOIN SODIUM EXTENDED 100 MG PO CAPS: 100.0000 mg | ORAL_CAPSULE | Freq: Three times a day (TID) | ORAL | Status: DC

## 2011-08-28 MED ORDER — OXYCODONE-ACETAMINOPHEN 5-325 MG PO TABS
ORAL_TABLET | ORAL | Status: AC
  Filled 2011-08-28: qty 2

## 2011-08-28 MED ORDER — NITROGLYCERIN IN D5W 200-5 MCG/ML-% IV SOLN
5.0000 ug/min | INTRAVENOUS | Status: DC
  Administered 2011-08-28: 10 ug/min via INTRAVENOUS

## 2011-08-28 MED ORDER — CLOPIDOGREL BISULFATE 300 MG PO TABS
ORAL_TABLET | ORAL | Status: AC
  Administered 2011-08-29: 75 mg via ORAL
  Filled 2011-08-28: qty 1

## 2011-08-28 MED ORDER — FAMOTIDINE IN NACL 20-0.9 MG/50ML-% IV SOLN
20.0000 mg | Freq: Once | INTRAVENOUS | Status: AC
  Administered 2011-08-28: 20 mg via INTRAVENOUS
  Filled 2011-08-28 (×2): qty 50

## 2011-08-28 MED ORDER — PIOGLITAZONE HCL 30 MG PO TABS
30.0000 mg | ORAL_TABLET | Freq: Every day | ORAL | Status: DC
  Administered 2011-08-28 – 2011-08-29 (×2): 30 mg via ORAL
  Filled 2011-08-28 (×2): qty 1

## 2011-08-28 MED ORDER — DIAZEPAM 5 MG PO TABS
ORAL_TABLET | ORAL | Status: AC
  Administered 2011-08-28: 5 mg via ORAL
  Filled 2011-08-28: qty 1

## 2011-08-28 MED ORDER — HYDRALAZINE HCL 20 MG/ML IJ SOLN
INTRAMUSCULAR | Status: AC
  Filled 2011-08-28: qty 1

## 2011-08-28 MED ORDER — GI COCKTAIL ~~LOC~~
30.0000 mL | Freq: Once | ORAL | Status: AC
  Administered 2011-08-28: 30 mL via ORAL
  Filled 2011-08-28: qty 30

## 2011-08-28 MED ORDER — OXYCODONE-ACETAMINOPHEN 5-325 MG PO TABS
1.0000 | ORAL_TABLET | ORAL | Status: DC | PRN
  Administered 2011-08-28: 2 via ORAL

## 2011-08-28 MED ORDER — PANCRELIPASE (LIP-PROT-AMYL) 12000-38000 UNITS PO CPEP
1.0000 | ORAL_CAPSULE | Freq: Three times a day (TID) | ORAL | Status: DC
  Administered 2011-08-29: 1 via ORAL
  Filled 2011-08-28 (×4): qty 1

## 2011-08-28 MED ORDER — SODIUM CHLORIDE 0.9 % IV SOLN
INTRAVENOUS | Status: DC
  Administered 2011-08-28: 13:00:00 via INTRAVENOUS

## 2011-08-28 MED ORDER — PANCRELIPASE (LIP-PROT-AMYL) 25000 UNITS PO CPEP: 1.0000 | ORAL_CAPSULE | Freq: Three times a day (TID) | ORAL | Status: DC

## 2011-08-28 MED ORDER — NITROGLYCERIN 0.4 MG SL SUBL
0.4000 mg | SUBLINGUAL_TABLET | SUBLINGUAL | Status: DC | PRN
  Administered 2011-08-28 (×2): 0.4 mg via SUBLINGUAL

## 2011-08-28 MED ORDER — PHENYTOIN SODIUM EXTENDED 100 MG PO CAPS
200.0000 mg | ORAL_CAPSULE | Freq: Every day | ORAL | Status: DC
  Administered 2011-08-29: 200 mg via ORAL
  Filled 2011-08-28 (×2): qty 2

## 2011-08-28 MED ORDER — LORAZEPAM 1 MG PO TABS
1.0000 mg | ORAL_TABLET | Freq: Four times a day (QID) | ORAL | Status: DC | PRN
  Administered 2011-08-28 – 2011-08-29 (×3): 1 mg via ORAL
  Filled 2011-08-28 (×3): qty 1

## 2011-08-28 MED ORDER — HYDRALAZINE HCL 20 MG/ML IJ SOLN
10.0000 mg | INTRAMUSCULAR | Status: DC | PRN
  Filled 2011-08-28: qty 0.5

## 2011-08-28 MED ORDER — INSULIN GLARGINE 100 UNIT/ML ~~LOC~~ SOLN
50.0000 [IU] | Freq: Two times a day (BID) | SUBCUTANEOUS | Status: DC
  Administered 2011-08-28: 30 [IU] via SUBCUTANEOUS
  Administered 2011-08-29: 50 [IU] via SUBCUTANEOUS
  Filled 2011-08-28: qty 3

## 2011-08-28 MED ORDER — THERA M PLUS PO TABS
1.0000 | ORAL_TABLET | Freq: Every day | ORAL | Status: DC
  Administered 2011-08-29: 1 via ORAL
  Filled 2011-08-28 (×2): qty 1

## 2011-08-28 MED ORDER — INSULIN ASPART 100 UNIT/ML ~~LOC~~ SOLN: 0.0000 [IU] | SUBCUTANEOUS | Status: DC

## 2011-08-28 MED ORDER — PROMETHAZINE HCL 25 MG/ML IJ SOLN: 12.5000 mg | Freq: Once | INTRAMUSCULAR | Status: DC

## 2011-08-28 MED ORDER — VITAMIN B-12 1000 MCG PO TABS
1000.0000 ug | ORAL_TABLET | Freq: Every day | ORAL | Status: DC
  Administered 2011-08-29: 1000 ug via ORAL
  Filled 2011-08-28 (×2): qty 1

## 2011-08-28 MED ORDER — LORAZEPAM 2 MG/ML IJ SOLN
2.0000 mg | Freq: Once | INTRAMUSCULAR | Status: AC
  Administered 2011-08-28: 2 mg via INTRAVENOUS

## 2011-08-28 MED ORDER — DULOXETINE HCL 20 MG PO CPEP
20.0000 mg | ORAL_CAPSULE | Freq: Two times a day (BID) | ORAL | Status: DC
  Administered 2011-08-28 – 2011-08-29 (×2): 20 mg via ORAL
  Filled 2011-08-28 (×3): qty 1

## 2011-08-28 MED ORDER — HEPARIN SODIUM (PORCINE) 1000 UNIT/ML IJ SOLN
INTRAMUSCULAR | Status: AC
  Filled 2011-08-28: qty 1

## 2011-08-28 MED ORDER — SODIUM CHLORIDE 0.9 % IV SOLN
INTRAVENOUS | Status: AC
  Administered 2011-08-28: 17:00:00 via INTRAVENOUS

## 2011-08-28 MED ORDER — FAMOTIDINE IN NACL 20-0.9 MG/50ML-% IV SOLN
INTRAVENOUS | Status: AC
  Filled 2011-08-28: qty 50

## 2011-08-28 MED ORDER — VITAMIN D (ERGOCALCIFEROL) 1.25 MG (50000 UNIT) PO CAPS
50000.0000 [IU] | ORAL_CAPSULE | ORAL | Status: DC
  Filled 2011-08-28: qty 1

## 2011-08-28 MED ORDER — VERAPAMIL HCL 2.5 MG/ML IV SOLN
INTRAVENOUS | Status: AC
  Filled 2011-08-28: qty 2

## 2011-08-28 MED ORDER — GABAPENTIN 300 MG PO CAPS
300.0000 mg | ORAL_CAPSULE | Freq: Three times a day (TID) | ORAL | Status: DC
  Administered 2011-08-28 – 2011-08-29 (×2): 300 mg via ORAL
  Filled 2011-08-28 (×4): qty 1

## 2011-08-28 MED ORDER — FOLIC ACID 1 MG PO TABS
1.0000 mg | ORAL_TABLET | Freq: Every day | ORAL | Status: DC
  Administered 2011-08-29: 1 mg via ORAL
  Filled 2011-08-28 (×2): qty 1

## 2011-08-28 MED ORDER — LIDOCAINE HCL (PF) 1 % IJ SOLN
INTRAMUSCULAR | Status: AC
  Filled 2011-08-28: qty 60

## 2011-08-28 MED ORDER — ASPIRIN 81 MG PO CHEW
CHEWABLE_TABLET | ORAL | Status: AC
  Filled 2011-08-28: qty 4

## 2011-08-28 MED ORDER — PHENYTOIN SODIUM EXTENDED 100 MG PO CAPS
100.0000 mg | ORAL_CAPSULE | ORAL | Status: DC
  Filled 2011-08-28: qty 1

## 2011-08-28 MED ORDER — HEPARIN (PORCINE) IN NACL 2-0.9 UNIT/ML-% IJ SOLN
INTRAMUSCULAR | Status: AC
  Filled 2011-08-28: qty 1000

## 2011-08-28 MED ORDER — NITROGLYCERIN IN D5W 200-5 MCG/ML-% IV SOLN
INTRAVENOUS | Status: AC
  Filled 2011-08-28: qty 250

## 2011-08-28 MED ORDER — HYDRALAZINE HCL 20 MG/ML IJ SOLN
10.0000 mg | INTRAMUSCULAR | Status: DC | PRN
  Administered 2011-08-28: 10 mg via INTRAVENOUS

## 2011-08-28 MED ORDER — SODIUM CHLORIDE 0.9 % IJ SOLN: 3.0000 mL | INTRAMUSCULAR | Status: DC | PRN

## 2011-08-28 MED ORDER — PHENYTOIN SODIUM EXTENDED 100 MG PO CAPS
300.0000 mg | ORAL_CAPSULE | Freq: Every day | ORAL | Status: DC
  Administered 2011-08-28: 300 mg via ORAL
  Filled 2011-08-28 (×2): qty 3

## 2011-08-28 MED ORDER — ONDANSETRON HCL 4 MG/2ML IJ SOLN
4.0000 mg | Freq: Once | INTRAMUSCULAR | Status: AC
  Administered 2011-08-28: 4 mg via INTRAVENOUS

## 2011-08-28 MED ORDER — ERGOCALCIFEROL 1.25 MG (50000 UT) PO CAPS: 50000.0000 [IU] | ORAL_CAPSULE | ORAL | Status: DC

## 2011-08-28 MED ORDER — CLOPIDOGREL BISULFATE 75 MG PO TABS
75.0000 mg | ORAL_TABLET | Freq: Every day | ORAL | Status: DC
  Administered 2011-08-29: 75 mg via ORAL
  Filled 2011-08-28 (×2): qty 1

## 2011-08-28 NOTE — Op Note (Signed)
Mitchell Mccall is a 61 y.o. male    [id number] LOCATION:  FACILITY: [facility]  PHYSICIAN: Nanetta Batty, M.D. [birth year]   DATE OF PROCEDURE:  08/28/2011  DATE OF DISCHARGE:  [facility]  PV Intervention    History obtained from chart review. The patient is a 61 year old married Caucasian male father of 3 graphomotor fibrin children is retired from running an IT trainer. He was referred through the courtesy of Dr. Alvan Dame initially for peripheral vascular evaluation of her claudication and abnormal Doppler studies. He is a risk factor profile positive for a 40-pack-year his 5 year history of tobacco abuse currently smoking one pack a day. He also has not to her diabetes. Dopplers performed in our office 05/27/2011 revealed a right ABI 0.87 the high-frequency signal in the right common iliac artery left ABI was 0.5 to. B. Occluded left common iliac artery. I performed angiography on him 06/26/2011 with him about orbital or facial atherectomy and PTA at the ostium of this highly calcified right common iliac artery with excellent standalone result. Right lower extremity claudication resolved and this Dopplers normalized. He had continued left lower extremity claudication and presents now for attempt at percutaneous vascularization using orbital tissue atherectomy, PT and stenting because the highly calcified nature of the disease.   PROCEDURE DESCRIPTION:    The patient was brought to the second floor  [place] [facility] cath lab in the postabsorptive state.he was premedicated with5 mg of Valium by mouth, IV Versed and fentanyl.Marland Kitchen His right and left groins were prepped and shaved in usual sterile fashion. Xylocaine 1% was used  for local anesthesia. A 5 French sheath was inserted into the right common femoral artery  artery using standard Seldinger technique,a 7 French Bright tip sheath was inserted into the left common femoral artery. units  of 5000 units  of heparin were administered  intravenously.  Visipaque dye was used for the entirety of the case. Retrograde auricular pressures monitored the case. A total of 209 cc of contrast was administered to the patient. Beginning ACT after heparinization was 303 which fell to 180 at the end of the case. A 5 French pigtail catheter was inserted into the right femoral artery and advanced the distal aorta. Abdominal aortogram was performed demonstrating occluded left common iliac artery with reconstitution several centimeters beyond a patent right common iliac artery.    HEMODYNAMICS:    AO SYSTOLIC/AO DIASTOLIC: 166/65   Peripheral intervention: Using a 035 angled Navacross endhole catheter along with an 035 angled stiff Glidewire I was able to cross the lesion and into the distal abdominal aorta. This was verified with pressure tracings and contrast administration. The 0.14 viper wire was then advanced through the [place] cross in place the distal dominant order.diamondback orbital rotational atherectomy was performed of the occluded left common iliac artery using a 1.34millimeter Stealth burr.this was then upgraded to a 2.25 mm predator per and multiple passes were made at 70,000 RPMs up to 90,000 rpm's. Angiography performed via the pigtail catheter and the right common femoral artery placed the distal common aorta revealed a widely patent lumen with significant calcification. PT is performed with a 6 x 4 balloon. Stenting was performed with a 7 x 38 mm long I. Cast covered stent at nominal pressures. Final intravascular result with reduction of a totally occluded highly calcified proximal left common iliac artery to 0% residual excellent flow. The patient had a palpable left pedal pulse at the end of the case.  IMPRESSION:successful Dynabac orbital rotational atherectomy, PTA and covered stenting of a highly calcified segmental lesion occluded proximal left common iliac artery with an excellent angiographic  result. The patient received 300 mg of by mouth Plavix. The sheaths will be removed and pressure will be held the groin to achieve hemostasis. The patient left lateral stable condition. He'll be gently hydrated overnight, to bathroom Plavix and discharged home in the morning. He'll get followup Doppler's office and they'll see him back after that.  Runell Gess MD, [facility] 08/28/2011 3:31 PM

## 2011-08-28 NOTE — H&P (Signed)
  H & P will be scanned in.  Pt was reexamined and existing H & P reviewed. No changes found.  Runell Gess, MD [facility] 08/28/2011 1:35 PM

## 2011-08-29 ENCOUNTER — Encounter (HOSPITAL_COMMUNITY): Payer: Self-pay

## 2011-08-29 DIAGNOSIS — I739 Peripheral vascular disease, unspecified: Secondary | ICD-10-CM | POA: Diagnosis present

## 2011-08-29 LAB — GLUCOSE, CAPILLARY: Glucose-Capillary: 95 mg/dL (ref 70–99)

## 2011-08-29 LAB — CBC
HCT: 43.2 % (ref 39.0–52.0)
Hemoglobin: 15.1 g/dL (ref 13.0–17.0)
MCH: 35.8 pg — ABNORMAL HIGH (ref 26.0–34.0)
MCV: 102.4 fL — ABNORMAL HIGH (ref 78.0–100.0)
Platelets: 192 10*3/uL (ref 150–400)
RBC: 4.22 MIL/uL (ref 4.22–5.81)
WBC: 11.7 10*3/uL — ABNORMAL HIGH (ref 4.0–10.5)

## 2011-08-29 LAB — BASIC METABOLIC PANEL
CO2: 25 mEq/L (ref 19–32)
Calcium: 9 mg/dL (ref 8.4–10.5)
Chloride: 100 mEq/L (ref 96–112)
Glucose, Bld: 94 mg/dL (ref 70–99)
Sodium: 136 mEq/L (ref 135–145)

## 2011-08-29 LAB — TROPONIN I: Troponin I: <0.3 ng/mL (ref <0.30)

## 2011-08-29 MED ORDER — CLOPIDOGREL BISULFATE 75 MG PO TABS: 75.0000 mg | ORAL_TABLET | Freq: Every day | ORAL | Status: DC

## 2011-08-29 MED ORDER — SODIUM CHLORIDE 0.9 % IV SOLN
INTRAVENOUS | Status: DC
  Administered 2011-08-29: 10 mL/h via INTRAVENOUS

## 2011-08-29 MED ORDER — NITROGLYCERIN 0.4 MG SL SUBL: 0.4000 mg | SUBLINGUAL_TABLET | SUBLINGUAL | Status: DC | PRN

## 2011-08-29 MED ORDER — ACETAMINOPHEN 325 MG PO TABS: 650.0000 mg | ORAL_TABLET | ORAL | Status: AC | PRN

## 2011-08-29 MED ORDER — ISOSORBIDE MONONITRATE ER 30 MG PO TB24
30.0000 mg | ORAL_TABLET | Freq: Every day | ORAL | Status: DC
  Administered 2011-08-29: 30 mg via ORAL
  Filled 2011-08-29: qty 1

## 2011-08-29 MED ORDER — NICOTINE 14 MG/24HR TD PT24
14.0000 mg | MEDICATED_PATCH | Freq: Every day | TRANSDERMAL | Status: DC
  Administered 2011-08-29: 14 mg via TRANSDERMAL
  Filled 2011-08-29: qty 1

## 2011-08-29 NOTE — Progress Notes (Signed)
Subjective:  No complaints  Objective:  Vital Signs in the last 24 hours: Temp:  [97.1 F (36.2 C)-97.6 F (36.4 C)] 97.5 F (36.4 C) (01/05 0346) Pulse Rate:  [98-112] 112  (01/05 0500) Resp:  [17-25] 20  (01/05 0500) BP: (110-163)/(38-88) 110/38 mmHg (01/05 0500) SpO2:  [93 %-98 %] 93 % (01/05 0500) Weight:  [63.5 kg (139 lb 15.9 oz)] 139 lb 15.9 oz (63.5 kg) (01/05 0300)  Intake/Output from previous day:  Intake/Output Summary (Last 24 hours) at 08/29/11 0605 Last data filed at 08/29/11 0500  Gross per 24 hour  Intake   1298 ml  Output   1175 ml  Net    123 ml    Physical Exam: General appearance: alert, cooperative and no distress Lungs: scattered rhonchi Heart: regular rate and rhythm no hematoma both groins   Rate: 110  Rhythm: sinus tachycardia  Lab Results:  Basename 08/28/11 1204  WBC 9.9  HGB 16.5  PLT 216    Basename 08/28/11 1204  NA 134*  K 3.6  CL 95*  CO2 21  GLUCOSE 233*  BUN 3*  CREATININE 0.41*   No results found for this basename: TROPONINI:2,CK,MB:2 in the last 72 hours Hepatic Function Panel No results found for this basename: PROT,ALBUMIN,AST,ALT,ALKPHOS,BILITOT,BILIDIR,IBILI in the last 72 hours No results found for this basename: CHOL in the last 72 hours  Basename 08/28/11 1204  INR 0.93    Imaging: No results found.  Cardiac Studies:  Assessment/Plan:   Principal Problem:  *Claudication Active Problems:  PVD (peripheral vascular disease), RCIA PTA 11/12 Chest pain, negative Myoview 10/12 ETOH  Plan- check troponin as he did have chest pain in holding area. Change IV NTG to Imdur, ambulate, hopefully home later today.   Corine Shelter PA-C 08/29/2011, 6:05 AM  I have seen and examined the patient along with Corine Shelter, PA .  I have reviewed the chart, notes and new data.  I agree with PA's note.  Key new complaints: NO pain/spasms in chest. "Had best night's sleep ever".  Key examination changes: Bounding  posterior tibial pulses bilaterally. Minimal ecchymosis in either groin. Key new findings / data: ECG is benign except of mild resting tachycardia (which is a chronic problem, noted during previous office visits, during stress test). It may be partly due to nitrate therapy.  PLAN:  Ambulate. Retrieve cardiac enzyme results. DC home if favorable findings.  Thurmon Fair, MD, [facility] [phone] 08/29/2011, 7:58 AM

## 2011-08-29 NOTE — Progress Notes (Signed)
Nursing Note:  Pt. discharged home with wife via w/c.  Ambulated in hallway w/o difficulty prior to d/c.  PIVs x2 d/c'd with gauze dsgs. applied to sites.  Telemetry monitor also d/c'd. Discharge instructions and prescriptions given to wife and pt. Pt. Awake, alert and oriented.   All questions/concerns addressed prior to discharge.

## 2011-08-29 NOTE — Plan of Care (Signed)
Problem: Consults Goal: Tobacco Cessation referral if indicated Outcome: Progressing Scheduled for 08/29/11

## 2011-08-29 NOTE — Discharge Summary (Signed)
Patient ID: [place] Mccall,  MRN: [id number], DOB/AGE: 63-15-1952 63 y.o.  Admit date: 08/28/2011 Discharge date: 08/29/2011  Primary Care Provider: Dr Duane Lope Primary Cardiologist: Dr Allyson Sabal  Discharge Diagnoses Principal Problem:  *Claudication Active Problems:  PVD (peripheral vascular disease), RCIA PTA 11/12    Procedures: Lt Iliac PTA   Hospital Course The patient is a 61 year old married Caucasian male father of 3 grandfather of four children is retired from running an IT trainer. He was referred through the courtesy of Dr. Alvan Dame initially for peripheral vascular evaluation of her claudication and abnormal Doppler studies. He is a risk factor profile positive for a 40-pack-year his 5 year history of tobacco abuse currently smoking one pack a day. He also has insulin dependent diabetes. Dopplers performed in our office 05/27/2011 revealed a right ABI 0.87 the high-frequency signal in the right common iliac artery,  left ABI was 0.5   I performed angiography on him 06/26/2011 with  atherectomy and PTA at the ostium of this highly calcified right common iliac artery with excellent standalone result. Right lower extremity claudication resolved and his Dopplers normalized. He had continued left lower extremity claudication and presents now for attempt at percutaneous vascularization using orbital tissue atherectomy, PT had   successful Dynabac orbital rotational atherectomy, PTA and covered stenting of a highly calcified segmental lesion occluded proximal left common iliac artery with an excellent angiographic result on 08/28/11. The patient received 300 mg of by mouth Plavix. The sheaths will be removed and pressure will be held the groin to achieve hemostasis. The patient left lateral stable condition. He'll be gently hydrated overnight, to bathroom Plavix and discharged home in the morning. He'll get followup Doppler's office and they'll see him back after that. Post PTA he had  an episode of sscp while swallowing a pill. He was put in the TCU and monitored overnight. His Troponin was negative. We feel he can be d/c'd 1/5 am.     Discharge Vitals:  Blood pressure 139/73, pulse 112, temperature 97.7 F (36.5 C), temperature source Oral, resp. rate 28, height 5\' 9"  (1.753 m), weight 63.5 kg (139 lb 15.9 oz), SpO2 91.00%.    Labs: Results for orders placed during the hospital encounter of 08/28/11 (from the past 48 hour(s))  GLUCOSE, CAPILLARY     Status: Abnormal   Collection Time   08/28/11 11:51 AM      Component Value Range Comment   Glucose-Capillary 234 (*) 70 - 99 (mg/dL)   CBC     Status: Abnormal   Collection Time   08/28/11 12:04 PM      Component Value Range Comment   WBC 9.9  4.0 - 10.5 (K/uL)    RBC 4.45  4.22 - 5.81 (MIL/uL)    Hemoglobin 16.5  13.0 - 17.0 (g/dL)    HCT 40.9  81.1 - 91.4 (%)    MCV 101.6 (*) 78.0 - 100.0 (fL)    MCH 37.1 (*) 26.0 - 34.0 (pg)    MCHC 36.5 (*) 30.0 - 36.0 (g/dL)    RDW 78.2  95.6 - 21.3 (%)    Platelets 216  150 - 400 (K/uL)   BASIC METABOLIC PANEL     Status: Abnormal   Collection Time   08/28/11 12:04 PM      Component Value Range Comment   Sodium 134 (*) 135 - 145 (mEq/L)    Potassium 3.6  3.5 - 5.1 (mEq/L)    Chloride 95 (*) 96 - 112 (mEq/L)  CO2 21  19 - 32 (mEq/L)    Glucose, Bld 233 (*) 70 - 99 (mg/dL)    BUN 3 (*) 6 - 23 (mg/dL)    Creatinine, Ser 9.14 (*) 0.50 - 1.35 (mg/dL)    Calcium 9.0  8.4 - 10.5 (mg/dL)    GFR calc non Af Amer >90  >90 (mL/min)    GFR calc Af Amer >90  >90 (mL/min)   PROTIME-INR     Status: Normal   Collection Time   08/28/11 12:04 PM      Component Value Range Comment   Prothrombin Time 12.7  11.6 - 15.2 (seconds)    INR 0.93  0.00 - 1.49    POCT ACTIVATED CLOTTING TIME     Status: Normal   Collection Time   08/28/11  3:37 PM      Component Value Range Comment   Activated Clotting Time 171     GLUCOSE, CAPILLARY     Status: Abnormal   Collection Time   08/28/11  3:46 PM       Component Value Range Comment   Glucose-Capillary 190 (*) 70 - 99 (mg/dL)   MRSA PCR SCREENING     Status: Normal   Collection Time   08/28/11  6:38 PM      Component Value Range Comment   MRSA by PCR NEGATIVE  NEGATIVE    PHENYTOIN LEVEL, TOTAL     Status: Abnormal   Collection Time   08/28/11  9:21 PM      Component Value Range Comment   Phenytoin Lvl <2.5 (*) 10.0 - 20.0 (ug/mL)   GLUCOSE, CAPILLARY     Status: Abnormal   Collection Time   08/28/11  9:35 PM      Component Value Range Comment   Glucose-Capillary 260 (*) 70 - 99 (mg/dL)   BASIC METABOLIC PANEL     Status: Abnormal   Collection Time   08/29/11  5:55 AM      Component Value Range Comment   Sodium 136  135 - 145 (mEq/L)    Potassium 3.6  3.5 - 5.1 (mEq/L)    Chloride 100  96 - 112 (mEq/L)    CO2 25  19 - 32 (mEq/L)    Glucose, Bld 94  70 - 99 (mg/dL)    BUN 5 (*) 6 - 23 (mg/dL)    Creatinine, Ser 7.82  0.50 - 1.35 (mg/dL)    Calcium 9.0  8.4 - 10.5 (mg/dL)    GFR calc non Af Amer >90  >90 (mL/min)    GFR calc Af Amer >90  >90 (mL/min)   CBC     Status: Abnormal   Collection Time   08/29/11  5:55 AM      Component Value Range Comment   WBC 11.7 (*) 4.0 - 10.5 (K/uL)    RBC 4.22  4.22 - 5.81 (MIL/uL)    Hemoglobin 15.1  13.0 - 17.0 (g/dL)    HCT 95.6  21.3 - 08.6 (%)    MCV 102.4 (*) 78.0 - 100.0 (fL)    MCH 35.8 (*) 26.0 - 34.0 (pg)    MCHC 35.0  30.0 - 36.0 (g/dL)    RDW 57.8  46.9 - 62.9 (%)    Platelets 192  150 - 400 (K/uL)   TROPONIN I     Status: Normal   Collection Time   08/29/11  5:55 AM      Component Value Range Comment   Troponin I <0.30  <  0.30 (ng/mL)   GLUCOSE, CAPILLARY     Status: Normal   Collection Time   08/29/11  7:57 AM      Component Value Range Comment   Glucose-Capillary 95  70 - 99 (mg/dL)     Disposition:  Follow-up Information    Follow up with Runell Gess, MD. (office will call)    Contact information:   [address] [place]  [address] [phone]          Discharge Medications:  Current Discharge Medication List    START taking these medications   Details  acetaminophen (TYLENOL) 325 MG tablet Take 2 tablets (650 mg total) by mouth every 4 (four) hours as needed. Qty: 30 tablet    clopidogrel (PLAVIX) 75 MG tablet Take 1 tablet (75 mg total) by mouth daily with breakfast. Qty: 30 tablet, Refills: 5    nitroGLYCERIN (NITROSTAT) 0.4 MG SL tablet Place 1 tablet (0.4 mg total) under the tongue every 5 (five) minutes x 3 doses as needed for chest pain (if needed for severe chest pain/pressure). Qty: 25 tablet, Refills: 2      CONTINUE these medications which have NOT CHANGED   Details  DULoxetine (CYMBALTA) 20 MG capsule Take 20 mg by mouth 2 (two) times daily.      ergocalciferol (VITAMIN D2) 50000 UNITS capsule Take 50,000 Units by mouth once a week. SUNDAY     folic acid (FOLVITE) 1 MG tablet Take 1 mg by mouth daily.      gabapentin (NEURONTIN) 300 MG capsule Take 300 mg by mouth 3 (three) times daily.      insulin glargine (LANTUS) 100 UNIT/ML injection Inject 50-60 Units into the skin 2 (two) times daily. 50 units in the morning; 60 units at bedtime     Multiple Vitamins-Minerals (MULTIVITAMINS THER. W/MINERALS) TABS Take 1 tablet by mouth daily.      Pancrelipase, Lip-Prot-Amyl, (ZENPEP) 25000 UNITS CPEP Take 1 capsule by mouth 3 (three) times daily before meals.      phenytoin (DILANTIN) 100 MG ER capsule Take 100-300 mg by mouth 3 (three) times daily. 2 capsules in the morning; 1 capsule at noon and 3 capsules at bedtime     pioglitazone (ACTOS) 30 MG tablet Take 30 mg by mouth daily.      vitamin B-12 (CYANOCOBALAMIN) 1000 MCG tablet Take 1,000 mcg by mouth daily.          Outstanding Labs/Studies  Duration of Discharge Encounter: Greater than 30 minutes including physician time.  Jolene Provost PA-C 08/29/2011 8:49 AM

## 2011-09-01 LAB — POCT ACTIVATED CLOTTING TIME: Activated Clotting Time: 303 seconds

## 2011-09-03 ENCOUNTER — Ambulatory Visit (HOSPITAL_COMMUNITY)
Admission: RE | Admit: 2011-09-03 | Discharge: 2011-09-03 | Disposition: A | Discharge status: Home / Self Care | Payer: Medicare Other | Source: Ambulatory Visit | Attending: Cardiovascular Disease | Admitting: Cardiovascular Disease

## 2011-09-03 ENCOUNTER — Encounter: Payer: Self-pay | Admitting: Cardiovascular Disease

## 2011-09-03 ENCOUNTER — Other Ambulatory Visit: Payer: Self-pay | Admitting: Cardiology

## 2011-09-03 DIAGNOSIS — I729 Aneurysm of unspecified site: Secondary | ICD-10-CM | POA: Insufficient documentation

## 2011-09-03 DIAGNOSIS — I724 Aneurysm of artery of lower extremity: Secondary | ICD-10-CM

## 2011-09-03 DIAGNOSIS — R109 Unspecified abdominal pain: Secondary | ICD-10-CM

## 2011-09-03 HISTORY — DX: Aneurysm of artery of lower extremity: I72.4

## 2011-09-03 MED ORDER — DIAZEPAM 5 MG PO TABS
ORAL_TABLET | ORAL | Status: AC
  Filled 2011-09-03: qty 1

## 2011-09-03 MED ORDER — HYDROMORPHONE HCL PF 2 MG/ML IJ SOLN: 1.0000 mg | Freq: Once | INTRAMUSCULAR | Status: DC

## 2011-09-03 MED ORDER — THROMBIN 5000 UNITS EX KIT
5000.0000 [IU] | PACK | Freq: Once | CUTANEOUS | Status: DC
  Filled 2011-09-03: qty 1

## 2011-09-03 MED ORDER — HYDROMORPHONE HCL PF 2 MG/ML IJ SOLN: 0.5000 mg | Freq: Once | INTRAMUSCULAR | Status: DC

## 2011-09-03 MED ORDER — DIAZEPAM 2 MG PO TABS: 5.0000 mg | ORAL_TABLET | Freq: Once | ORAL | Status: DC

## 2011-09-03 MED ORDER — LIDOCAINE-EPINEPHRINE 1 %-1:100000 IJ SOLN
INTRAMUSCULAR | Status: AC
  Filled 2011-09-03: qty 1

## 2011-09-03 MED ORDER — LIDOCAINE HCL (PF) 1 % IJ SOLN
INTRAMUSCULAR | Status: AC
  Filled 2011-09-03: qty 5

## 2011-09-03 MED ORDER — HYDROMORPHONE HCL PF 1 MG/ML IJ SOLN
0.5000 mg | Freq: Once | INTRAMUSCULAR | Status: AC
  Administered 2011-09-03 (×3): 0.5 mg via INTRAVENOUS
  Filled 2011-09-03: qty 1

## 2011-09-03 MED ORDER — PROMETHAZINE HCL 25 MG/ML IJ SOLN: 12.5000 mg | Freq: Once | INTRAMUSCULAR | Status: DC

## 2011-09-03 MED ORDER — HYDROMORPHONE HCL PF 1 MG/ML IJ SOLN
INTRAMUSCULAR | Status: AC
  Administered 2011-09-03: 0.5 mg via INTRAVENOUS
  Filled 2011-09-03: qty 1

## 2011-09-03 MED ORDER — THROMBIN 5000 UNITS EX KIT: 5000.0000 [IU] | PACK | Freq: Once | CUTANEOUS | Status: DC

## 2011-09-03 MED ORDER — PROMETHAZINE HCL 25 MG/ML IJ SOLN
INTRAMUSCULAR | Status: AC
  Administered 2011-09-03: 12.5 mg
  Filled 2011-09-03: qty 1

## 2011-09-03 NOTE — Progress Notes (Signed)
Ultrasound Guided Thrombin Injection  Mitchell Mccall is a 61 year old Caucasian male who recently underwent percutaneous intervention on his left iliac artery last week. In the office today because of leg pain ultrasound revealed a left common femoral pseudoaneurysm. [facility] attempt at ultrasound guided compression which was unsuccessful. He presents now for ultrasound-guided thrombin injection.  Patient received IV for analgesia. His left groin risks prepped and draped in the usual sterile fashion. 1% Xylocaine was used for local anesthesia. Under ultrasound guidance using a 20-gauge spinal needle" 1 cc of thrombin was injected into the pseudoaneurysm sac there remained some flow under color Doppler it was clearly markedly improved compared to the preinjection imaging. He had a palpable pedal pulse at the end of the case.   Impression: Successful ultrasound-guided thrombin injection of 2.5 cm in diameter left common femoral pseudoaneurysm. The patient discharged home at, provided with analgesics. He will get an ultrasound of his groin this coming Monday we'll see back after that in the office  .Runell Gess, M.D., [facility] [address] [place], [place]  [address]  [phone] 09/03/2011 3:24 PM

## 2011-09-03 NOTE — Progress Notes (Signed)
VASCULAR LAB PRELIMINARY    Thrombin injection completed.    Preliminary report:  Pseudoaneurysm is partially thrombosed.  Dr. Allyson Sabal will recheck in office in one week for resolution of pseudo.  Smiley Houseman, 09/03/2011, 3:23 PM     Smiley Houseman 09/03/2011, 3:22 PM

## 2011-09-03 NOTE — Progress Notes (Signed)
61 year old male,  who recently underwent percutaneous intervention on his left iliac artery last week. In the office today because of leg pain ultrasound revealed a left common femoral pseudoaneurysm. He was brought to [facility] hospital today for attempt at ultrasound guided compression which was unsuccessful.  Prior to the ultrasound compression patient received 1 mg IV Dilaudid and during the procedure 1.5 more milligrams of the dilauded, he also received 12.5 mg of IV Phenergan for continued pain and discomfort.. We are unable to compress the pseudoaneurysm. Procedure was stopped and patient was scheduled for ultrasound-guided thrombin injection.  Prior to the thrombin injection patient was given 5 mg of by mouth Valium to help relax the muscles in his groin as well as to relax the patient.  Dr. Allyson Sabal will continue with the ultrasound-guided thrombin injection.

## 2011-09-04 MED FILL — Diazepam Tab 5 MG: ORAL | Qty: 1 | Status: AC

## 2011-09-09 ENCOUNTER — Other Ambulatory Visit: Payer: Self-pay | Admitting: Cardiovascular Disease

## 2011-09-09 MED ORDER — THROMBIN 5000 UNITS EX SOLR: Freq: Once | CUTANEOUS | Status: DC

## 2011-09-10 ENCOUNTER — Ambulatory Visit (HOSPITAL_COMMUNITY)
Admission: RE | Admit: 2011-09-10 | Discharge: 2011-09-10 | Disposition: A | Discharge status: Home / Self Care | Payer: Medicare Other | Source: Ambulatory Visit | Attending: Cardiovascular Disease | Admitting: Cardiovascular Disease

## 2011-09-10 ENCOUNTER — Other Ambulatory Visit: Payer: Self-pay | Admitting: Cardiology

## 2011-09-10 ENCOUNTER — Other Ambulatory Visit: Payer: Self-pay | Admitting: Cardiovascular Disease

## 2011-09-10 DIAGNOSIS — I729 Aneurysm of unspecified site: Secondary | ICD-10-CM

## 2011-09-10 LAB — GLUCOSE, CAPILLARY: Glucose-Capillary: 256 mg/dL — ABNORMAL HIGH (ref 70–99)

## 2011-09-10 MED ORDER — THROMBIN 5000 UNITS EX KIT
5000.0000 [IU] | PACK | CUTANEOUS | Status: DC
  Filled 2011-09-10 (×2): qty 1

## 2011-09-10 MED ORDER — LIDOCAINE HCL (PF) 1 % IJ SOLN
INTRAMUSCULAR | Status: AC
  Filled 2011-09-10: qty 30

## 2011-09-10 MED ORDER — THROMBIN 5000 UNITS EX KIT
5000.0000 [IU] | PACK | Freq: Once | CUTANEOUS | Status: AC
  Administered 2011-09-10: 5000 [IU] via TOPICAL

## 2011-09-10 MED ORDER — HYDROMORPHONE HCL PF 2 MG/ML IJ SOLN
1.0000 mg | INTRAMUSCULAR | Status: DC | PRN
  Administered 2011-09-10 (×2): 1 mg via INTRAVENOUS

## 2011-09-10 MED ORDER — MORPHINE SULFATE 4 MG/ML IJ SOLN: 2.0000 mg | INTRAMUSCULAR | Status: DC | PRN

## 2011-09-10 MED ORDER — LIDOCAINE HCL (CARDIAC) 10 MG/ML IV SOLN
100.0000 mg | Freq: Once | INTRAVENOUS | Status: AC
  Administered 2011-09-10: 100 mg via INTRAVENOUS

## 2011-09-10 MED ORDER — HYDROMORPHONE HCL PF 1 MG/ML IJ SOLN
INTRAMUSCULAR | Status: AC
  Filled 2011-09-10: qty 1

## 2011-09-10 MED ORDER — DIAZEPAM 2 MG PO TABS
5.0000 mg | ORAL_TABLET | Freq: Once | ORAL | Status: AC
  Administered 2011-09-10: 5 mg via ORAL

## 2011-09-10 MED FILL — Diazepam Tab 5 MG: ORAL | Qty: 1 | Status: AC

## 2011-09-10 MED FILL — Lidocaine HCl IV Inj 10 MG/ML: INTRAVENOUS | Qty: 10 | Status: AC

## 2011-09-10 MED FILL — Thrombin For Soln Kit 5000 Unit: CUTANEOUS | Qty: 1 | Status: AC

## 2011-09-10 NOTE — Progress Notes (Signed)
Pt tolerated thrombin injection left groin with Dr Allyson Sabal well. Dois Davenport with vascular, along with Dr Herbie Baltimore present during injection. Pt to lie flat post procedure then discharged in 1 hour.

## 2011-09-10 NOTE — Op Note (Signed)
Procedure note: Ultrasound guided thrombin injection conjunction  Mitchell Mccall is a 61 year old married Caucasian male with history of peripheral vascular occlusive disease status post recent atherectomy, PTA and stenting of the chronically occluded left common iliac artery earlier this month. This procedure drop in his  left groin and was shown to have a left common femoral pseudoaneurysm. He was brought to the hospital approximately a week and underwent ultrasound-guided compression unsuccessfully followed by ultrasound-guided thrombin injection unsuccessfully. He returns today for a second attempt at ultrasound-guided thrombin injection of asymptomatic left common femoral artery pseudoaneurysm  Informed consent was obtained. Patient is total 1.5 mg of intravenous Dilaudid for analgesia. His groin was prepped sterilely. 1 percent Xylocaine was used for local anesthesia. Basal aneurysm was identified using ultrasound it measured approximately 2 cm. The neck measured approximately 4 mm. The aneurysm was then cannulated with a 20-gauge spinal needle. Location within the pseudoaneurysm was documented ultrasonographically using agitated saline. Following this thrombin was slowly infused a spinal needle using a total of cc of thrombin. This aneurysm was showing an ultrasound to be almost completely thrombosed. The patient had 2+ dorsalis pedis and posterior tibial pulses at the end of the procedure.  Impression: Successful ultrasound-guided thrombin injection of a left common femoral artery symptomatic pseudoaneurysm. Patient earlier, per hour discharged home. He will get followup ultrasound in the office early next week and I'll see him back after that and followup.  Runell Gess 09/10/2011 1:04 PM

## 2011-09-10 NOTE — Progress Notes (Signed)
Thrombin injection completed at 12:30.  Recheck completed at 13:30.  Pseudoaneurysm appears partially thrombosed.  The neck is still patent. Smiley Houseman 09/10/2011, 6:11 PM

## 2011-09-11 MED FILL — Lidocaine HCl Local Preservative Free (PF) Inj 1%: INTRAMUSCULAR | Qty: 30 | Status: AC

## 2011-09-11 MED FILL — Hydromorphone HCl Inj 1 MG/ML: INTRAMUSCULAR | Qty: 1 | Status: AC

## 2011-09-14 ENCOUNTER — Encounter: Payer: Self-pay | Admitting: Vascular Surgery

## 2011-09-15 ENCOUNTER — Other Ambulatory Visit: Payer: Self-pay | Admitting: *Deleted

## 2011-09-15 ENCOUNTER — Ambulatory Visit (INDEPENDENT_AMBULATORY_CARE_PROVIDER_SITE_OTHER): Payer: Medicare Other | Admitting: Vascular Surgery

## 2011-09-15 ENCOUNTER — Encounter: Payer: Self-pay | Admitting: Vascular Surgery

## 2011-09-15 ENCOUNTER — Encounter (HOSPITAL_COMMUNITY): Payer: Self-pay | Admitting: *Deleted

## 2011-09-15 VITALS — BP 136/80 | HR 108 | Resp 18 | Ht 69.0 in | Wt 133.0 lb

## 2011-09-15 DIAGNOSIS — I729 Aneurysm of unspecified site: Secondary | ICD-10-CM

## 2011-09-15 NOTE — Progress Notes (Signed)
[facility] [place]   Patient name: Mitchell Mccall MRN: [id number] DOB: [birth year] Sex: male   Referred by: Allyson Sabal  Reason for referral:  Chief Complaint  Patient presents with  . 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 He underwent successful crossing, atherectomy, and stenting of a left common iliac artery occlusion by Dr. Allyson Sabal in [place] January. He had postoperative false aneurysm and underwent thrombin injection on 2 separate occasions. He has persisted with false aneurysm being present and this was documented most recently on ultrasound Dr. Hazle Coca office on 09/10/2010 seen today for continued followup. He has a severe pain in his left leg he isn't reports this is in his left lateral buttocks extending down through his medial thigh. This is to be are persistent and difficult to control. He reports is been present since the procedure. He is quite tearful and very uncomfortable related to this.  Past Medical History  Diagnosis Date  . Peripheral vascular disease   . Diabetes mellitus   . GERD (gastroesophageal reflux disease)   . Seizures   . Stomach ulcer   . Hypertension   . Anxiety   . Depression   . Hepatitis   . H/O hiatal hernia   . Anemia   . Blood transfusion   . Undescended testicle   . Pneumonia DEC 2003  . Colon polyps   . Chronic lower back pain   . Subdural hematoma JULY 2006  . Duodenitis   . Esophagitis   . Coarse tremors   . Diverticulosis   . Rib fracture DEC 2005    Right sided from a fall  . Rectal bleed JULY 2004  . Vitamin D deficiency   . Macrocytosis   . DTs (delirium tremens)   . Claudication in peripheral vascular disease   . Pancreatitis, alcoholic   . Fatty liver, alcoholic   . Hemorrhoids   . Pseudoaneurysm of femoral artery, Lt. with closure /Thrombin injection. 09/03/2011     Past Surgical History  Procedure Date  . Vascular surgery   . Ligament repair 1955    left leg     History   Social History  . Marital Status: Married    Spouse Name: N/A    Number of Children: N/A  . Years of Education: N/A   Occupational History  . Not on file.   Social History Main Topics  . Smoking status: Current Everyday Smoker -- 1.0 packs/day for 50 years    Types: Cigarettes  . Smokeless tobacco: Never Used  . Alcohol Use: 14.4 oz/week    24 Cans of beer per week  . Drug Use: No  . Sexually Active:    Other Topics Concern  . Not on file   Social History Narrative  . No narrative on file    Family History  Problem Relation Age of Onset  . Diabetes Father     Allergies as of 09/15/2011  . (No Known Allergies)    Current Outpatient Prescriptions on File Prior to Visit  Medication Sig Dispense Refill  . DULoxetine (CYMBALTA) 20 MG capsule Take 20 mg by mouth 2 (two) times daily.        . ergocalciferol (VITAMIN D2) 50000 UNITS capsule Take 50,000 Units by mouth once a week. SUNDAY       . folic acid (FOLVITE) 1 MG  tablet Take 1 mg by mouth daily.        Marland Kitchen gabapentin (NEURONTIN) 300 MG capsule Take 300 mg by mouth 3 (three) times daily.        . insulin glargine (LANTUS) 100 UNIT/ML injection Inject 50-60 Units into the skin 2 (two) times daily. 50 units in the morning; 60 units at bedtime       . Multiple Vitamins-Minerals (MULTIVITAMINS THER. W/MINERALS) TABS Take 1 tablet by mouth daily.        . nitroGLYCERIN (NITROSTAT) 0.4 MG SL tablet Place 1 tablet (0.4 mg total) under the tongue every 5 (five) minutes x 3 doses as needed for chest pain (if needed for severe chest pain/pressure).  25 tablet  2  . Pancrelipase, Lip-Prot-Amyl, (ZENPEP) 25000 UNITS CPEP Take 1 capsule by mouth 3 (three) times daily before meals.        . phenytoin (DILANTIN) 100 MG ER capsule Take 100-300 mg by mouth 3 (three) times daily. 2 capsules in the morning; 1 capsule at  noon and 3 capsules at bedtime      . pioglitazone (ACTOS) 30 MG tablet Take 30 mg by mouth daily.        . vitamin B-12 (CYANOCOBALAMIN) 1000 MCG tablet Take 1,000 mcg by mouth daily.        . clopidogrel (PLAVIX) 75 MG tablet Take 1 tablet (75 mg total) by mouth daily with breakfast.  30 tablet  5   Current Facility-Administered Medications on File Prior to Visit  Medication Dose Route Frequency Provider Last Rate Last Dose  . diazepam (VALIUM) tablet 5 mg  5 mg Oral Once Leone Brand, NP      . HYDROmorphone (DILAUDID) injection 0.5 mg  0.5 mg Intravenous Once Leone Brand, NP      . HYDROmorphone (DILAUDID) injection 1 mg  1 mg Intravenous Once Leone Brand, NP      . HYDROmorphone (DILAUDID) injection 1 mg  1 mg Intravenous Once Leone Brand, NP      . HYDROmorphone (DILAUDID) injection 1 mg  1 mg Intravenous Q4H PRN Marykay Lex, MD   1 mg at 09/10/11 1245  . morphine 4 MG/ML injection 2 mg  2 mg Intravenous Q1H PRN Marykay Lex, MD      . promethazine (PHENERGAN) injection 12.5 mg  12.5 mg Intravenous Once Leone Brand, NP         REVIEW OF SYSTEMS:  Positives indicated with an "X"  CARDIOVASCULAR:  [ ]  chest pain   [ ]  chest pressure   [ ]  palpitations   [ ]  orthopnea   [ ]  dyspnea on exertion   [ ]  claudication   [ ]  rest pain   [ ]  DVT   [ ]  phlebitis PULMONARY:   [ ]  productive cough   [ ]  asthma   [ ]  wheezing NEUROLOGIC:   [ ]  weakness  [ ]  paresthesias  [ ]  aphasia  [ ]  amaurosis  [ ]  dizziness HEMATOLOGIC:   [ ]  bleeding problems   [ ]  clotting disorders MUSCULOSKELETAL:  [ ]  joint pain   [ ]  joint swelling GASTROINTESTINAL: [ ]   blood in stool  [ ]   hematemesis GENITOURINARY:  [ ]   dysuria  [ ]   hematuria PSYCHIATRIC:  [ ]  history of major depression INTEGUMENTARY:  [ ]  rashes  [ ]  ulcers CONSTITUTIONAL:  [ ]  fever   [ ]  chills  PHYSICAL EXAMINATION:  General: The  patient is a well-nourished male, in no acute distress. Vital signs are BP 136/80   Pulse 108  Resp 18  Ht 5\' 9"  (1.753 m)  Wt 133 lb (60.328 kg)  BMI 19.64 kg/m2  SpO2 99% Pulmonary: There is a good air exchange bilaterally without wheezing or rales. Abdomen: Soft and non-tender with normal pitch bowel sounds. Musculoskeletal: There are no major deformities.  Does have a palpable false aneurysm in his left groin with severe pain over this area and also throughout his medial thigh. Neurologic: No focal weakness or paresthesias are detected, Skin: There are no ulcer or rashes noted. Psychiatric: The patient has normal affect. Cardiovascular: There is a regular rate and rhythm without significant murmur appreciated.  Vascular lab: [facility] duplex shows persistent left groin false aneurysm    Impression and Plan:  Asst. left femoral false aneurysm despite 2 attempts at thrombin injection. Prolonged discussion with the patient and his wife present. I am unclear as to the etiology of his very severe pain. The patient does understands all in the exam room in cannot get comfortable. I have recommended repair of this false aneurysm I explained that.of that this would call give him any relief since this does not appear to be pressing on his nerves. I reimaged the area of concern with the SonoSite ultrasound this does show 1-2 cm false aneurysm which is directly anterior to the femoral artery. I think procedure would involve small incision over this area with suturing of the colon the artery and closure. I explained that should be done as an outpatient and would be under general anesthesia. She wish to do this as soon as possible and we have added this to the schedule tomorrow in the OR.    Cornisha Zetino F [facility] [place] Office: [phone]

## 2011-09-16 ENCOUNTER — Encounter (HOSPITAL_COMMUNITY): Payer: Self-pay

## 2011-09-16 ENCOUNTER — Encounter (HOSPITAL_COMMUNITY)
Admission: RE | Disposition: A | Discharge status: Home / Self Care | Payer: Self-pay | Source: Ambulatory Visit | Attending: Vascular Surgery

## 2011-09-16 ENCOUNTER — Ambulatory Visit (HOSPITAL_COMMUNITY)
Admission: RE | Admit: 2011-09-16 | Discharge: 2011-09-16 | Disposition: A | Discharge status: Home / Self Care | Payer: Medicare Other | Source: Ambulatory Visit | Attending: Vascular Surgery | Admitting: Vascular Surgery

## 2011-09-16 ENCOUNTER — Ambulatory Visit (HOSPITAL_COMMUNITY): Payer: Medicare Other

## 2011-09-16 DIAGNOSIS — Y849 Medical procedure, unspecified as the cause of abnormal reaction of the patient, or of later complication, without mention of misadventure at the time of the procedure: Secondary | ICD-10-CM | POA: Insufficient documentation

## 2011-09-16 DIAGNOSIS — I724 Aneurysm of artery of lower extremity: Secondary | ICD-10-CM

## 2011-09-16 DIAGNOSIS — I729 Aneurysm of unspecified site: Secondary | ICD-10-CM

## 2011-09-16 DIAGNOSIS — I999 Unspecified disorder of circulatory system: Secondary | ICD-10-CM | POA: Insufficient documentation

## 2011-09-16 HISTORY — PX: FALSE ANEURYSM REPAIR: SHX5152

## 2011-09-16 LAB — COMPREHENSIVE METABOLIC PANEL
ALT: 11 U/L (ref 0–53)
Albumin: 3.5 g/dL (ref 3.5–5.2)
Alkaline Phosphatase: 76 U/L (ref 39–117)
Chloride: 90 mEq/L — ABNORMAL LOW (ref 96–112)
Glucose, Bld: 380 mg/dL — ABNORMAL HIGH (ref 70–99)
Potassium: 4.4 mEq/L (ref 3.5–5.1)
Sodium: 129 mEq/L — ABNORMAL LOW (ref 135–145)
Total Bilirubin: 0.2 mg/dL — ABNORMAL LOW (ref 0.3–1.2)
Total Protein: 7.7 g/dL (ref 6.0–8.3)

## 2011-09-16 LAB — GLUCOSE, CAPILLARY
Glucose-Capillary: 367 mg/dL — ABNORMAL HIGH (ref 70–99)
Glucose-Capillary: 353 mg/dL — ABNORMAL HIGH (ref 70–99)
Glucose-Capillary: 74 mg/dL (ref 70–99)
Glucose-Capillary: 90 mg/dL (ref 70–99)

## 2011-09-16 LAB — PROTIME-INR: INR: 0.93 (ref 0.00–1.49)

## 2011-09-16 LAB — DIFFERENTIAL
Basophils Relative: 1 % (ref 0–1)
Eosinophils Absolute: 0.1 10*3/uL (ref 0.0–0.7)
Eosinophils Relative: 1 % (ref 0–5)
Lymphs Abs: 2.4 10*3/uL (ref 0.7–4.0)
Monocytes Absolute: 1.3 10*3/uL — ABNORMAL HIGH (ref 0.1–1.0)
Monocytes Relative: 13 % — ABNORMAL HIGH (ref 3–12)

## 2011-09-16 LAB — CBC
HCT: 47.4 % (ref 39.0–52.0)
Hemoglobin: 17 g/dL (ref 13.0–17.0)
MCHC: 35.9 g/dL (ref 30.0–36.0)
RDW: 12.6 % (ref 11.5–15.5)
WBC: 9.6 10*3/uL (ref 4.0–10.5)

## 2011-09-16 LAB — URINE MICROSCOPIC-ADD ON

## 2011-09-16 LAB — APTT: aPTT: 28 seconds (ref 24–37)

## 2011-09-16 LAB — URINALYSIS, ROUTINE W REFLEX MICROSCOPIC
Bilirubin Urine: NEGATIVE
Glucose, UA: >1000 mg/dL — AB
Hgb urine dipstick: NEGATIVE
Ketones, ur: 15 mg/dL — AB
Protein, ur: NEGATIVE mg/dL

## 2011-09-16 SURGERY — REPAIR, PSEUDOANEURYSM
Anesthesia: General | Site: Groin | Laterality: Left | Wound class: Clean

## 2011-09-16 MED ORDER — DEXTROSE 50 % IV SOLN
INTRAVENOUS | Status: DC | PRN
  Administered 2011-09-16: 25 mL via INTRAVENOUS

## 2011-09-16 MED ORDER — ONDANSETRON HCL 4 MG/2ML IJ SOLN
INTRAMUSCULAR | Status: DC | PRN
  Administered 2011-09-16: 4 mg via INTRAVENOUS

## 2011-09-16 MED ORDER — SODIUM CHLORIDE 0.9 % IR SOLN
Status: DC | PRN
  Administered 2011-09-16: 09:00:00

## 2011-09-16 MED ORDER — HYDROMORPHONE HCL PF 1 MG/ML IJ SOLN
0.2500 mg | INTRAMUSCULAR | Status: DC | PRN
  Administered 2011-09-16 (×4): 0.5 mg via INTRAVENOUS

## 2011-09-16 MED ORDER — MORPHINE SULFATE 2 MG/ML IJ SOLN
INTRAMUSCULAR | Status: AC
  Filled 2011-09-16: qty 1

## 2011-09-16 MED ORDER — MIDAZOLAM HCL 5 MG/5ML IJ SOLN
INTRAMUSCULAR | Status: DC | PRN
  Administered 2011-09-16: 1 mg via INTRAVENOUS

## 2011-09-16 MED ORDER — SODIUM CHLORIDE 0.9 % IV SOLN: INTRAVENOUS | Status: DC

## 2011-09-16 MED ORDER — MIDAZOLAM HCL 2 MG/2ML IJ SOLN
INTRAMUSCULAR | Status: AC
  Administered 2011-09-16: 2 mg
  Filled 2011-09-16: qty 2

## 2011-09-16 MED ORDER — ONDANSETRON HCL 4 MG/2ML IJ SOLN: 4.0000 mg | Freq: Once | INTRAMUSCULAR | Status: DC | PRN

## 2011-09-16 MED ORDER — LACTATED RINGERS IV SOLN
INTRAVENOUS | Status: DC
  Administered 2011-09-16: 09:00:00 via INTRAVENOUS

## 2011-09-16 MED ORDER — HYDROMORPHONE HCL PF 1 MG/ML IJ SOLN
INTRAMUSCULAR | Status: AC
  Filled 2011-09-16: qty 1

## 2011-09-16 MED ORDER — INSULIN ASPART 100 UNIT/ML ~~LOC~~ SOLN
SUBCUTANEOUS | Status: AC
  Administered 2011-09-16: 5 [IU] via SUBCUTANEOUS
  Filled 2011-09-16: qty 1

## 2011-09-16 MED ORDER — GLYCOPYRROLATE 0.2 MG/ML IJ SOLN
INTRAMUSCULAR | Status: DC | PRN
  Administered 2011-09-16: .6 mg via INTRAVENOUS

## 2011-09-16 MED ORDER — ESMOLOL HCL 10 MG/ML IV SOLN
INTRAVENOUS | Status: DC | PRN
  Administered 2011-09-16: 30 mg via INTRAVENOUS
  Administered 2011-09-16: 20 mg via INTRAVENOUS
  Administered 2011-09-16: 10 mg via INTRAVENOUS

## 2011-09-16 MED ORDER — DEXTROSE 5 % IV SOLN
1.5000 g | INTRAVENOUS | Status: AC
  Administered 2011-09-16: 1.5 g via INTRAVENOUS
  Filled 2011-09-16: qty 1.5

## 2011-09-16 MED ORDER — FENTANYL CITRATE 0.05 MG/ML IJ SOLN
INTRAMUSCULAR | Status: DC | PRN
  Administered 2011-09-16: 100 ug via INTRAVENOUS
  Administered 2011-09-16 (×3): 50 ug via INTRAVENOUS

## 2011-09-16 MED ORDER — PROPOFOL 10 MG/ML IV EMUL
INTRAVENOUS | Status: DC | PRN
  Administered 2011-09-16: 60 mg via INTRAVENOUS
  Administered 2011-09-16: 140 mg via INTRAVENOUS

## 2011-09-16 MED ORDER — MEPERIDINE HCL 25 MG/ML IJ SOLN: 6.2500 mg | INTRAMUSCULAR | Status: DC | PRN

## 2011-09-16 MED ORDER — MORPHINE SULFATE 2 MG/ML IJ SOLN
0.0500 mg/kg | INTRAMUSCULAR | Status: DC | PRN
  Administered 2011-09-16 (×2): 2 mg via INTRAVENOUS

## 2011-09-16 MED ORDER — SODIUM CHLORIDE 0.9 % IR SOLN
Status: DC | PRN
  Administered 2011-09-16: 1

## 2011-09-16 MED ORDER — PROTAMINE SULFATE 10 MG/ML IV SOLN
INTRAVENOUS | Status: DC | PRN
  Administered 2011-09-16: 10 mg via INTRAVENOUS
  Administered 2011-09-16: 50 mg via INTRAVENOUS

## 2011-09-16 MED ORDER — HEPARIN SODIUM (PORCINE) 1000 UNIT/ML IJ SOLN
INTRAMUSCULAR | Status: DC | PRN
  Administered 2011-09-16: 5000 [IU] via INTRAVENOUS

## 2011-09-16 MED ORDER — ACETAMINOPHEN 10 MG/ML IV SOLN: 1000.0000 mg | Freq: Four times a day (QID) | INTRAVENOUS | Status: DC

## 2011-09-16 MED ORDER — INSULIN ASPART 100 UNIT/ML ~~LOC~~ SOLN
5.0000 [IU] | Freq: Once | SUBCUTANEOUS | Status: AC
  Administered 2011-09-16 (×2): 5 [IU] via SUBCUTANEOUS

## 2011-09-16 MED ORDER — OXYCODONE HCL 5 MG PO TABS: 5.0000 mg | ORAL_TABLET | Freq: Four times a day (QID) | ORAL | Status: AC | PRN

## 2011-09-16 MED ORDER — NEOSTIGMINE METHYLSULFATE 1 MG/ML IJ SOLN
INTRAMUSCULAR | Status: DC | PRN
  Administered 2011-09-16: 4 mg via INTRAVENOUS

## 2011-09-16 MED ORDER — LACTATED RINGERS IV SOLN
INTRAVENOUS | Status: DC | PRN
  Administered 2011-09-16 (×2): via INTRAVENOUS

## 2011-09-16 SURGICAL SUPPLY — 60 items
APL SKNCLS STERI-STRIP NONHPOA (GAUZE/BANDAGES/DRESSINGS) ×1
BANDAGE ESMARK 6X9 LF (GAUZE/BANDAGES/DRESSINGS) IMPLANT
BENZOIN TINCTURE PRP APPL 2/3 (GAUZE/BANDAGES/DRESSINGS) ×2 IMPLANT
BNDG CMPR 9X6 STRL LF SNTH (GAUZE/BANDAGES/DRESSINGS)
BNDG ESMARK 6X9 LF (GAUZE/BANDAGES/DRESSINGS)
CANISTER SUCTION 2500CC (MISCELLANEOUS) ×2 IMPLANT
CANNULA VESSEL W/WING WO/VALVE (CANNULA) ×1 IMPLANT
CLIP LIGATING EXTRA MED SLVR (CLIP) ×2 IMPLANT
CLIP LIGATING EXTRA SM BLUE (MISCELLANEOUS) ×2 IMPLANT
CLOTH BEACON ORANGE TIMEOUT ST (SAFETY) ×2 IMPLANT
COVER SURGICAL LIGHT HANDLE (MISCELLANEOUS) ×4 IMPLANT
CUFF TOURNIQUET SINGLE 18IN (TOURNIQUET CUFF) IMPLANT
CUFF TOURNIQUET SINGLE 24IN (TOURNIQUET CUFF) IMPLANT
CUFF TOURNIQUET SINGLE 34IN LL (TOURNIQUET CUFF) IMPLANT
CUFF TOURNIQUET SINGLE 44IN (TOURNIQUET CUFF) IMPLANT
DRAIN SNY 10X20 3/4 PERF (WOUND CARE) IMPLANT
DRAPE WARM FLUID 44X44 (DRAPE) ×2 IMPLANT
DRAPE X-RAY CASS 24X20 (DRAPES) IMPLANT
DRSG COVADERM 4X6 (GAUZE/BANDAGES/DRESSINGS) ×1 IMPLANT
DRSG COVADERM 4X8 (GAUZE/BANDAGES/DRESSINGS) IMPLANT
ELECT REM PT RETURN 9FT ADLT (ELECTROSURGICAL) ×2
ELECTRODE REM PT RTRN 9FT ADLT (ELECTROSURGICAL) ×1 IMPLANT
EVACUATOR SILICONE 100CC (DRAIN) IMPLANT
GLOVE BIOGEL PI IND STRL 6.5 (GLOVE) IMPLANT
GLOVE BIOGEL PI IND STRL 7.0 (GLOVE) IMPLANT
GLOVE BIOGEL PI IND STRL 7.5 (GLOVE) IMPLANT
GLOVE BIOGEL PI INDICATOR 6.5 (GLOVE) ×1
GLOVE BIOGEL PI INDICATOR 7.0 (GLOVE) ×1
GLOVE BIOGEL PI INDICATOR 7.5 (GLOVE) ×5
GLOVE ECLIPSE 6.5 STRL STRAW (GLOVE) ×2 IMPLANT
GLOVE SS BIOGEL STRL SZ 7 (GLOVE) IMPLANT
GLOVE SS BIOGEL STRL SZ 7.5 (GLOVE) ×1 IMPLANT
GLOVE SUPERSENSE BIOGEL SZ 7 (GLOVE) ×1
GLOVE SUPERSENSE BIOGEL SZ 7.5 (GLOVE) ×1
GLOVE SURG SS PI 7.5 STRL IVOR (GLOVE) ×4 IMPLANT
GOWN PREVENTION PLUS XLARGE (GOWN DISPOSABLE) ×3 IMPLANT
GOWN STRL NON-REIN LRG LVL3 (GOWN DISPOSABLE) ×6 IMPLANT
GOWN STRL REIN XL XLG (GOWN DISPOSABLE) ×1 IMPLANT
KIT BASIN OR (CUSTOM PROCEDURE TRAY) ×2 IMPLANT
KIT ROOM TURNOVER OR (KITS) ×2 IMPLANT
NS IRRIG 1000ML POUR BTL (IV SOLUTION) ×4 IMPLANT
PACK PERIPHERAL VASCULAR (CUSTOM PROCEDURE TRAY) ×2 IMPLANT
PAD ARMBOARD 7.5X6 YLW CONV (MISCELLANEOUS) ×4 IMPLANT
PADDING CAST COTTON 6X4 STRL (CAST SUPPLIES) IMPLANT
SET COLLECT BLD 21X3/4 12 (NEEDLE) IMPLANT
STAPLER VISISTAT 35W (STAPLE) IMPLANT
STOPCOCK 4 WAY LG BORE MALE ST (IV SETS) IMPLANT
STRIP CLOSURE SKIN 1/2X4 (GAUZE/BANDAGES/DRESSINGS) ×2 IMPLANT
SUT ETHILON 3 0 PS 1 (SUTURE) IMPLANT
SUT PROLENE 5 0 C 1 24 (SUTURE) IMPLANT
SUT PROLENE 6 0 CC (SUTURE) IMPLANT
SUT VIC AB 2-0 CTX 36 (SUTURE) ×2 IMPLANT
SUT VIC AB 3-0 SH 27 (SUTURE) ×2
SUT VIC AB 3-0 SH 27X BRD (SUTURE) ×1 IMPLANT
TOWEL OR 17X24 6PK STRL BLUE (TOWEL DISPOSABLE) ×4 IMPLANT
TOWEL OR 17X26 10 PK STRL BLUE (TOWEL DISPOSABLE) ×2 IMPLANT
TRAY FOLEY CATH 14FRSI W/METER (CATHETERS) ×2 IMPLANT
TUBING EXTENTION W/L.L. (IV SETS) IMPLANT
UNDERPAD 30X30 INCONTINENT (UNDERPADS AND DIAPERS) ×2 IMPLANT
WATER STERILE IRR 1000ML POUR (IV SOLUTION) ×2 IMPLANT

## 2011-09-16 NOTE — Anesthesia Preprocedure Evaluation (Addendum)
Anesthesia Evaluation  Patient identified by MRN, date of birth, ID band Patient awake    Reviewed: Allergy & Precautions, H&P , NPO status , Patient's Chart, lab work & pertinent test results  Airway Mallampati: II TM Distance: >3 FB Neck ROM: Full    Dental  (+) Teeth Intact and Dental Advisory Given   Pulmonary  clear to auscultation        Cardiovascular Regular Normal    Neuro/Psych    GI/Hepatic   Endo/Other    Renal/GU      Musculoskeletal   Abdominal   Peds  Hematology   Anesthesia Other Findings   Reproductive/Obstetrics                           Anesthesia Physical Anesthesia Plan  ASA: III  Anesthesia Plan: General   Post-op Pain Management:    Induction: Intravenous  Airway Management Planned: LMA  Additional Equipment:   Intra-op Plan:   Post-operative Plan: Extubation in OR  Informed Consent: I have reviewed the patients History and Physical, chart, labs and discussed the procedure including the risks, benefits and alternatives for the proposed anesthesia with the patient or authorized representative who has indicated his/her understanding and acceptance.   Dental advisory given  Plan Discussed with: CRNA, Anesthesiologist and Surgeon  Anesthesia Plan Comments:         Anesthesia Quick Evaluation

## 2011-09-16 NOTE — Preoperative (Signed)
Beta Blockers   Reason not to administer Beta Blockers:Not Applicable 

## 2011-09-16 NOTE — Progress Notes (Signed)
Spoke with Dr. Noreene Larsson about mr Mitchell Mccall unrelenting pain, rated at 10/10.  Patient noted to be asleep when he is left alone.  Orders received for IV tylenol and versed.

## 2011-09-16 NOTE — Progress Notes (Signed)
Report given to Jasmine December RN as primary care giver.

## 2011-09-16 NOTE — Transfer of Care (Signed)
Immediate Anesthesia Transfer of Care Note  Patient: Mitchell Mccall  Procedure(s) Performed:  REPAIR FALSE ANEURYSM  Patient Location: PACU  Anesthesia Type: General  Level of Consciousness: awake, alert  and oriented  Airway & Oxygen Therapy: Patient Spontanous Breathing and Patient connected to nasal cannula oxygen  Post-op Assessment: Report given to PACU RN and Patient moving all extremities X 4  Post vital signs: Reviewed and stable  Complications: No apparent anesthesia complications

## 2011-09-16 NOTE — Anesthesia Postprocedure Evaluation (Signed)
  Anesthesia Post-op Note  Patient: Mitchell Mccall  Procedure(s) Performed:  REPAIR FALSE ANEURYSM  Patient Location: PACU  Anesthesia Type: General  Level of Consciousness: awake, alert  and oriented  Airway and Oxygen Therapy: Patient Spontanous Breathing  Post-op Pain: mild  Post-op Assessment: Post-op Vital signs reviewed, Patient's Cardiovascular Status Stable, Respiratory Function Stable, RESPIRATORY FUNCTION UNSTABLE, No signs of Nausea or vomiting and Pain level controlled  Post-op Vital Signs: Reviewed and stable  Complications: No apparent anesthesia complications

## 2011-09-16 NOTE — Interval H&P Note (Signed)
History and Physical Interval Note:  09/16/2011 9:14 AM  [name]  has presented today for surgery, with the diagnosis of PSEUDO ANY GROIN  The various methods of treatment have been discussed with the patient and family. After consideration of risks, benefits and other options for treatment, the patient has consented to  Procedure(s): REPAIR FALSE ANEURYSM as a surgical intervention .  The patients' history has been reviewed, patient examined, no change in status, stable for surgery.  I have reviewed the patients' chart and labs.  Questions were answered to the patient's satisfaction.     Mitchell Mccall

## 2011-09-16 NOTE — H&P (View-Only) (Signed)
[facility]   Patient name: Mitchell Mccall MRN: [id number] DOB: 10/23/1950 Sex: male   Referred by: Berry  Reason for referral:  Chief Complaint  Patient presents with  . 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 He underwent successful crossing, atherectomy, and stenting of a left common iliac artery occlusion by Dr. Berry in [place] January. He had postoperative false aneurysm and underwent thrombin injection on 2 separate occasions. He has persisted with false aneurysm being present and this was documented most recently on ultrasound Dr. Berry's office on 09/10/2010 seen today for continued followup. He has a severe pain in his left leg he isn't reports this is in his left lateral buttocks extending down through his medial thigh. This is to be are persistent and difficult to control. He reports is been present since the procedure. He is quite tearful and very uncomfortable related to this.  Past Medical History  Diagnosis Date  . Peripheral vascular disease   . Diabetes mellitus   . GERD (gastroesophageal reflux disease)   . Seizures   . Stomach ulcer   . Hypertension   . Anxiety   . Depression   . Hepatitis   . H/O hiatal hernia   . Anemia   . Blood transfusion   . Undescended testicle   . Pneumonia DEC 2003  . Colon polyps   . Chronic lower back pain   . Subdural hematoma JULY 2006  . Duodenitis   . Esophagitis   . Coarse tremors   . Diverticulosis   . Rib fracture DEC 2005    Right sided from a fall  . Rectal bleed JULY 2004  . Vitamin D deficiency   . Macrocytosis   . DTs (delirium tremens)   . Claudication in peripheral vascular disease   . Pancreatitis, alcoholic   . Fatty liver, alcoholic   . Hemorrhoids   . Pseudoaneurysm of femoral artery, Lt. with closure /Thrombin injection. 09/03/2011     Past Surgical History  Procedure Date  . Vascular surgery   . Ligament repair 1955    left leg     History   Social History  . Marital Status: Married    Spouse Name: N/A    Number of Children: N/A  . Years of Education: N/A   Occupational History  . Not on file.   Social History Main Topics  . Smoking status: Current Everyday Smoker -- 1.0 packs/day for 50 years    Types: Cigarettes  . Smokeless tobacco: Never Used  . Alcohol Use: 14.4 oz/week    24 Cans of beer per week  . Drug Use: No  . Sexually Active:    Other Topics Concern  . Not on file   Social History Narrative  . No narrative on file    Family History  Problem Relation Age of Onset  . Diabetes Father     Allergies as of 09/15/2011  . (No Known Allergies)    Current Outpatient Prescriptions on File Prior to Visit  Medication Sig Dispense Refill  . DULoxetine (CYMBALTA) 20 MG capsule Take 20 mg by mouth 2 (two) times daily.        . ergocalciferol (VITAMIN D2) 50000 UNITS capsule Take 50,000 Units by mouth once a week. SUNDAY       . folic acid (FOLVITE) 1 MG   tablet Take 1 mg by mouth daily.        . gabapentin (NEURONTIN) 300 MG capsule Take 300 mg by mouth 3 (three) times daily.        . insulin glargine (LANTUS) 100 UNIT/ML injection Inject 50-60 Units into the skin 2 (two) times daily. 50 units in the morning; 60 units at bedtime       . Multiple Vitamins-Minerals (MULTIVITAMINS THER. W/MINERALS) TABS Take 1 tablet by mouth daily.        . nitroGLYCERIN (NITROSTAT) 0.4 MG SL tablet Place 1 tablet (0.4 mg total) under the tongue every 5 (five) minutes x 3 doses as needed for chest pain (if needed for severe chest pain/pressure).  25 tablet  2  . Pancrelipase, Lip-Prot-Amyl, (ZENPEP) 25000 UNITS CPEP Take 1 capsule by mouth 3 (three) times daily before meals.        . phenytoin (DILANTIN) 100 MG ER capsule Take 100-300 mg by mouth 3 (three) times daily. 2 capsules in the morning; 1 capsule at  noon and 3 capsules at bedtime      . pioglitazone (ACTOS) 30 MG tablet Take 30 mg by mouth daily.        . vitamin B-12 (CYANOCOBALAMIN) 1000 MCG tablet Take 1,000 mcg by mouth daily.        . clopidogrel (PLAVIX) 75 MG tablet Take 1 tablet (75 mg total) by mouth daily with breakfast.  30 tablet  5   Current Facility-Administered Medications on File Prior to Visit  Medication Dose Route Frequency Provider Last Rate Last Dose  . diazepam (VALIUM) tablet 5 mg  5 mg Oral Once Laura R Ingold, NP      . HYDROmorphone (DILAUDID) injection 0.5 mg  0.5 mg Intravenous Once Laura R Ingold, NP      . HYDROmorphone (DILAUDID) injection 1 mg  1 mg Intravenous Once Laura R Ingold, NP      . HYDROmorphone (DILAUDID) injection 1 mg  1 mg Intravenous Once Laura R Ingold, NP      . HYDROmorphone (DILAUDID) injection 1 mg  1 mg Intravenous Q4H PRN David W Harding, MD   1 mg at 09/10/11 1245  . morphine 4 MG/ML injection 2 mg  2 mg Intravenous Q1H PRN David W Harding, MD      . promethazine (PHENERGAN) injection 12.5 mg  12.5 mg Intravenous Once Laura R Ingold, NP         REVIEW OF SYSTEMS:  Positives indicated with an "X"  CARDIOVASCULAR:  [ ] chest pain   [ ] chest pressure   [ ] palpitations   [ ] orthopnea   [ ] dyspnea on exertion   [ ] claudication   [ ] rest pain   [ ] DVT   [ ] phlebitis PULMONARY:   [ ] productive cough   [ ] asthma   [ ] wheezing NEUROLOGIC:   [ ] weakness  [ ] paresthesias  [ ] aphasia  [ ] amaurosis  [ ] dizziness HEMATOLOGIC:   [ ] bleeding problems   [ ] clotting disorders MUSCULOSKELETAL:  [ ] joint pain   [ ] joint swelling GASTROINTESTINAL: [ ]  blood in stool  [ ]  hematemesis GENITOURINARY:  [ ]  dysuria  [ ]  hematuria PSYCHIATRIC:  [ ] history of major depression INTEGUMENTARY:  [ ] rashes  [ ] ulcers CONSTITUTIONAL:  [ ] fever   [ ] chills  PHYSICAL EXAMINATION:  General: The   patient is a well-nourished male, in no acute distress. Vital signs are BP 136/80   Pulse 108  Resp 18  Ht 5' 9" (1.753 m)  Wt 133 lb (60.328 kg)  BMI 19.64 kg/m2  SpO2 99% Pulmonary: There is a good air exchange bilaterally without wheezing or rales. Abdomen: Soft and non-tender with normal pitch bowel sounds. Musculoskeletal: There are no major deformities.  Does have a palpable false aneurysm in his left groin with severe pain over this area and also throughout his medial thigh. Neurologic: No focal weakness or paresthesias are detected, Skin: There are no ulcer or rashes noted. Psychiatric: The patient has normal affect. Cardiovascular: There is a regular rate and rhythm without significant murmur appreciated.  Vascular lab: [facility] duplex shows persistent left groin false aneurysm    Impression and Plan:  Asst. left femoral false aneurysm despite 2 attempts at thrombin injection. Prolonged discussion with the patient and his wife present. I am unclear as to the etiology of his very severe pain. The patient does understands all in the exam room in cannot get comfortable. I have recommended repair of this false aneurysm I explained that.of that this would call give him any relief since this does not appear to be pressing on his nerves. I reimaged the area of concern with the SonoSite ultrasound this does show 1-2 cm false aneurysm which is directly anterior to the femoral artery. I think procedure would involve small incision over this area with suturing of the colon the artery and closure. I explained that should be done as an outpatient and would be under general anesthesia. She wish to do this as soon as possible and we have added this to the schedule tomorrow in the OR.    Aiyonna Lucado F [facility] Office: [phone]         

## 2011-09-16 NOTE — Op Note (Signed)
OPERATIVE REPORT  DATE OF SURGERY: 09/16/2011  PATIENT: Mitchell Mccall, 61 y.o. male MRN: [id number]  DOB: Apr 09, 1951  PRE-OPERATIVE DIAGNOSIS: Left femoral false aneurysm  POST-OPERATIVE DIAGNOSIS:  Same  PROCEDURE: Repair of left femoral false aneurysm  SURGEON:  Gretta Began, M.D.  PHYSICIAN ASSISTANT: Collins  ANESTHESIA:  Gen.  EBL: Less than 50 cc ml  Total I/O In: 2800 [I.V.:2800] Out: 300 [Urine:250; Blood:50]  BLOOD ADMINISTERED: None  DRAINS: None  SPECIMEN: None  COUNTS CORRECT:  YES  PLAN OF CARE: PACU stable   PATIENT DISPOSITION:  PACU - hemodynamically stable  PROCEDURE DETAILS: The patient was seen on January 22 ,2013 in my office for evaluation of left femoral false aneurysm. He undergone successful processing of the left common iliac artery occlusion and stenting. He was noted to have a false aneurysm post procedural. He had 2 different attempts at thrombin injection for control of this and has continued to have a false aneurysm. I recommended surgical repair. The patient has severe pain in his left groin and left lateral hip and buttock and extending throughout his left leg. He was tearful crying and in pain despite narcotic pain medication. I explained to the patient that the likelihood of the false aneurysm was not causing the pain. I recommended repair of the false aneurysm to prevent continued expansion.  Procedure in detail: The patient was taken to the operating room and placed supine position where the area of the left groin was prepped and draped in the usual sterile fashion. An oblique incision was made over the false aneurysm at the level of the inguinal crease. Dissection was continued down to the level of the inguinal ligament and the distal external iliac artery and common femoral artery were exposed for control. Patient was given 5000 units of intravenous heparin after extrication time the artery was occluded proximal to the false aneurysm. The false  aneurysm was then entered and distal back bleeding was controlled with digital pressure. Patient had a roughly 2 cm false aneurysm with a very thick capsule. This was partially resected. The hole in the artery was relatively wide and this was controlled with a 5-0 Prolene figure-of-eight suture. After initial control the artery wall was further dissected in an additional suture was required for hemostasis. The clamps were removed from the common femoral artery above this and that adequate hemostasis was obtained. The patient was given 50 mg of protamine to reverse the heparin. Wound irrigated with saline and hemostasis was obtained with cautery. The wounds were closed with 2-0 Vicryl in several layers in the subcutaneous tissue. Skin was closed with 30 subcutaneous Vicryl stitch. Sterile dressing was applied and the patient was taken to the recovery in stable condition. He will be discharged post PACU   Gretta Began, M.D. 09/16/2011 12:48 PM

## 2011-09-17 ENCOUNTER — Encounter: Payer: Self-pay | Admitting: *Deleted

## 2011-09-28 ENCOUNTER — Encounter: Payer: Self-pay | Admitting: Vascular Surgery

## 2011-09-29 ENCOUNTER — Ambulatory Visit (INDEPENDENT_AMBULATORY_CARE_PROVIDER_SITE_OTHER): Payer: Medicare Other | Admitting: Vascular Surgery

## 2011-09-29 ENCOUNTER — Encounter: Payer: Self-pay | Admitting: Vascular Surgery

## 2011-09-29 VITALS — BP 133/50 | HR 112 | Resp 18 | Ht 69.0 in | Wt 135.5 lb

## 2011-09-29 DIAGNOSIS — I724 Aneurysm of artery of lower extremity: Secondary | ICD-10-CM

## 2011-09-29 NOTE — Progress Notes (Signed)
Patient has today for followup of his left femoral false aneurysm repair on 09/16/2011. This was done as an outpatient. He had significant pain prior to his procedure with nerve irritation with pain in his left hip and buttocks extending down to his medial thigh. This continuous but is slowly improving. He reports that it is episodic.  Physical exam: Well-healed left groin incision with the usual thickening under the incision. There is no expansile mass or evidence of false aneurysm. He does have a 2+ dorsalis pedis pulse on the left.  Impression and plan: Stable expected followup following false aneurysm repair left groin. He can continue to liberalize his activity as pain we'll allow him. He was given a prescription of Percocet 5/325/40 no refills for pain. He will followup with Dr. Allyson Sabal on 10/16/2011 and see [place] on an as-needed basis

## 2011-11-11 ENCOUNTER — Inpatient Hospital Stay (HOSPITAL_COMMUNITY)
Admission: EM | Admit: 2011-11-11 | Discharge: 2011-11-13 | DRG: 445 | Disposition: A | Discharge status: Home / Self Care | Payer: Medicare Other | Attending: Internal Medicine | Admitting: Internal Medicine

## 2011-11-11 ENCOUNTER — Emergency Department (HOSPITAL_COMMUNITY): Payer: Medicare Other

## 2011-11-11 ENCOUNTER — Encounter (HOSPITAL_COMMUNITY): Payer: Self-pay | Admitting: Internal Medicine

## 2011-11-11 ENCOUNTER — Other Ambulatory Visit: Payer: Self-pay

## 2011-11-11 DIAGNOSIS — R1013 Epigastric pain: Secondary | ICD-10-CM

## 2011-11-11 DIAGNOSIS — F10931 Alcohol use, unspecified with withdrawal delirium: Secondary | ICD-10-CM | POA: Diagnosis present

## 2011-11-11 DIAGNOSIS — Z860100 Personal history of colon polyps, unspecified: Secondary | ICD-10-CM

## 2011-11-11 DIAGNOSIS — Z794 Long term (current) use of insulin: Secondary | ICD-10-CM

## 2011-11-11 DIAGNOSIS — I729 Aneurysm of unspecified site: Secondary | ICD-10-CM

## 2011-11-11 DIAGNOSIS — K700 Alcoholic fatty liver: Secondary | ICD-10-CM | POA: Diagnosis present

## 2011-11-11 DIAGNOSIS — I739 Peripheral vascular disease, unspecified: Secondary | ICD-10-CM | POA: Diagnosis present

## 2011-11-11 DIAGNOSIS — F411 Generalized anxiety disorder: Secondary | ICD-10-CM | POA: Diagnosis present

## 2011-11-11 DIAGNOSIS — Z79899 Other long term (current) drug therapy: Secondary | ICD-10-CM

## 2011-11-11 DIAGNOSIS — K8020 Calculus of gallbladder without cholecystitis without obstruction: Principal | ICD-10-CM | POA: Diagnosis present

## 2011-11-11 DIAGNOSIS — I724 Aneurysm of artery of lower extremity: Secondary | ICD-10-CM

## 2011-11-11 DIAGNOSIS — G40909 Epilepsy, unspecified, not intractable, without status epilepticus: Secondary | ICD-10-CM | POA: Diagnosis present

## 2011-11-11 DIAGNOSIS — F10231 Alcohol dependence with withdrawal delirium: Secondary | ICD-10-CM

## 2011-11-11 DIAGNOSIS — F3289 Other specified depressive episodes: Secondary | ICD-10-CM | POA: Diagnosis present

## 2011-11-11 DIAGNOSIS — K861 Other chronic pancreatitis: Secondary | ICD-10-CM | POA: Diagnosis present

## 2011-11-11 DIAGNOSIS — K852 Alcohol induced acute pancreatitis without necrosis or infection: Secondary | ICD-10-CM | POA: Diagnosis present

## 2011-11-11 DIAGNOSIS — K862 Cyst of pancreas: Secondary | ICD-10-CM | POA: Diagnosis present

## 2011-11-11 DIAGNOSIS — F329 Major depressive disorder, single episode, unspecified: Secondary | ICD-10-CM | POA: Diagnosis present

## 2011-11-11 DIAGNOSIS — K863 Pseudocyst of pancreas: Secondary | ICD-10-CM | POA: Diagnosis present

## 2011-11-11 DIAGNOSIS — K219 Gastro-esophageal reflux disease without esophagitis: Secondary | ICD-10-CM | POA: Diagnosis present

## 2011-11-11 DIAGNOSIS — K759 Inflammatory liver disease, unspecified: Secondary | ICD-10-CM | POA: Insufficient documentation

## 2011-11-11 DIAGNOSIS — E118 Type 2 diabetes mellitus with unspecified complications: Secondary | ICD-10-CM | POA: Diagnosis present

## 2011-11-11 DIAGNOSIS — IMO0002 Reserved for concepts with insufficient information to code with codable children: Secondary | ICD-10-CM

## 2011-11-11 DIAGNOSIS — D649 Anemia, unspecified: Secondary | ICD-10-CM | POA: Diagnosis present

## 2011-11-11 DIAGNOSIS — K8050 Calculus of bile duct without cholangitis or cholecystitis without obstruction: Secondary | ICD-10-CM

## 2011-11-11 DIAGNOSIS — F1010 Alcohol abuse, uncomplicated: Secondary | ICD-10-CM | POA: Diagnosis present

## 2011-11-11 DIAGNOSIS — R569 Unspecified convulsions: Secondary | ICD-10-CM | POA: Diagnosis present

## 2011-11-11 DIAGNOSIS — E871 Hypo-osmolality and hyponatremia: Secondary | ICD-10-CM | POA: Diagnosis present

## 2011-11-11 DIAGNOSIS — I10 Essential (primary) hypertension: Secondary | ICD-10-CM | POA: Diagnosis present

## 2011-11-11 DIAGNOSIS — F17200 Nicotine dependence, unspecified, uncomplicated: Secondary | ICD-10-CM | POA: Diagnosis present

## 2011-11-11 DIAGNOSIS — E1165 Type 2 diabetes mellitus with hyperglycemia: Secondary | ICD-10-CM | POA: Diagnosis present

## 2011-11-11 HISTORY — DX: Disease of blood and blood-forming organs, unspecified: D75.9

## 2011-11-11 HISTORY — DX: Pseudocyst of pancreas: K86.3

## 2011-11-11 LAB — POCT I-STAT, CHEM 8
BUN: 5 mg/dL — ABNORMAL LOW (ref 6–23)
Chloride: 97 mEq/L (ref 96–112)
Sodium: 133 mEq/L — ABNORMAL LOW (ref 135–145)
TCO2: 22 mmol/L (ref 0–100)

## 2011-11-11 LAB — GLUCOSE, CAPILLARY: Glucose-Capillary: 290 mg/dL — ABNORMAL HIGH (ref 70–99)

## 2011-11-11 LAB — CBC
HCT: 47.6 % (ref 39.0–52.0)
MCHC: 35.3 g/dL (ref 30.0–36.0)
MCV: 99.4 fL (ref 78.0–100.0)
RDW: 12.5 % (ref 11.5–15.5)

## 2011-11-11 LAB — COMPREHENSIVE METABOLIC PANEL
Albumin: 3.8 g/dL (ref 3.5–5.2)
BUN: 7 mg/dL (ref 6–23)
Calcium: 9.9 mg/dL (ref 8.4–10.5)
Creatinine, Ser: 0.34 mg/dL — ABNORMAL LOW (ref 0.50–1.35)
Total Protein: 7.3 g/dL (ref 6.0–8.3)

## 2011-11-11 LAB — POCT I-STAT TROPONIN I: Troponin i, poc: 0 ng/mL (ref 0.00–0.08)

## 2011-11-11 LAB — CARDIAC PANEL(CRET KIN+CKTOT+MB+TROPI)
CK, MB: 1.8 ng/mL (ref 0.3–4.0)
CK, MB: 1.9 ng/mL (ref 0.3–4.0)
Total CK: 24 U/L (ref 7–232)
Troponin I: <0.3 ng/mL (ref <0.30)

## 2011-11-11 LAB — LIPASE, BLOOD: Lipase: 81 U/L — ABNORMAL HIGH (ref 11–59)

## 2011-11-11 MED ORDER — LORAZEPAM 1 MG PO TABS
1.0000 mg | ORAL_TABLET | Freq: Four times a day (QID) | ORAL | Status: DC | PRN
  Administered 2011-11-11: 1 mg via ORAL
  Filled 2011-11-11: qty 1

## 2011-11-11 MED ORDER — LORAZEPAM 2 MG/ML IJ SOLN: 0.0000 mg | Freq: Two times a day (BID) | INTRAMUSCULAR | Status: DC

## 2011-11-11 MED ORDER — SODIUM CHLORIDE 0.9 % IV SOLN
INTRAVENOUS | Status: DC
  Administered 2011-11-11: 17:00:00 via INTRAVENOUS

## 2011-11-11 MED ORDER — THERA M PLUS PO TABS: 1.0000 | ORAL_TABLET | Freq: Every day | ORAL | Status: DC

## 2011-11-11 MED ORDER — NITROGLYCERIN 0.4 MG SL SUBL: 0.4000 mg | SUBLINGUAL_TABLET | SUBLINGUAL | Status: DC | PRN

## 2011-11-11 MED ORDER — PHENYTOIN SODIUM EXTENDED 100 MG PO CAPS
300.0000 mg | ORAL_CAPSULE | Freq: Every day | ORAL | Status: DC
  Administered 2011-11-11 – 2011-11-12 (×2): 300 mg via ORAL
  Filled 2011-11-11 (×4): qty 3

## 2011-11-11 MED ORDER — IOHEXOL 300 MG/ML  SOLN
20.0000 mL | INTRAMUSCULAR | Status: AC
  Administered 2011-11-11 (×2): 20 mL via ORAL

## 2011-11-11 MED ORDER — PANTOPRAZOLE SODIUM 40 MG IV SOLR
40.0000 mg | Freq: Two times a day (BID) | INTRAVENOUS | Status: DC
  Administered 2011-11-11 – 2011-11-12 (×2): 40 mg via INTRAVENOUS
  Filled 2011-11-11 (×3): qty 40

## 2011-11-11 MED ORDER — FOLIC ACID 1 MG PO TABS
1.0000 mg | ORAL_TABLET | Freq: Every day | ORAL | Status: DC
  Administered 2011-11-11 – 2011-11-13 (×3): 1 mg via ORAL
  Filled 2011-11-11 (×3): qty 1

## 2011-11-11 MED ORDER — VITAMIN B-12 1000 MCG PO TABS
1000.0000 ug | ORAL_TABLET | Freq: Every day | ORAL | Status: DC
  Administered 2011-11-11 – 2011-11-13 (×3): 1000 ug via ORAL
  Filled 2011-11-11 (×3): qty 1

## 2011-11-11 MED ORDER — LORAZEPAM 2 MG/ML IJ SOLN: 1.0000 mg | Freq: Four times a day (QID) | INTRAMUSCULAR | Status: DC | PRN

## 2011-11-11 MED ORDER — INSULIN GLARGINE 100 UNIT/ML ~~LOC~~ SOLN
25.0000 [IU] | Freq: Once | SUBCUTANEOUS | Status: AC
  Administered 2011-11-11: 25 [IU] via SUBCUTANEOUS

## 2011-11-11 MED ORDER — THIAMINE HCL 100 MG/ML IJ SOLN
100.0000 mg | Freq: Every day | INTRAMUSCULAR | Status: DC
  Filled 2011-11-11 (×3): qty 1

## 2011-11-11 MED ORDER — DULOXETINE HCL 20 MG PO CPEP
20.0000 mg | ORAL_CAPSULE | Freq: Two times a day (BID) | ORAL | Status: DC
  Administered 2011-11-11 – 2011-11-13 (×4): 20 mg via ORAL
  Filled 2011-11-11 (×6): qty 1

## 2011-11-11 MED ORDER — OXYCODONE HCL 5 MG PO TABS
5.0000 mg | ORAL_TABLET | ORAL | Status: DC | PRN
  Administered 2011-11-12 – 2011-11-13 (×3): 5 mg via ORAL
  Filled 2011-11-11 (×3): qty 1

## 2011-11-11 MED ORDER — INSULIN GLARGINE 100 UNIT/ML ~~LOC~~ SOLN
50.0000 [IU] | Freq: Every day | SUBCUTANEOUS | Status: DC
  Administered 2011-11-12 – 2011-11-13 (×2): 50 [IU] via SUBCUTANEOUS

## 2011-11-11 MED ORDER — ERGOCALCIFEROL 1.25 MG (50000 UT) PO CAPS: 50000.0000 [IU] | ORAL_CAPSULE | ORAL | Status: DC

## 2011-11-11 MED ORDER — DIPHENHYDRAMINE HCL 50 MG/ML IJ SOLN
25.0000 mg | Freq: Four times a day (QID) | INTRAMUSCULAR | Status: AC | PRN
  Administered 2011-11-11 – 2011-11-12 (×2): 25 mg via INTRAVENOUS
  Filled 2011-11-11 (×2): qty 1

## 2011-11-11 MED ORDER — VITAMIN B-1 100 MG PO TABS
100.0000 mg | ORAL_TABLET | Freq: Every day | ORAL | Status: DC
  Administered 2011-11-11 – 2011-11-13 (×3): 100 mg via ORAL
  Filled 2011-11-11 (×3): qty 1

## 2011-11-11 MED ORDER — INSULIN GLARGINE 100 UNIT/ML ~~LOC~~ SOLN: 50.0000 [IU] | Freq: Every day | SUBCUTANEOUS | Status: DC

## 2011-11-11 MED ORDER — GABAPENTIN 300 MG PO CAPS
300.0000 mg | ORAL_CAPSULE | Freq: Three times a day (TID) | ORAL | Status: DC
  Administered 2011-11-11 – 2011-11-13 (×6): 300 mg via ORAL
  Filled 2011-11-11 (×9): qty 1

## 2011-11-11 MED ORDER — HYDROMORPHONE HCL PF 1 MG/ML IJ SOLN
1.0000 mg | INTRAMUSCULAR | Status: DC | PRN
  Administered 2011-11-11: 1 mg via INTRAVENOUS
  Filled 2011-11-11: qty 1

## 2011-11-11 MED ORDER — VITAMIN D (ERGOCALCIFEROL) 1.25 MG (50000 UNIT) PO CAPS: 50000.0000 [IU] | ORAL_CAPSULE | ORAL | Status: DC

## 2011-11-11 MED ORDER — ONDANSETRON HCL 4 MG/2ML IJ SOLN: 4.0000 mg | Freq: Four times a day (QID) | INTRAMUSCULAR | Status: DC | PRN

## 2011-11-11 MED ORDER — ASPIRIN 81 MG PO CHEW
324.0000 mg | CHEWABLE_TABLET | Freq: Once | ORAL | Status: AC
  Administered 2011-11-11: 324 mg via ORAL
  Filled 2011-11-11: qty 4

## 2011-11-11 MED ORDER — ADULT MULTIVITAMIN W/MINERALS CH
1.0000 | ORAL_TABLET | Freq: Every day | ORAL | Status: DC
  Administered 2011-11-11 – 2011-11-13 (×3): 1 via ORAL
  Filled 2011-11-11 (×3): qty 1

## 2011-11-11 MED ORDER — IOHEXOL 300 MG/ML  SOLN
80.0000 mL | Freq: Once | INTRAMUSCULAR | Status: AC | PRN
  Administered 2011-11-11: 80 mL via INTRAVENOUS

## 2011-11-11 MED ORDER — ONDANSETRON HCL 4 MG/2ML IJ SOLN
4.0000 mg | Freq: Once | INTRAMUSCULAR | Status: AC
  Administered 2011-11-11: 4 mg via INTRAVENOUS
  Filled 2011-11-11: qty 2

## 2011-11-11 MED ORDER — NICOTINE 14 MG/24HR TD PT24
14.0000 mg | MEDICATED_PATCH | Freq: Every day | TRANSDERMAL | Status: DC
  Administered 2011-11-11 – 2011-11-13 (×3): 14 mg via TRANSDERMAL
  Filled 2011-11-11 (×3): qty 1

## 2011-11-11 MED ORDER — PHENYTOIN SODIUM EXTENDED 100 MG PO CAPS
100.0000 mg | ORAL_CAPSULE | Freq: Every day | ORAL | Status: DC
  Administered 2011-11-12: 100 mg via ORAL
  Filled 2011-11-11 (×2): qty 1

## 2011-11-11 MED ORDER — ENOXAPARIN SODIUM 40 MG/0.4ML ~~LOC~~ SOLN
40.0000 mg | SUBCUTANEOUS | Status: DC
  Administered 2011-11-11 – 2011-11-12 (×2): 40 mg via SUBCUTANEOUS
  Filled 2011-11-11 (×3): qty 0.4

## 2011-11-11 MED ORDER — HYDROMORPHONE HCL PF 1 MG/ML IJ SOLN
1.0000 mg | Freq: Once | INTRAMUSCULAR | Status: AC
  Administered 2011-11-11: 1 mg via INTRAVENOUS
  Filled 2011-11-11: qty 1

## 2011-11-11 MED ORDER — ASPIRIN EC 325 MG PO TBEC
325.0000 mg | DELAYED_RELEASE_TABLET | Freq: Every day | ORAL | Status: DC
  Administered 2011-11-11 – 2011-11-13 (×3): 325 mg via ORAL
  Filled 2011-11-11 (×4): qty 1

## 2011-11-11 MED ORDER — MORPHINE SULFATE 2 MG/ML IJ SOLN
1.0000 mg | INTRAMUSCULAR | Status: DC | PRN
  Administered 2011-11-11: 2 mg via INTRAVENOUS
  Filled 2011-11-11: qty 1

## 2011-11-11 MED ORDER — ADULT MULTIVITAMIN W/MINERALS CH
1.0000 | ORAL_TABLET | Freq: Every day | ORAL | Status: DC
  Filled 2011-11-11: qty 1

## 2011-11-11 MED ORDER — PHENYTOIN SODIUM EXTENDED 100 MG PO CAPS
200.0000 mg | ORAL_CAPSULE | Freq: Every day | ORAL | Status: DC
  Administered 2011-11-12 – 2011-11-13 (×2): 200 mg via ORAL
  Filled 2011-11-11 (×3): qty 2

## 2011-11-11 MED ORDER — ONDANSETRON HCL 4 MG PO TABS: 4.0000 mg | ORAL_TABLET | Freq: Four times a day (QID) | ORAL | Status: DC | PRN

## 2011-11-11 MED ORDER — FOLIC ACID 1 MG PO TABS
1.0000 mg | ORAL_TABLET | Freq: Every day | ORAL | Status: DC
  Administered 2011-11-11: 1 mg via ORAL
  Filled 2011-11-11 (×3): qty 1

## 2011-11-11 MED ORDER — ACETAMINOPHEN 650 MG RE SUPP: 650.0000 mg | Freq: Four times a day (QID) | RECTAL | Status: DC | PRN

## 2011-11-11 MED ORDER — PHENYTOIN SODIUM EXTENDED 100 MG PO CAPS
100.0000 mg | ORAL_CAPSULE | Freq: Three times a day (TID) | ORAL | Status: DC
  Filled 2011-11-11 (×2): qty 3

## 2011-11-11 MED ORDER — INSULIN ASPART 100 UNIT/ML ~~LOC~~ SOLN
0.0000 [IU] | Freq: Three times a day (TID) | SUBCUTANEOUS | Status: DC
  Administered 2011-11-11: 5 [IU] via SUBCUTANEOUS
  Administered 2011-11-12: 2 [IU] via SUBCUTANEOUS
  Administered 2011-11-12 (×2): 5 [IU] via SUBCUTANEOUS

## 2011-11-11 MED ORDER — ACETAMINOPHEN 325 MG PO TABS
650.0000 mg | ORAL_TABLET | Freq: Four times a day (QID) | ORAL | Status: DC | PRN
  Administered 2011-11-13: 650 mg via ORAL
  Filled 2011-11-11: qty 2

## 2011-11-11 MED ORDER — LORAZEPAM 2 MG/ML IJ SOLN: 0.0000 mg | Freq: Four times a day (QID) | INTRAMUSCULAR | Status: DC

## 2011-11-11 NOTE — H&P (Signed)
Patient's PCP: Mitchell Floro, MD, MD  Chief Complaint: Epigastric pain/right upper quadrant pain  History of Present Illness: Mitchell Mccall is a 61 y.o. Caucasian male with history of diabetes, peripheral vascular disease, GERD, hypertension, seizure disorder, anxiety, history of alcohol use, history of delirium tremens, and history of alcoholic pancreatitis who presents with the above complaints.  Patient reports that he has been having epigastric/right upper quadrant pain for approximately 6 weeks but has gotten worse over the last 3-4 days.  He is noted that he has taken Pepto-Bismol for this pain with minimal relief.  Over the last 24 hours he has noted worsening abdominal pain with vomiting yesterday as a result presented to the emergency department for further evaluation.  Patient while in the emergency department had an episode of questionable syncope/staring spell with a rapid blinking which per wife is like his typical seizure, wife and patient unsure how long this lasted for.  Patient has been feeling warm but has not checked his temperature, denies any chills.  Denies any shortness of breath.  Reported that he had diarrhea one week ago.  Denies any headaches or vision changes.  Past Medical History  Diagnosis Date  . Peripheral vascular disease   . Diabetes mellitus   . GERD (gastroesophageal reflux disease)   . Seizures   . Stomach ulcer   . Hypertension   . Anxiety   . Depression   . Hepatitis   . H/O hiatal hernia   . Anemia   . Blood transfusion   . Undescended testicle   . Pneumonia DEC 2003  . Colon polyps   . Chronic lower back pain   . Subdural hematoma JULY 2006  . Duodenitis   . Esophagitis   . Coarse tremors   . Diverticulosis   . Rib fracture DEC 2005    Right sided from a fall  . Rectal bleed JULY 2004  . Vitamin d deficiency   . Macrocytosis   . DTs (delirium tremens)   . Claudication in peripheral vascular disease   . Pancreatitis, alcoholic   .  Fatty liver, alcoholic   . Hemorrhoids   . 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 Pancreatic pseudocyst    Past Surgical History  Procedure Date  . Vascular surgery   . Ligament repair 1955    left leg   . False aneurysm repair 09/16/2011    Procedure: REPAIR FALSE ANEURYSM;  Surgeon: Larina Earthly, MD;  Location: [facility] OR;  Service: Vascular;  Laterality: Left;   Family History  Problem Relation Age of Onset  . Diabetes Father   . Coronary artery disease Mother    History   Social History  . Marital Status: Married    Spouse Name: N/A    Number of Children: N/A  . Years of Education: N/A   Occupational History  . Not on file.   Social History Main Topics  . Smoking status: Current Everyday Smoker -- 1.0 packs/day for 50 years    Types: Cigarettes  . Smokeless tobacco: Never Used  . Alcohol Use: 14.4 oz/week    24 Cans of beer per week  . Drug Use: No  . Sexually Active:    Other Topics Concern  . Not on file   Social History Narrative  . No narrative on file   Allergies: Review of patient's allergies indicates no known allergies.  Meds: Scheduled Meds:   . aspirin  324 mg Oral Once  .  HYDROmorphone (DILAUDID) injection  1 mg Intravenous Once  .  HYDROmorphone (DILAUDID) injection  1 mg Intravenous Once  .  HYDROmorphone (DILAUDID) injection  1 mg Intravenous Once  . iohexol  20 mL Oral Q1 Hr x 2  . ondansetron (ZOFRAN) IV  4 mg Intravenous Once   Continuous Infusions:  PRN Meds:.  Review of Systems: All systems reviewed with the patient and positive as per history of present illness, otherwise all other systems are negative.  Physical Exam: Blood pressure 143/50, pulse 83, resp. rate 14, SpO2 99.00%. General: Awake, Oriented x3, No acute distress. HEENT: EOMI, Moist mucous membranes Neck: Supple CV: S1 and S2 Lungs: Clear to ascultation bilaterally Abdomen: Soft, tender in the right upper quadrant,  Nondistended, +bowel sounds, no guarding or rebound tenderness. Ext: Good pulses. Trace edema. No clubbing or cyanosis noted. Neuro: Cranial Nerves II-XII grossly intact. Has 5/5 motor strength in upper and lower extremities.  Lab results:  [facility] 11/11/11 0838 11/11/11 0828  NA 133* 133*  K 4.2 4.3  CL 97 91*  CO2 -- 21  GLUCOSE 353* 338*  BUN 5* 7  CREATININE 0.60 0.34*  CALCIUM -- 9.9  MG -- --  PHOS -- --    Basename 11/11/11 0828  AST 14  ALT 10  ALKPHOS 66  BILITOT 0.3  PROT 7.3  ALBUMIN 3.8    Basename 11/11/11 0828  LIPASE 81*  AMYLASE --    Basename 11/11/11 0838 11/11/11 0828  WBC -- 13.6*  NEUTROABS -- --  HGB 17.7* 16.8  HCT 52.0 47.6  MCV -- 99.4  PLT -- 246   No results found for this basename: CKTOTAL:3,CKMB:3,CKMBINDEX:3,TROPONINI:3 in the last 72 hours No components found with this basename: POCBNP:3 No results found for this basename: DDIMER in the last 72 hours No results found for this basename: HGBA1C:2 in the last 72 hours No results found for this basename: CHOL:2,HDL:2,LDLCALC:2,TRIG:2,CHOLHDL:2,LDLDIRECT:2 in the last 72 hours No results found for this basename: TSH,T4TOTAL,FREET3,T3FREE,THYROIDAB in the last 72 hours No results found for this basename: VITAMINB12:2,FOLATE:2,FERRITIN:2,TIBC:2,IRON:2,RETICCTPCT:2 in the last 72 hours Imaging results:  US Abdomen Complete  11/11/2011  *RADIOLOGY REPORT*  Clinical Data:  Abdominal pain  COMPLETE ABDOMINAL ULTRASOUND  Comparison:  CT abdomen pelvis of 05/01/2010  Findings:  Gallbladder:  Small nonshadowing gallstones are present within the gallbladder.  There is no pain of the gallbladder with compression.  Common bile duct:  The common bile duct is prominent measuring 10.9 mm in diameter although no definite calculus is evident.  Liver:  The liver has a normal echogenic pattern.  No ductal dilatation is seen.  IVC:  Appears normal.  Pancreas:  There is a complex cystic structure in the neck -  proximal body of the pancreas measuring 5.6 x 4.1 x 5.6 cm consistent with pancreatic pseudocyst with calcifications.  The more distal pancreas is difficult to evaluate due to bowel gas.  Spleen:  The spleen is normal measuring 5.6 cm sagittally.  Right Kidney:  No hydronephrosis is seen.  The right kidney measures 12.4 cm sagittally.  Left Kidney:  No hydronephrosis is noted.  The left kidney measures 10.6 cm.  A small cyst is present in the lower pole of 0.9 mm in diameter.  Abdominal aorta:  The aorta is partially obscured by bowel gas.  No aneurysm is evident.  IMPRESSION:  1.  Probable pancreatic pseudocyst in the neck - proximal body of 5.6 cm. 2.  Small nonshadowing gallstones.  No pain over the gallbladder.  3.  Prominent common bile duct.  No definite common bile duct calculus.  Correlate with LFTs.  Original Report Authenticated By: Juline Patch, M.D.   Dg Chest Port 1 View  11/11/2011  *RADIOLOGY REPORT*  Clinical Data: Right-sided chest pain, history of smoking, diabetes, seizures  PORTABLE CHEST - 1 VIEW  Comparison: Chest x-ray of 06/23/2011  Findings: The lungs are clear and hyperaerated.  Mediastinal contours appear normal.  The heart is within upper limits of normal.  No bony abnormality is seen.  IMPRESSION: No active lung disease.  No change in slight hyperaeration.  Original Report Authenticated By: Juline Patch, M.D.   Other results: EKG: unchanged from previous tracings, sinus rhythm.  Assessment & Plan by Problem: 1. Epigastric pain/right upper quadrant pain.  Lipase is mildly elevated.  Abdominal ultrasound showed pancreatic pseudocyst in the neck-proximal body measuring 5.6 cm, small non-shadowing gallstones noted, prominent common bile duct noted.  Patient may have acute on chronic pancreatitis versus possible gallstone attack.  GI Dr. Matthias Hughs has evaluated the patient. CA 19-9 ordered as per GI.  Depending on patient's clinical course may need surgical evaluation for  cholecystectomy.  Will have the patient on telemetry and trend troponins if no events on telemetry discontinued telemetry and 24 hours.  CT of the abdomen and pelvis done.  2.  Pancreatic pseudocyst.  Management conservatively as per GI.  3.  History of peripheral vascular disease.  Stable.  4.  GERD.  Will have the patient on PPI twice a day.  Will defer to GI if a repeat EGD is needed.  5.  Hyponatremia.  Likely due to uncontrolled diabetes.  6.  Uncontrolled type 2 diabetes with complications.  Continue Lantus and moderate sliding scale insulin.  7.  History of anemia.  Resolved.  8.  History of alcohol abuse.  Counseled the patient on alcohol cessation.  Started the patient on CIWA protocol  9.  Tobacco abuse.  Again counseled the patient on cessation.  Nicotine patch ordered.  10.  History of seizures.  With questionable episode of seizure while in the emergency department.  Continue Dilantin.  Continue frequent neuro checks.  11.  Hypertension.  Stable.  11.  Prophylaxis.  Lovenox.  12.  CODE STATUS.  Full code.  Discussed with patient and family member at the time of admission.  Time spent on admission, talking to the patient, and coordinating care was: 60 mins.  Jamani Eley A, MD 11/11/2011, 2:44 PM

## 2011-11-11 NOTE — ED Notes (Signed)
Pt given ice chips

## 2011-11-11 NOTE — Consult Note (Signed)
Referring Provider: Dr. Betti Cruz ([facility]) Primary Care Physician:  [name], MD, MD Primary Gastroenterologist:  Dr.Magod  Reason for Consultation:  Abdominal pain  HPI: Mitchell Mccall is a 61 y.o. male known to my partners, Doctors [place] and [place], who has a several day history of nausea and then, yesterday evening, he began to have severe, progressively intense right upper quadrant pain, without radiation, fevers, or chills, but with some vomiting. It lasted all through the night and was fairly steady in character. It is doing somewhat better since coming to the emergency room today. An ultrasound shows multiple gallstones as well as a somewhat larger pseudocyst that had previously been noted. His common bile duct is somewhat enlarged at 11 mm, but liver chemistries are normal. Mild elevation of lipase at 81.  The patient has a past history of significant ethanol abuse, but he indicates that now he only drinks a few beers on days when the car raises are on. There is no history of binging on alcohol prior to this admission.  The patient had endoscopic ultrasound in October 2011, at which time his cystic lesion of the pancreas was aspirated and contained no malignant cells. It was felt to be a benign pseudocyst. He did have extensive changes of chronic pancreatitis at that time, plus what appeared to be a normal gallbladder. Interestingly, an MRI prior to that exam had raise the question of a 1.2 cm calculus in the main pancreatic duct which was also seen on the EUS. The cystic lesion of the pancreas at that time was only about 1.3 cm, whereas on the current ultrasound, it is measured as 5.6 cm in diameter.  The patient indicates that at this time, his pain is much better than it was last night.   Past Medical History  Diagnosis Date  . Peripheral vascular disease   . Diabetes mellitus   . GERD (gastroesophageal reflux disease)   . Seizures   . Stomach ulcer   . Hypertension     . Anxiety   . Depression   . Hepatitis   . H/O hiatal hernia   . Anemia   . Blood transfusion   . Undescended testicle   . Pneumonia DEC 2003  . Colon polyps   . Chronic lower back pain   . Subdural hematoma JULY 2006  . Duodenitis   . Esophagitis   . Coarse tremors   . Diverticulosis   . Rib fracture DEC 2005    Right sided from a fall  . Rectal bleed JULY 2004  . Vitamin d deficiency   . Macrocytosis   . DTs (delirium tremens)   . Claudication in peripheral vascular disease   . Pancreatitis, alcoholic   . Fatty liver, alcoholic   . Hemorrhoids   . 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 Pancreatic pseudocyst     Past Surgical History  Procedure Date  . Vascular surgery   . Ligament repair 1955    left leg   . False aneurysm repair 09/16/2011    Procedure: REPAIR FALSE ANEURYSM;  Surgeon: Larina Earthly, MD;  Location: [facility] OR;  Service: Vascular;  Laterality: Left;    Prior to Admission medications   Medication Sig Start Date End Date Taking? Authorizing Provider  DULoxetine (CYMBALTA) 20 MG capsule Take 20 mg by mouth 2 (two) times daily.    Yes Historical Provider, MD  ergocalciferol (VITAMIN D2) 50000 UNITS capsule Take 50,000 Units by mouth once  a week. SUNDAY   Yes Historical Provider, MD  folic acid (FOLVITE) 1 MG tablet Take 1 mg by mouth daily.    Yes Historical Provider, MD  gabapentin (NEURONTIN) 300 MG capsule Take 300 mg by mouth 3 (three) times daily.    Yes Historical Provider, MD  insulin glargine (LANTUS) 100 UNIT/ML injection Inject 50-60 Units into the skin daily. Depends on CBG   Yes Historical Provider, MD  l-methylfolate-B6-B12 (METANX) 3-35-2 MG TABS Take 1 tablet by mouth 2 (two) times daily.   Yes Historical Provider, MD  Multiple Vitamins-Minerals (MULTIVITAMINS THER. W/MINERALS) TABS Take 1 tablet by mouth daily.    Yes Historical Provider, MD  nitroGLYCERIN (NITROSTAT) 0.4 MG SL tablet Place 1 tablet  (0.4 mg total) under the tongue every 5 (five) minutes x 3 doses as needed for chest pain (if needed for severe chest pain/pressure). 08/29/11 08/28/12 Yes Abelino Derrick, PA  oxyCODONE (OXY IR/ROXICODONE) 5 MG immediate release tablet Take 5 mg by mouth every 6 (six) hours as needed. For pain 09/16/11  Yes Historical Provider, MD  Pancrelipase, Lip-Prot-Amyl, (ZENPEP) 25000 UNITS CPEP Take 1 capsule by mouth 3 (three) times daily before meals.   Yes Historical Provider, MD  phenytoin (DILANTIN) 100 MG ER capsule Take 100-300 mg by mouth 3 (three) times daily. Take 2 capsule in the morning, 1 capsule at noon, and 3 capsules at bedtime   Yes Historical Provider, MD  pioglitazone (ACTOS) 30 MG tablet Take 30 mg by mouth daily.    Yes Historical Provider, MD  vitamin B-12 (CYANOCOBALAMIN) 1000 MCG tablet Take 1,000 mcg by mouth daily.     Yes Historical Provider, MD    Current Facility-Administered Medications  Medication Dose Route Frequency Provider Last Rate Last Dose  . 0.9 %  sodium chloride infusion   Intravenous Continuous Cristal Ford, MD      . acetaminophen (TYLENOL) tablet 650 mg  650 mg Oral Q6H PRN Cristal Ford, MD       Or  . acetaminophen (TYLENOL) suppository 650 mg  650 mg Rectal Q6H PRN Cristal Ford, MD      . aspirin chewable tablet 324 mg  324 mg Oral Once Peter A. Patrica Duel, MD   324 mg at 11/11/11 0842  . aspirin EC tablet 325 mg  325 mg Oral Daily Cristal Ford, MD      . DULoxetine (CYMBALTA) DR capsule 20 mg  20 mg Oral BID Cristal Ford, MD      . enoxaparin (LOVENOX) injection 40 mg  40 mg Subcutaneous Q24H Cristal Ford, MD      . folic acid (FOLVITE) tablet 1 mg  1 mg Oral Daily Srikar Cherlynn Kaiser, MD      . gabapentin (NEURONTIN) capsule 300 mg  300 mg Oral TID Cristal Ford, MD      . HYDROmorphone (DILAUDID) injection 1 mg  1 mg Intravenous Once Peter A. Patrica Duel, MD   1 mg at 11/11/11 0842  . HYDROmorphone (DILAUDID) injection 1 mg  1 mg Intravenous Once Peter A. Patrica Duel,  MD   1 mg at 11/11/11 1015  . HYDROmorphone (DILAUDID) injection 1 mg  1 mg Intravenous Once Peter A. Patrica Duel, MD   1 mg at 11/11/11 1234  . HYDROmorphone (DILAUDID) injection 1 mg  1 mg Intravenous Q4H PRN Cristal Ford, MD      . insulin aspart (novoLOG) injection 0-15 Units  0-15 Units Subcutaneous TID WC Srikar Cherlynn Kaiser,  MD      . insulin glargine (LANTUS) injection 50-60 Units  50-60 Units Subcutaneous Daily Cristal Ford, MD      . iohexol (OMNIPAQUE) 300 MG/ML solution 20 mL  20 mL Oral Q1 Hr x 2 Medication Radiologist, MD   20 mL at 11/11/11 1425  . iohexol (OMNIPAQUE) 300 MG/ML solution 80 mL  80 mL Intravenous Once PRN Medication Radiologist, MD   80 mL at 11/11/11 1548  . mulitivitamin with minerals tablet 1 tablet  1 tablet Oral Daily Cristal Ford, MD      . nicotine (NICODERM CQ - dosed in mg/24 hours) patch 14 mg  14 mg Transdermal Daily Cristal Ford, MD      . nitroGLYCERIN (NITROSTAT) SL tablet 0.4 mg  0.4 mg Sublingual Q5 Min x 3 PRN Cristal Ford, MD      . ondansetron (ZOFRAN) injection 4 mg  4 mg Intravenous Once Peter A. Patrica Duel, MD   4 mg at 11/11/11 0842  . ondansetron (ZOFRAN) tablet 4 mg  4 mg Oral Q6H PRN Cristal Ford, MD       Or  . ondansetron (ZOFRAN) injection 4 mg  4 mg Intravenous Q6H PRN Cristal Ford, MD      . oxyCODONE (Oxy IR/ROXICODONE) immediate release tablet 5 mg  5 mg Oral Q4H PRN Cristal Ford, MD      . phenytoin (DILANTIN) ER capsule 100 mg  100 mg Oral Q1200 Cristal Ford, MD      . phenytoin (DILANTIN) ER capsule 200 mg  200 mg Oral Q0600 Cristal Ford, MD      . phenytoin (DILANTIN) ER capsule 300 mg  300 mg Oral QHS Cristal Ford, MD      . vitamin B-12 (CYANOCOBALAMIN) tablet 1,000 mcg  1,000 mcg Oral Daily Cristal Ford, MD      . Vitamin D (Ergocalciferol) (DRISDOL) capsule 50,000 Units  50,000 Units Oral Q7 days Cristal Ford, MD      . DISCONTD: ergocalciferol (VITAMIN D2) capsule 50,000 Units  50,000 Units Oral Weekly Cristal Ford, MD      . DISCONTD: multivitamins ther. w/minerals tablet 1 tablet  1 tablet Oral Daily Cristal Ford, MD      . DISCONTD: phenytoin (DILANTIN) ER capsule 100-300 mg  100-300 mg Oral TID Cristal Ford, MD       Facility-Administered Medications Ordered in Other Encounters  Medication Dose Route Frequency Provider Last Rate Last Dose  . diazepam (VALIUM) tablet 5 mg  5 mg Oral Once Nada Boozer, NP      . HYDROmorphone (DILAUDID) injection 0.5 mg  0.5 mg Intravenous Once Nada Boozer, NP      . HYDROmorphone (DILAUDID) injection 1 mg  1 mg Intravenous Once Nada Boozer, NP      . HYDROmorphone (DILAUDID) injection 1 mg  1 mg Intravenous Once Nada Boozer, NP      . promethazine (PHENERGAN) injection 12.5 mg  12.5 mg Intravenous Once Nada Boozer, NP        Allergies as of 11/11/2011  . (No Known Allergies)    Family History  Problem Relation Age of Onset  . Diabetes Father   . Coronary artery disease Mother     History   Social History  . Marital Status: Married    Spouse Name: N/A    Number of Children: N/A  . Years of Education: N/A   Occupational History  .  Not on file.   Social History Main Topics  . Smoking status: Current Everyday Smoker -- 1.0 packs/day for 50 years    Types: Cigarettes  . Smokeless tobacco: Never Used  . Alcohol Use: 14.4 oz/week    24 Cans of beer per week  . Drug Use: No  . Sexually Active:    Other Topics Concern  . Not on file   Social History Narrative  . No narrative on file    Review of Systems: Positive for occasional chest spasms suggestive of esophageal spasm which are random in occurrence. Rare heartburn, which is diet related. He felt like he had heartburn during last night's episode, but it did not respond to antacids. No significant weight loss. No significant lower GI tract symptoms. He might see occasional blood with his bowel movements. It is noted that he had a negative colonoscopy in 2004 apart from a hyperplastic  rectal polyp. Physical Exam: Vital signs in last 24 hours: Temp:  [98.2 F (36.8 C)] 98.2 F (36.8 C) (03/20 1555) Pulse Rate:  [83-115] 95  (03/20 1555) Resp:  [14-22] 22  (03/20 1555) BP: (143-150)/(50-87) 145/81 mmHg (03/20 1555) SpO2:  [99 %-100 %] 100 % (03/20 1555) Weight:  [68.3 kg (150 lb 9.2 oz)] 68.3 kg (150 lb 9.2 oz) (03/20 1555)   General:   Alert,  Well-developed, well-nourished, pleasant and cooperative in NAD Head:  Normocephalic and atraumatic. Eyes:  Sclera clear, no icterus.    Mouth:   No ulcerations or lesions.  Oropharynx pink & moist. Lungs:  Clear throughout to auscultation.   No wheezes, crackles, or rhonchi. No evident respiratory distress. Heart:   Regular rate and rhythm; no murmurs, clicks, rubs,  or gallops. Abdomen: The abdomen is mildly distended and diffusely tympanitic, without discernible organomegaly or any significant tenderness Msk:   Symmetrical without gross deformities. Extremities:   Without clubbing, cyanosis, or edema. Neurologic:  Alert and coherent;  grossly normal neurologically. Skin:  Intact without significant lesions or rashes. Psych:   Alert and cooperative. Normal mood and affect.  Intake/Output from previous day:   Intake/Output this shift:    Lab Results:  Basename 11/11/11 0838 11/11/11 0828  WBC -- 13.6*  HGB 17.7* 16.8  HCT 52.0 47.6  PLT -- 246   BMET  Basename 11/11/11 0838 11/11/11 0828  NA 133* 133*  K 4.2 4.3  CL 97 91*  CO2 -- 21  GLUCOSE 353* 338*  BUN 5* 7  CREATININE 0.60 0.34*  CALCIUM -- 9.9   LFT  Basename 11/11/11 0828  PROT 7.3  ALBUMIN 3.8  AST 14  ALT 10  ALKPHOS 66  BILITOT 0.3  BILIDIR --  IBILI --   PT/INR No results found for this basename: LABPROT:2,INR:2 in the last 72 hours  Studies/Results: US Abdomen Complete  11/11/2011  *RADIOLOGY REPORT*  Clinical Data:  Abdominal pain  COMPLETE ABDOMINAL ULTRASOUND  Comparison:  CT abdomen pelvis of 05/01/2010  Findings:   Gallbladder:  Small nonshadowing gallstones are present within the gallbladder.  There is no pain of the gallbladder with compression.  Common bile duct:  The common bile duct is prominent measuring 10.9 mm in diameter although no definite calculus is evident.  Liver:  The liver has a normal echogenic pattern.  No ductal dilatation is seen.  IVC:  Appears normal.  Pancreas:  There is a complex cystic structure in the neck - proximal body of the pancreas measuring 5.6 x 4.1 x 5.6 cm consistent with  pancreatic pseudocyst with calcifications.  The more distal pancreas is difficult to evaluate due to bowel gas.  Spleen:  The spleen is normal measuring 5.6 cm sagittally.  Right Kidney:  No hydronephrosis is seen.  The right kidney measures 12.4 cm sagittally.  Left Kidney:  No hydronephrosis is noted.  The left kidney measures 10.6 cm.  A small cyst is present in the lower pole of 0.9 mm in diameter.  Abdominal aorta:  The aorta is partially obscured by bowel gas.  No aneurysm is evident.  IMPRESSION:  1.  Probable pancreatic pseudocyst in the neck - proximal body of 5.6 cm. 2.  Small nonshadowing gallstones.  No pain over the gallbladder. 3.  Prominent common bile duct.  No definite common bile duct calculus.  Correlate with LFTs.  Original Report Authenticated By: Juline Patch, M.D.   Ct Abdomen Pelvis W Contrast  11/11/2011  *RADIOLOGY REPORT*  Clinical Data: Right chest pain.  Evaluate for pancreatitis.  CT ABDOMEN AND PELVIS WITH CONTRAST  Technique:  Multidetector CT imaging of the abdomen and pelvis was performed following the standard protocol during bolus administration of intravenous contrast.  Contrast: 80mL OMNIPAQUE IOHEXOL 300 MG/ML IJ SOLN  Comparison: Ultrasound abdomen 11/11/2011 and MR abdomen 05/02/2010.  CT abdomen pelvis 05/01/2010.  Findings: Lung bases show no acute findings.  Heart size normal. No pericardial or pleural effusion.  There may be mild thickening of the distal esophageal wall,  which can be seen with gastroesophageal reflux disease.  A 10 mm hyperattenuating lesion is seen in the right hepatic lobe, stable from prior exams.  Liver and gallbladder are otherwise unremarkable.  Mild nodular thickening of the adrenal glands, stable.  Right kidney is unremarkable.  Sub centimeter low attenuation lesions in the left kidney are unchanged.  Spleen is unremarkable.  Calcifications are seen in the pancreas with mild pancreatic ductal prominence.  A low attenuation lesion in the pancreatic neck measures 3.5 x 3.8 cm (previously 3.2 x 2.9 cm).  No peripancreatic inflammation.  Stomach and bowel are unremarkable.  There is a complex lesion in the left groin, which is predominantly low in attenuation, measuring 1.8 x 2.6 cm, new from the 05/01/2010.  No pathologically enlarged intra-abdominal or intrapelvic lymph nodes.  Atherosclerotic calcification of the arterial vasculature without abdominal aortic aneurysm.  No free fluid.  No worrisome lytic or sclerotic lesions. Sclerosis is seen in the femoral heads, left greater than right.  IMPRESSION:  1.  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  No evidence of acute pancreatitis. 2. New complex nodular lesion in the left groin.  Question recent surgery.  A necrotic lymph node cannot be definitively excluded. 3.  Stable hyperattenuating lesion in the right hepatic lobe, previously suggested to represent focal nodular hyperplasia. 4.  Avascular necrosis of the femoral heads, left greater than right.  Original Report Authenticated By: Reyes Ivan, M.D.   Dg Chest Port 1 View  11/11/2011  *RADIOLOGY REPORT*  Clinical Data: Right-sided chest pain, history of smoking, diabetes, seizures  PORTABLE CHEST - 1 VIEW  Comparison: Chest x-ray of 06/23/2011  Findings: The lungs are clear and hyperaerated.  Mediastinal contours appear normal.  The heart is within upper limits of normal.  No bony abnormality  is seen.  IMPRESSION: No active lung disease.  No change in slight hyperaeration.  Original Report Authenticated By: Juline Patch, M.D.    Impression: 1. Right upper quadrant abdominal pain 2. Multiple  gallstones on ultrasound, non-shadowing 3. Dilated CBD (11 mm), normal LFTs 4. Enlarging pancreatic cystic lesion, presumed pseudocyst 5. History of chronic pancreatitis, pancreatic ductal stone; mild current elevation of lipase level 6. History of minimal hematochezia, no about 9 years status post negative colonoscopy  Plan: Overall, this sounds to me like it was a gallstone attack, and I think the patient should be considered for a cholecystectomy. However, before proceeding to that, I do feel an updated CT scan would be appropriate, and I would also repeat labs and obtain a CA 19-9 level. If the patient is felt not to be a surgical candidate, I would consider chronic use of ursodeoxycholic acid to potentially prevent further gallstone problems in the future.   LOS: 0 days   Leiyah Maultsby V  11/11/2011, 4:33 PM

## 2011-11-11 NOTE — ED Notes (Signed)
Pt reports "couple day" hx of right sided chest pain, reports constant, that has gotten worse, reports severe nausea, states multiple episodes of vomiting.

## 2011-11-11 NOTE — ED Notes (Signed)
Irving Burton to spreak to admitting MD regarding questions concerning pt appropriateness of tele bed

## 2011-11-11 NOTE — ED Provider Notes (Signed)
History     CSN: [id number]  Arrival date & time 11/11/11  0803   None     Chief Complaint  Patient presents with  . Chest Pain   patient with very complex past medical history including diabetes, peripheral vascular disease, GERD, stomach ulcers, hypertension, anxiety. Also, apparently has a history of gastritis, DTs, fatty liver. His wife, states she's been having chest pain, off and on for the past one to 2 weeks. The pain became worse this morning. He did vomit this morning as well. Patient also has been burping and has pain in his right upper quadrant area. Nurses state that he "passed out" in triage, the pain is a partially 7/10, more on the right side of his chest and epigastric area. He's had nausea and denies any back pain. Patient denies any numbness or tingling. There's been no radiation to the neck or arms. He denies aches, exertional component. Patient states he took Alka-Seltzer for the symptoms overnight without relief.  (Consider location/radiation/quality/duration/timing/severity/associated sxs/prior treatment) HPI  Past Medical History  Diagnosis Date  . Peripheral vascular disease   . Diabetes mellitus   . GERD (gastroesophageal reflux disease)   . Seizures   . Stomach ulcer   . Hypertension   . Anxiety   . Depression   . Hepatitis   . H/O hiatal hernia   . Anemia   . Blood transfusion   . Undescended testicle   . Pneumonia DEC 2003  . Colon polyps   . Chronic lower back pain   . Subdural hematoma JULY 2006  . Duodenitis   . Esophagitis   . Coarse tremors   . Diverticulosis   . Rib fracture DEC 2005    Right sided from a fall  . Rectal bleed JULY 2004  . Vitamin d deficiency   . Macrocytosis   . DTs (delirium tremens)   . Claudication in peripheral vascular disease   . Pancreatitis, alcoholic   . Fatty liver, alcoholic   . Hemorrhoids   . Pseudoaneurysm of femoral artery, Lt. with closure /Thrombin injection. 09/03/2011    Past Surgical History    Procedure Date  . Vascular surgery   . Ligament repair 1955    left leg   . False aneurysm repair 09/16/2011    Procedure: REPAIR FALSE ANEURYSM;  Surgeon: Larina Earthly, MD;  Location: [facility] OR;  Service: Vascular;  Laterality: Left;    Family History  Problem Relation Age of Onset  . Diabetes Father     History  Substance Use Topics  . Smoking status: Current Everyday Smoker -- 1.0 packs/day for 50 years    Types: Cigarettes  . Smokeless tobacco: Never Used  . Alcohol Use: 14.4 oz/week    24 Cans of beer per week      Review of Systems  All other systems reviewed and are negative.    Allergies  Review of patient's allergies indicates no known allergies.  Home Medications   Current Outpatient Rx  Name Route Sig Dispense Refill  . CLOPIDOGREL BISULFATE 75 MG PO TABS Oral Take 1 tablet (75 mg total) by mouth daily with breakfast. 30 tablet 5  . DULOXETINE HCL 20 MG PO CPEP Oral Take 20 mg by mouth 2 (two) times daily.     . ERGOCALCIFEROL 50000 UNITS PO CAPS Oral Take 50,000 Units by mouth once a week. SUNDAY    . FOLIC ACID 1 MG PO TABS Oral Take 1 mg by mouth daily.     Marland Kitchen  GABAPENTIN 300 MG PO CAPS Oral Take 300 mg by mouth 3 (three) times daily.     . INSULIN GLARGINE 100 UNIT/ML [place] SOLN Subcutaneous Inject 50-60 Units into the skin 2 (two) times daily. 50 units in the morning; 60 units at bedtime    . THERA M PLUS PO TABS Oral Take 1 tablet by mouth daily.     Marland Kitchen NITROGLYCERIN 0.4 MG SL SUBL Sublingual Place 1 tablet (0.4 mg total) under the tongue every 5 (five) minutes x 3 doses as needed for chest pain (if needed for severe chest pain/pressure). 25 tablet 2  . PANCRELIPASE (LIP-PROT-AMYL) 25000 UNITS PO CPEP Oral Take 1 capsule by mouth 3 (three) times daily between meals as needed.     Marland Kitchen PHENYTOIN SODIUM EXTENDED 100 MG PO CAPS Oral Take 200 mg by mouth 3 (three) times daily. 2 capsules in the morning  and 2 capsules at bedtime    . PIOGLITAZONE HCL 30 MG PO TABS Oral  Take 30 mg by mouth daily.     Marland Kitchen VITAMIN B-12 1000 MCG PO TABS Oral Take 1,000 mcg by mouth daily.        There were no vitals taken for this visit.  Physical Exam  Nursing note and vitals reviewed. Constitutional: He is oriented to person, place, and time. He appears well-developed and well-nourished. No distress.  HENT:  Head: Normocephalic and atraumatic.  Eyes: Conjunctivae and EOM are normal. Pupils are equal, round, and reactive to light.  Neck: Neck supple.  Cardiovascular: Normal rate and regular rhythm.  Exam reveals no gallop and no friction rub.   No murmur heard. Pulmonary/Chest: Breath sounds normal. He has no wheezes. He has no rales. He exhibits no tenderness.       Mild sternal tenderness.   Abdominal: Soft. Bowel sounds are normal. He exhibits no distension. There is tenderness. There is no rebound and no guarding.       Epigastric and RUQ tenderness to palpation.   Musculoskeletal: Normal range of motion. He exhibits no edema and no tenderness.  Neurological: He is alert and oriented to person, place, and time. No cranial nerve deficit. Coordination normal.  Skin: Skin is warm and dry. No rash noted.  Psychiatric: He has a normal mood and affect.    ED Course  Procedures (including critical care time)   Labs Reviewed  CBC  COMPREHENSIVE METABOLIC PANEL  LIPASE, BLOOD   No results found.   No diagnosis found.    MDM  Patient is seen and examined, initial history and physical is completed. Evaluation initiated  IV, o2, monitor, stat labs, trop, CXR.  Will also include LFT's , lipase and  may need [place].  Will cycle ECG and follow closely.     Date: 11/11/2011  Rate: 118  Rhythm: sinus tachycardia  QRS Axis: right  Intervals: normal  ST/T Wave abnormalities: nonspecific ST/T changes  Conduction Disutrbances:none  Narrative Interpretation:   Old EKG Reviewed: changes noted    Date: 11/11/2011  Rate: 103  Rhythm: sinus tachycardia  QRS Axis:  Right  Intervals: normal  ST/T Wave abnormalities: nonspecific ST/T changes  Conduction Disutrbances:none  Narrative Interpretation:   Old EKG Reviewed: changes noted   Results for orders placed during the hospital encounter of 11/11/11  CBC      Component Value Range   WBC 13.6 (*) 4.0 - 10.5 (K/uL)   RBC 4.79  4.22 - 5.81 (MIL/uL)   Hemoglobin 16.8  13.0 - 17.0 (g/dL)  HCT 47.6  39.0 - 52.0 (%)   MCV 99.4  78.0 - 100.0 (fL)   MCH 35.1 (*) 26.0 - 34.0 (pg)   MCHC 35.3  30.0 - 36.0 (g/dL)   RDW 16.1  09.6 - 04.5 (%)   Platelets 246  150 - 400 (K/uL)  POCT I-STAT, CHEM 8      Component Value Range   Sodium 133 (*) 135 - 145 (mEq/L)   Potassium 4.2  3.5 - 5.1 (mEq/L)   Chloride 97  96 - 112 (mEq/L)   BUN 5 (*) 6 - 23 (mg/dL)   Creatinine, Ser 4.09  0.50 - 1.35 (mg/dL)   Glucose, Bld 811 (*) 70 - 99 (mg/dL)   Calcium, Ion 9.14  7.82 - 1.32 (mmol/L)   TCO2 22  0 - 100 (mmol/L)   Hemoglobin 17.7 (*) 13.0 - 17.0 (g/dL)   HCT 95.6  21.3 - 08.6 (%)  POCT I-STAT TROPONIN I      Component Value Range   Troponin i, poc 0.00  0.00 - 0.08 (ng/mL)   Comment 3            Dg Chest Port 1 View  11/11/2011  *RADIOLOGY REPORT*  Clinical Data: Right-sided chest pain, history of smoking, diabetes, seizures  PORTABLE CHEST - 1 VIEW  Comparison: Chest x-ray of 06/23/2011  Findings: The lungs are clear and hyperaerated.  Mediastinal contours appear normal.  The heart is within upper limits of normal.  No bony abnormality is seen.  IMPRESSION: No active lung disease.  No change in slight hyperaeration.  Original Report Authenticated By: Juline Patch, M.D.       Initial Trop nl.         Harriet Bollen A. Patrica Duel, MD 11/12/11 1106

## 2011-11-11 NOTE — ED Notes (Signed)
Called report to [facility]

## 2011-11-11 NOTE — ED Notes (Signed)
Pt with syncopal episode after completing EKG, pt with pinpoint pupils and unable to answer any questions initially after syncopal episode. Wife reports hx of seizures. Pt moved immediately to room and EDP at bedside, pt placed on monitor and vitals obtained.

## 2011-11-11 NOTE — ED Notes (Signed)
Pt co rt side chest pain that is constant and sharp.  Pt stats the pain has been increasing for 3 days and radiates into neck on right side only. Pain is decreasd by pressure on the ribcage.  Pt states he does have SOB with activity Pt alert and oriented X4.  MD request repeat EKG at 9am.  Pt has no cardiac history.

## 2011-11-11 NOTE — Progress Notes (Signed)
I saw Mitchell Mccall as we were asked by the ER to see him for "chest pain". He is describing right upper quadrant abdominal pain, not chest pain. There is no evidence for cardiac chest pain. No acute ischemic EKG changes. Troponin is negative. I suspect, as Dr. Matthias Hughs does, this is related to gallstones. No further work-up is required.  Chrystie Nose, MD, [facility] Attending Cardiologist [facility]

## 2011-11-12 ENCOUNTER — Encounter (HOSPITAL_COMMUNITY): Payer: Self-pay | Admitting: General Surgery

## 2011-11-12 DIAGNOSIS — K8020 Calculus of gallbladder without cholecystitis without obstruction: Secondary | ICD-10-CM

## 2011-11-12 LAB — COMPREHENSIVE METABOLIC PANEL
ALT: 9 U/L (ref 0–53)
AST: 28 U/L (ref 0–37)
CO2: 23 mEq/L (ref 19–32)
Chloride: 101 mEq/L (ref 96–112)
Creatinine, Ser: 0.34 mg/dL — ABNORMAL LOW (ref 0.50–1.35)
GFR calc non Af Amer: >90 mL/min (ref >90)
Total Bilirubin: 0.3 mg/dL (ref 0.3–1.2)

## 2011-11-12 LAB — CBC
MCH: 35.2 pg — ABNORMAL HIGH (ref 26.0–34.0)
MCHC: 36 g/dL (ref 30.0–36.0)
Platelets: 235 10*3/uL (ref 150–400)

## 2011-11-12 LAB — CARDIAC PANEL(CRET KIN+CKTOT+MB+TROPI)
CK, MB: 1.8 ng/mL (ref 0.3–4.0)
Relative Index: INVALID (ref 0.0–2.5)

## 2011-11-12 LAB — LIPASE, BLOOD: Lipase: 33 U/L (ref 11–59)

## 2011-11-12 LAB — CANCER ANTIGEN 19-9: CA 19-9: 25.1 U/mL — ABNORMAL LOW (ref <35.0)

## 2011-11-12 MED ORDER — PANTOPRAZOLE SODIUM 40 MG PO TBEC
40.0000 mg | DELAYED_RELEASE_TABLET | Freq: Two times a day (BID) | ORAL | Status: DC
  Administered 2011-11-12: 40 mg via ORAL

## 2011-11-12 MED ORDER — URSODIOL 300 MG PO CAPS
300.0000 mg | ORAL_CAPSULE | Freq: Two times a day (BID) | ORAL | Status: DC
  Administered 2011-11-12 – 2011-11-13 (×3): 300 mg via ORAL
  Filled 2011-11-12 (×4): qty 1

## 2011-11-12 NOTE — Consult Note (Signed)
I have seen and examined the patient and agree with the assessment and plans.  Will need follow up of pseudocyst prior to lap chole. Hopefully, it will resolve without need for intervention  Winton Offord A. Magnus Ivan  MD, FACS

## 2011-11-12 NOTE — Discharge Instructions (Signed)
Maintain a low-fat diet

## 2011-11-12 NOTE — Progress Notes (Signed)
Utilization Review Completed.Dewanna Hurston T3/21/2013   

## 2011-11-12 NOTE — Progress Notes (Signed)
GASTROENTEROLOGY PROGRESS NOTE  Problem:   Abdominal pain. Cholelithiasis. Chronic pancreatitis with pancreatic pseudocyst.  Subjective: Pain-free. Tolerated solid diet. Feels ready to go home.  Objective: Afebrile. No acute distress. Abdomen nontender. White count normal. Liver chemistries normal. Lipase has normalized overnight. CA 19-9 also came back normal.  CT scan showed slight enlargement of the pseudocyst, changes of chronic pancreatitis, no acute changes, and no evidence of acute pancreatitis.  Assessment: 1. Abdominal pain, resolved. I have reviewed the surgeon's note and it appears we are in agreement that this was probably symptomatic cholelithiasis that brought the patient to the hospital. However, if this was an attack of biliary colic, it has completely resolved. 2. Cholelithiasis, new-onset 3. Chronic pancreatitis, without evidence of acute pancreatitis  Plan: Okay for discharge at any time from the GI tract standpoint. In the meantime, I will start the patient on ursodeoxycholic acid 300 mg twice daily. Rationale behind this discussed with patient.  I have also made him a followup appointment with Dr. Odelia Gage in our office, to discuss the possibility of doing a repeat EUS, as was done about 2 years ago, with the thought of possible drainage of the pseudocyst. The appointment has been set up for Monday, April 22, at 2 PM, and the patient was made aware of this appointment.  Florencia Reasons, M.D. 11/12/2011 3:13 PM

## 2011-11-12 NOTE — Progress Notes (Signed)
Please see my note from earlier today.  I have started the patient on ursodiol, 300 mg twice daily, in lieu of surgical management for the time being. I would recommend that he be discharged on this medication, and continued on it indefinitely unless or until he can have his gallbladder removed. This medication may dissolve his gallstones, and may also reduce the risk of recurrent attacks of biliary colic.  Florencia Reasons, M.D. [phone]

## 2011-11-12 NOTE — Progress Notes (Signed)
Subjective: Abdominal pain improved.  Wondering if his diet can be advanced.  Feeling significantly improved.  No specific complaints.  No events noted on telemetry.  Objective: Vital signs in last 24 hours: Filed Vitals:   11/11/11 1555 11/11/11 2100 11/12/11 0500 11/12/11 0757  BP: 145/81 148/77 139/81 114/61  Pulse: 95 90 98 89  Temp: 98.2 F (36.8 C) 98.7 F (37.1 C) 97.9 F (36.6 C) 97.9 F (36.6 C)  TempSrc: Oral Oral Oral Oral  Resp: 22 20 18 18   Height: 5\' 9"  (1.753 m)     Weight: 68.3 kg (150 lb 9.2 oz)  68.312 kg (150 lb 9.6 oz)   SpO2: 100% 99% 96% 100%   Weight change:   Intake/Output Summary (Last 24 hours) at 11/12/11 0958 Last data filed at 11/11/11 1908  Gross per 24 hour  Intake 248.33 ml  Output      0 ml  Net 248.33 ml    Physical Exam: General: Awake, Oriented, No acute distress. HEENT: EOMI. Neck: Supple CV: S1 and S2 Lungs: Clear to ascultation bilaterally Abdomen: Soft, Nontender, Nondistended, +bowel sounds. Ext: Good pulses. Trace edema.  Lab Results:  Basename 11/12/11 0251 11/11/11 0838 11/11/11 0828  NA 135 133* --  K 4.2 4.2 --  CL 101 97 --  CO2 23 -- 21  GLUCOSE 146* 353* --  BUN 5* 5* --  CREATININE 0.34* 0.60 --  CALCIUM 8.7 -- 9.9  MG -- -- --  PHOS -- -- --    Basename 11/12/11 0251 11/11/11 0828  AST 28 14  ALT 9 10  ALKPHOS 40 66  BILITOT 0.3 0.3  PROT 6.0 7.3  ALBUMIN 2.9* 3.8    Basename 11/12/11 0251 11/11/11 0828  LIPASE 33 81*  AMYLASE -- --    Basename 11/12/11 0251 11/11/11 0838 11/11/11 0828  WBC 9.3 -- 13.6*  NEUTROABS -- -- --  HGB 14.0 17.7* --  HCT 38.9* 52.0 --  MCV 97.7 -- 99.4  PLT 235 -- 246    Basename 11/12/11 0251 11/11/11 2112 11/11/11 1500  CKTOTAL 32 31 24  CKMB 1.8 1.9 1.8  CKMBINDEX -- -- --  TROPONINI <0.30 <0.30 <0.30   No components found with this basename: POCBNP:3 No results found for this basename: DDIMER:2 in the last 72 hours No results found for this basename:  HGBA1C:2 in the last 72 hours No results found for this basename: CHOL:2,HDL:2,LDLCALC:2,TRIG:2,CHOLHDL:2,LDLDIRECT:2 in the last 72 hours No results found for this basename: TSH,T4TOTAL,FREET3,T3FREE,THYROIDAB in the last 72 hours No results found for this basename: VITAMINB12:2,FOLATE:2,FERRITIN:2,TIBC:2,IRON:2,RETICCTPCT:2 in the last 72 hours  Micro Results: No results found for this or any previous visit (from the past 240 hour(s)).  Studies/Results: US Abdomen Complete  11/11/2011  *RADIOLOGY REPORT*  Clinical Data:  Abdominal pain  COMPLETE ABDOMINAL ULTRASOUND  Comparison:  CT abdomen pelvis of 05/01/2010  Findings:  Gallbladder:  Small nonshadowing gallstones are present within the gallbladder.  There is no pain of the gallbladder with compression.  Common bile duct:  The common bile duct is prominent measuring 10.9 mm in diameter although no definite calculus is evident.  Liver:  The liver has a normal echogenic pattern.  No ductal dilatation is seen.  IVC:  Appears normal.  Pancreas:  There is a complex cystic structure in the neck - proximal body of the pancreas measuring 5.6 x 4.1 x 5.6 cm consistent with pancreatic pseudocyst with calcifications.  The more distal pancreas is difficult to evaluate due to bowel gas.  Spleen:  The spleen is normal measuring 5.6 cm sagittally.  Right Kidney:  No hydronephrosis is seen.  The right kidney measures 12.4 cm sagittally.  Left Kidney:  No hydronephrosis is noted.  The left kidney measures 10.6 cm.  A small cyst is present in the lower pole of 0.9 mm in diameter.  Abdominal aorta:  The aorta is partially obscured by bowel gas.  No aneurysm is evident.  IMPRESSION:  1.  Probable pancreatic pseudocyst in the neck - proximal body of 5.6 cm. 2.  Small nonshadowing gallstones.  No pain over the gallbladder. 3.  Prominent common bile duct.  No definite common bile duct calculus.  Correlate with LFTs.  Original Report Authenticated By: Juline Patch, M.D.    Ct Abdomen Pelvis W Contrast  11/11/2011  *RADIOLOGY REPORT*  Clinical Data: Right chest pain.  Evaluate for pancreatitis.  CT ABDOMEN AND PELVIS WITH CONTRAST  Technique:  Multidetector CT imaging of the abdomen and pelvis was performed following the standard protocol during bolus administration of intravenous contrast.  Contrast: 80mL OMNIPAQUE IOHEXOL 300 MG/ML IJ SOLN  Comparison: Ultrasound abdomen 11/11/2011 and MR abdomen 05/02/2010.  CT abdomen pelvis 05/01/2010.  Findings: Lung bases show no acute findings.  Heart size normal. No pericardial or pleural effusion.  There may be mild thickening of the distal esophageal wall, which can be seen with gastroesophageal reflux disease.  A 10 mm hyperattenuating lesion is seen in the right hepatic lobe, stable from prior exams.  Liver and gallbladder are otherwise unremarkable.  Mild nodular thickening of the adrenal glands, stable.  Right kidney is unremarkable.  Sub centimeter low attenuation lesions in the left kidney are unchanged.  Spleen is unremarkable.  Calcifications are seen in the pancreas with mild pancreatic ductal prominence.  A low attenuation lesion in the pancreatic neck measures 3.5 x 3.8 cm (previously 3.2 x 2.9 cm).  No peripancreatic inflammation.  Stomach and bowel are unremarkable.  There is a complex lesion in the left groin, which is predominantly low in attenuation, measuring 1.8 x 2.6 cm, new from the 05/01/2010.  No pathologically enlarged intra-abdominal or intrapelvic lymph nodes.  Atherosclerotic calcification of the arterial vasculature without abdominal aortic aneurysm.  No free fluid.  No worrisome lytic or sclerotic lesions. Sclerosis is seen in the femoral heads, left greater than right.  IMPRESSION:  1.  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  No evidence of acute pancreatitis. 2. New complex nodular lesion in the left groin.  Question recent surgery.  A necrotic lymph  node cannot be definitively excluded. 3.  Stable hyperattenuating lesion in the right hepatic lobe, previously suggested to represent focal nodular hyperplasia. 4.  Avascular necrosis of the femoral heads, left greater than right.  Original Report Authenticated By: Reyes Ivan, M.D.   Dg Chest Port 1 View  11/11/2011  *RADIOLOGY REPORT*  Clinical Data: Right-sided chest pain, history of smoking, diabetes, seizures  PORTABLE CHEST - 1 VIEW  Comparison: Chest x-ray of 06/23/2011  Findings: The lungs are clear and hyperaerated.  Mediastinal contours appear normal.  The heart is within upper limits of normal.  No bony abnormality is seen.  IMPRESSION: No active lung disease.  No change in slight hyperaeration.  Original Report Authenticated By: Juline Patch, M.D.    Medications: I have reviewed the patient's current medications. Scheduled Meds:   . aspirin EC  325 mg Oral Daily  . DULoxetine  20 mg Oral  BID  . enoxaparin  40 mg Subcutaneous Q24H  . folic acid  1 mg Oral Daily  . folic acid  1 mg Oral Daily  . gabapentin  300 mg Oral TID  .  HYDROmorphone (DILAUDID) injection  1 mg Intravenous Once  .  HYDROmorphone (DILAUDID) injection  1 mg Intravenous Once  . insulin aspart  0-15 Units Subcutaneous TID WC  . insulin glargine  25 Units Subcutaneous Once  . insulin glargine  50 Units Subcutaneous Daily  . iohexol  20 mL Oral Q1 Hr x 2  . LORazepam  0-4 mg Intravenous Q6H   Followed by  . LORazepam  0-4 mg Intravenous Q12H  . mulitivitamin with minerals  1 tablet Oral Daily  . nicotine  14 mg Transdermal Daily  . pantoprazole (PROTONIX) IV  40 mg Intravenous Q12H  . phenytoin  100 mg Oral Q1200  . phenytoin  200 mg Oral Q0600  . phenytoin  300 mg Oral QHS  . thiamine  100 mg Oral Daily   Or  . thiamine  100 mg Intravenous Daily  . vitamin B-12  1,000 mcg Oral Daily  . Vitamin D (Ergocalciferol)  50,000 Units Oral Q7 days  . DISCONTD: ergocalciferol  50,000 Units Oral Weekly  .  DISCONTD: insulin glargine  50-60 Units Subcutaneous Daily  . DISCONTD: mulitivitamin with minerals  1 tablet Oral Daily  . DISCONTD: multivitamins ther. w/minerals  1 tablet Oral Daily  . DISCONTD: phenytoin  100-300 mg Oral TID   Continuous Infusions:   . DISCONTD: sodium chloride 100 mL/hr at 11/11/11 1639   PRN Meds:.acetaminophen, acetaminophen, diphenhydrAMINE, iohexol, LORazepam, LORazepam, morphine injection, nitroGLYCERIN, ondansetron (ZOFRAN) IV, ondansetron, oxyCODONE, DISCONTD: HYDROmorphone  Assessment/Plan: 1. Epigastric pain/right upper quadrant pain. Lipase suggesting possible pancreatitis versus "gallstone attack."  Improved, advance diet as tolerated. Abdominal ultrasound showed pancreatic pseudocyst in the neck-proximal body measuring 5.6 cm, small non-shadowing gallstones noted, prominent common bile duct noted.  CT of the abdomen and pelvis showed chronic calcific pancreatitis with interval enlargement of a pseudocyst in the pancreatic neck.  No evidence of acute pancreatitis. New complex nodular lesion in the left groin. GI Dr. Matthias Hughs has evaluated the patient. CA 19-9 pending, ordered as per GI.  General surgery consult for consideration of cholecystectomy.  Discontinue telemetry, and no events noted .  Patient ruled out for acute coronary syndrome.  2. Pancreatic pseudocyst. Management conservatively as per GI.   3. History of peripheral vascular disease. Stable.   4. GERD. Continue PPI twice a day.   5. Hyponatremia.  Resolved.  Likely due to uncontrolled diabetes.   6. Uncontrolled type 2 diabetes with complications. Continue Lantus and moderate sliding scale insulin.  Better controlled.  7. History of anemia. Resolved.   8. History of alcohol abuse. Counseled the patient on alcohol cessation.  Continue CIWA protocol.  9. Tobacco abuse. Again counseled the patient on cessation. Nicotine patch ordered.   10. History of seizures. With questionable episode of  seizure while in the emergency department. Continue Dilantin. Continue frequent neuro checks.  No further episodes.  11. Hypertension. Stable.   11. Prophylaxis. Lovenox.   12. CODE STATUS. Full code.  13.  Disposition pending surgical evaluation.   LOS: 1 day  Kaiden Pech A, MD 11/12/2011, 9:58 AM

## 2011-11-12 NOTE — Consult Note (Signed)
Mitchell Mccall 1951-01-02  [id number].   Primary Care MD: Dr. Duane Lope Requesting MD: Dr. Betti Cruz Chief Complaint/Reason for Consult: gallstones HPI: This is a 61 yo male who had a history of ETOH pancreatitis with pseudocyst and chronic pancreatitis.  He has decreased his ETOH intake to 6 beers a week.  SEveral days ago he developed RUQ abdominal pain.  As the days progressed, this worsened.  He developed some nausea and dry heaves.  He finally called his wife yesterday to come bring him to the hospital secondary to pain.  He denied any pain in his LUQ c/w pancreatitis.  He was noted to have gallstones on ultrasound and a somewhat dilated duct.  He was also found to have a pancreatic pseudocyst that measured 5.6cm.  He was admitted and GI saw the patient along with medicine.  We were asked to see the patient for possible cholecystectomy.  Review of Systems: Please see HPI, otherwise all other systems are negative, except left hip pain since surgery in January.  Family History  Problem Relation Age of Onset  . Diabetes Father   . Coronary artery disease Mother     Past Medical History  Diagnosis Date  . Peripheral vascular disease   . Diabetes mellitus   . GERD (gastroesophageal reflux disease)   . Seizures   . Stomach ulcer   . Hypertension   . Anxiety   . Depression   . Hepatitis   . H/O hiatal hernia   . Anemia   . Blood transfusion   . Undescended testicle   . Pneumonia DEC 2003  . Colon polyps   . Chronic lower back pain   . Subdural hematoma JULY 2006  . Duodenitis   . Esophagitis   . Coarse tremors   . Diverticulosis   . Rib fracture DEC 2005    Right sided from a fall  . Rectal bleed JULY 2004  . Vitamin d deficiency   . Macrocytosis   . DTs (delirium tremens)   . Claudication in peripheral vascular disease   . Pancreatitis, alcoholic   . Fatty liver, alcoholic   . Hemorrhoids   . Pseudoaneurysm of femoral artery, Lt. with closure /Thrombin injection. 09/03/2011    . Pancreatic pseudocyst   . Blood dyscrasia     macrocytosis    Past Surgical History  Procedure Date  . Vascular surgery   . Ligament repair 1955    left leg   . False aneurysm repair 09/16/2011    Procedure: REPAIR FALSE ANEURYSM;  Surgeon: Larina Earthly, MD;  Location: [facility] OR;  Service: Vascular;  Laterality: Left;  . Incision and drainage of wound     Social History:  reports that he has been smoking Cigarettes.  He has a 50 pack-year smoking history. He has never used smokeless tobacco. He reports that he drinks alcohol. He reports that he does not use illicit drugs.  Allergies: No Known Allergies  Medications Prior to Admission  Medication Dose Route Frequency Provider Last Rate Last Dose  . acetaminophen (TYLENOL) tablet 650 mg  650 mg Oral Q6H PRN Cristal Ford, MD       Or  . acetaminophen (TYLENOL) suppository 650 mg  650 mg Rectal Q6H PRN Cristal Ford, MD      . aspirin chewable tablet 324 mg  324 mg Oral Once Peter A. Patrica Duel, MD   324 mg at 11/11/11 0842  . aspirin EC tablet 325 mg  325 mg Oral Daily  Cristal Ford, MD   325 mg at 11/12/11 1013  . diazepam (VALIUM) tablet 5 mg  5 mg Oral Once Nada Boozer, NP      . diphenhydrAMINE (BENADRYL) injection 25 mg  25 mg Intravenous Q6H PRN Cristal Ford, MD   25 mg at 11/11/11 1845  . DULoxetine (CYMBALTA) DR capsule 20 mg  20 mg Oral BID Cristal Ford, MD   20 mg at 11/12/11 1014  . enoxaparin (LOVENOX) injection 40 mg  40 mg Subcutaneous Q24H Cristal Ford, MD   40 mg at 11/11/11 1809  . folic acid (FOLVITE) tablet 1 mg  1 mg Oral Daily Cristal Ford, MD   1 mg at 11/12/11 1015  . folic acid (FOLVITE) tablet 1 mg  1 mg Oral Daily Cristal Ford, MD   1 mg at 11/11/11 2037  . gabapentin (NEURONTIN) capsule 300 mg  300 mg Oral TID Cristal Ford, MD   300 mg at 11/12/11 1016  . HYDROmorphone (DILAUDID) injection 0.5 mg  0.5 mg Intravenous Once Nada Boozer, NP      . HYDROmorphone (DILAUDID) injection 1 mg  1 mg  Intravenous Once Nada Boozer, NP      . HYDROmorphone (DILAUDID) injection 1 mg  1 mg Intravenous Once Nada Boozer, NP      . HYDROmorphone (DILAUDID) injection 1 mg  1 mg Intravenous Once Peter A. Patrica Duel, MD   1 mg at 11/11/11 0842  . HYDROmorphone (DILAUDID) injection 1 mg  1 mg Intravenous Once Peter A. Patrica Duel, MD   1 mg at 11/11/11 1015  . HYDROmorphone (DILAUDID) injection 1 mg  1 mg Intravenous Once Peter A. Patrica Duel, MD   1 mg at 11/11/11 1234  . insulin aspart (novoLOG) injection 0-15 Units  0-15 Units Subcutaneous TID WC Cristal Ford, MD   2 Units at 11/12/11 [phone]  . insulin glargine (LANTUS) injection 25 Units  25 Units Subcutaneous Once Cristal Ford, MD   25 Units at 11/11/11 1807  . insulin glargine (LANTUS) injection 50 Units  50 Units Subcutaneous Daily Cristal Ford, MD   50 Units at 11/12/11 1025  . iohexol (OMNIPAQUE) 300 MG/ML solution 20 mL  20 mL Oral Q1 Hr x 2 Medication Radiologist, MD   20 mL at 11/11/11 1425  . iohexol (OMNIPAQUE) 300 MG/ML solution 80 mL  80 mL Intravenous Once PRN Medication Radiologist, MD   80 mL at 11/11/11 1548  . LORazepam (ATIVAN) injection 0-4 mg  0-4 mg Intravenous Q6H Cristal Ford, MD       Followed by  . LORazepam (ATIVAN) injection 0-4 mg  0-4 mg Intravenous Q12H Cristal Ford, MD      . LORazepam (ATIVAN) tablet 1 mg  1 mg Oral Q6H PRN Cristal Ford, MD   1 mg at 11/11/11 2311   Or  . LORazepam (ATIVAN) injection 1 mg  1 mg Intravenous Q6H PRN Cristal Ford, MD      . morphine 2 MG/ML injection 1-2 mg  1-2 mg Intravenous Q4H PRN Cristal Ford, MD   2 mg at 11/11/11 2210  . mulitivitamin with minerals tablet 1 tablet  1 tablet Oral Daily Cristal Ford, MD   1 tablet at 11/12/11 1017  . nicotine (NICODERM CQ - dosed in mg/24 hours) patch 14 mg  14 mg Transdermal Daily Cristal Ford, MD   14 mg at 11/12/11 1016  . nitroGLYCERIN (NITROSTAT) SL tablet  0.4 mg  0.4 mg Sublingual Q5 Min x 3 PRN Cristal Ford, MD      . ondansetron  [facility]) injection 4 mg  4 mg Intravenous Once Peter A. Patrica Duel, MD   4 mg at 11/11/11 0842  . ondansetron (ZOFRAN) tablet 4 mg  4 mg Oral Q6H PRN Cristal Ford, MD       Or  . ondansetron (ZOFRAN) injection 4 mg  4 mg Intravenous Q6H PRN Cristal Ford, MD      . oxyCODONE (Oxy IR/ROXICODONE) immediate release tablet 5 mg  5 mg Oral Q4H PRN Cristal Ford, MD      . pantoprazole (PROTONIX) injection 40 mg  40 mg Intravenous Q12H Cristal Ford, MD   40 mg at 11/12/11 1026  . phenytoin (DILANTIN) ER capsule 100 mg  100 mg Oral Q1200 Cristal Ford, MD      . phenytoin (DILANTIN) ER capsule 200 mg  200 mg Oral Q0600 Cristal Ford, MD   200 mg at 11/12/11 0547  . phenytoin (DILANTIN) ER capsule 300 mg  300 mg Oral QHS Cristal Ford, MD   300 mg at 11/11/11 2247  . promethazine (PHENERGAN) injection 12.5 mg  12.5 mg Intravenous Once Nada Boozer, NP      . thiamine (VITAMIN B-1) tablet 100 mg  100 mg Oral Daily Cristal Ford, MD   100 mg at 11/12/11 1022   Or  . thiamine (B-1) injection 100 mg  100 mg Intravenous Daily Cristal Ford, MD      . vitamin B-12 (CYANOCOBALAMIN) tablet 1,000 mcg  1,000 mcg Oral Daily Cristal Ford, MD   1,000 mcg at 11/12/11 1014  . Vitamin D (Ergocalciferol) (DRISDOL) capsule 50,000 Units  50,000 Units Oral Q7 days Cristal Ford, MD      . DISCONTD: 0.9 %  sodium chloride infusion   Intravenous Continuous Cristal Ford, MD 100 mL/hr at 11/11/11 1639    . DISCONTD: ergocalciferol (VITAMIN D2) capsule 50,000 Units  50,000 Units Oral Weekly Cristal Ford, MD      . DISCONTD: HYDROmorphone (DILAUDID) injection 1 mg  1 mg Intravenous Q4H PRN Cristal Ford, MD   1 mg at 11/11/11 1640  . DISCONTD: insulin glargine (LANTUS) injection 50-60 Units  50-60 Units Subcutaneous Daily Cristal Ford, MD      . DISCONTD: mulitivitamin with minerals tablet 1 tablet  1 tablet Oral Daily Cristal Ford, MD      . DISCONTD: multivitamins ther. w/minerals tablet 1 tablet  1 tablet  Oral Daily Cristal Ford, MD      . DISCONTD: phenytoin (DILANTIN) ER capsule 100-300 mg  100-300 mg Oral TID Cristal Ford, MD       Medications Prior to Admission  Medication Sig Dispense Refill  . DULoxetine (CYMBALTA) 20 MG capsule Take 20 mg by mouth 2 (two) times daily.       . ergocalciferol (VITAMIN D2) 50000 UNITS capsule Take 50,000 Units by mouth once a week. SUNDAY      . folic acid (FOLVITE) 1 MG tablet Take 1 mg by mouth daily.       Marland Kitchen gabapentin (NEURONTIN) 300 MG capsule Take 300 mg by mouth 3 (three) times daily.       . insulin glargine (LANTUS) 100 UNIT/ML injection Inject 50-60 Units into the skin daily. Depends on CBG      . Multiple Vitamins-Minerals (MULTIVITAMINS THER. W/MINERALS) TABS  Take 1 tablet by mouth daily.       . nitroGLYCERIN (NITROSTAT) 0.4 MG SL tablet Place 1 tablet (0.4 mg total) under the tongue every 5 (five) minutes x 3 doses as needed for chest pain (if needed for severe chest pain/pressure).  25 tablet  2  . oxyCODONE (OXY IR/ROXICODONE) 5 MG immediate release tablet Take 5 mg by mouth every 6 (six) hours as needed. For pain      . phenytoin (DILANTIN) 100 MG ER capsule Take 100-300 mg by mouth 3 (three) times daily. Take 2 capsule in the morning, 1 capsule at noon, and 3 capsules at bedtime      . pioglitazone (ACTOS) 30 MG tablet Take 30 mg by mouth daily.       . vitamin B-12 (CYANOCOBALAMIN) 1000 MCG tablet Take 1,000 mcg by mouth daily.          Blood pressure 114/61, pulse 89, temperature 97.9 F (36.6 C), temperature source Oral, resp. rate 18, height 5\' 9"  (1.753 m), weight 150 lb 9.6 oz (68.312 kg), SpO2 100.00%. Physical Exam: General: pleasant, WD, WN white male who is laying in bed in NAD HEENT: head is normocephalic, atraumatic.  Sclera are noninjected.  PERRL.  Ears and nose without any masses or lesions.  Mouth is pink and moist Heart: regular, rate, and rhythm.  Normal s1,s2. No obvious murmurs, gallops, or rubs noted.  Palpable  radial and pedal pulses bilaterally Lungs: CTAB, no wheezes, rhonchi, or rales noted.  Respiratory effort nonlabored Abd: soft, NT, ND, +BS, no masses, hernias, or organomegaly MS: all 4 extremities are symmetrical with no cyanosis or edema.  He does have BUE clubbing Skin: warm and dry with no masses, lesions, or rashes Psych: A&Ox3 with an appropriate affect.   Results for orders placed during the hospital encounter of 11/11/11 (from the past 48 hour(s))  CBC     Status: Abnormal   Collection Time   11/11/11  8:28 AM      Component Value Range Comment   WBC 13.6 (*) 4.0 - 10.5 (K/uL)    RBC 4.79  4.22 - 5.81 (MIL/uL)    Hemoglobin 16.8  13.0 - 17.0 (g/dL)    HCT 45.4  09.8 - 11.9 (%)    MCV 99.4  78.0 - 100.0 (fL)    MCH 35.1 (*) 26.0 - 34.0 (pg)    MCHC 35.3  30.0 - 36.0 (g/dL)    RDW 14.7  82.9 - 56.2 (%)    Platelets 246  150 - 400 (K/uL)   COMPREHENSIVE METABOLIC PANEL     Status: Abnormal   Collection Time   11/11/11  8:28 AM      Component Value Range Comment   Sodium 133 (*) 135 - 145 (mEq/L)    Potassium 4.3  3.5 - 5.1 (mEq/L)    Chloride 91 (*) 96 - 112 (mEq/L)    CO2 21  19 - 32 (mEq/L)    Glucose, Bld 338 (*) 70 - 99 (mg/dL)    BUN 7  6 - 23 (mg/dL)    Creatinine, Ser 1.30 (*) 0.50 - 1.35 (mg/dL)    Calcium 9.9  8.4 - 10.5 (mg/dL)    Total Protein 7.3  6.0 - 8.3 (g/dL)    Albumin 3.8  3.5 - 5.2 (g/dL)    AST 14  0 - 37 (U/L) HEMOLYSIS AT THIS LEVEL MAY AFFECT RESULT   ALT 10  0 - 53 (U/L)    Alkaline Phosphatase  66  39 - 117 (U/L)    Total Bilirubin 0.3  0.3 - 1.2 (mg/dL)    GFR calc non Af Amer >90  >90 (mL/min)    GFR calc Af Amer >90  >90 (mL/min)   LIPASE, BLOOD     Status: Abnormal   Collection Time   11/11/11  8:28 AM      Component Value Range Comment   Lipase 81 (*) 11 - 59 (U/L)   POCT I-STAT TROPONIN I     Status: Normal   Collection Time   11/11/11  8:37 AM      Component Value Range Comment   Troponin i, poc 0.00  0.00 - 0.08 (ng/mL)    Comment  3            POCT I-STAT, CHEM 8     Status: Abnormal   Collection Time   11/11/11  8:38 AM      Component Value Range Comment   Sodium 133 (*) 135 - 145 (mEq/L)    Potassium 4.2  3.5 - 5.1 (mEq/L)    Chloride 97  96 - 112 (mEq/L)    BUN 5 (*) 6 - 23 (mg/dL)    Creatinine, Ser 0.45  0.50 - 1.35 (mg/dL)    Glucose, Bld 409 (*) 70 - 99 (mg/dL)    Calcium, Ion 8.11  1.12 - 1.32 (mmol/L)    TCO2 22  0 - 100 (mmol/L)    Hemoglobin 17.7 (*) 13.0 - 17.0 (g/dL)    HCT 91.4  78.2 - 95.6 (%)   GLUCOSE, CAPILLARY     Status: Abnormal   Collection Time   11/11/11 11:58 AM      Component Value Range Comment   Glucose-Capillary 290 (*) 70 - 99 (mg/dL)    Comment 1 Notify RN     CARDIAC PANEL(CRET KIN+CKTOT+MB+TROPI)     Status: Normal   Collection Time   11/11/11  3:00 PM      Component Value Range Comment   Total CK 24  7 - 232 (U/L)    CK, MB 1.8  0.3 - 4.0 (ng/mL)    Troponin I <0.30  <0.30 (ng/mL)    Relative Index RELATIVE INDEX IS INVALID  0.0 - 2.5    GLUCOSE, CAPILLARY     Status: Abnormal   Collection Time   11/11/11  4:36 PM      Component Value Range Comment   Glucose-Capillary 233 (*) 70 - 99 (mg/dL)    Comment 1 Notify RN     GLUCOSE, CAPILLARY     Status: Abnormal   Collection Time   11/11/11  8:39 PM      Component Value Range Comment   Glucose-Capillary 250 (*) 70 - 99 (mg/dL)    Comment 1 Notify RN     CARDIAC PANEL(CRET KIN+CKTOT+MB+TROPI)     Status: Normal   Collection Time   11/11/11  9:12 PM      Component Value Range Comment   Total CK 31  7 - 232 (U/L)    CK, MB 1.9  0.3 - 4.0 (ng/mL)    Troponin I <0.30  <0.30 (ng/mL)    Relative Index RELATIVE INDEX IS INVALID  0.0 - 2.5    CARDIAC PANEL(CRET KIN+CKTOT+MB+TROPI)     Status: Normal   Collection Time   11/12/11  2:51 AM      Component Value Range Comment   Total CK 32  7 - 232 (U/L)    CK,  MB 1.8  0.3 - 4.0 (ng/mL)    Troponin I <0.30  <0.30 (ng/mL)    Relative Index RELATIVE INDEX IS INVALID  0.0 - 2.5      COMPREHENSIVE METABOLIC PANEL     Status: Abnormal   Collection Time   11/12/11  2:51 AM      Component Value Range Comment   Sodium 135  135 - 145 (mEq/L)    Potassium 4.2  3.5 - 5.1 (mEq/L) HEMOLYSIS AT THIS LEVEL MAY AFFECT RESULT   Chloride 101  96 - 112 (mEq/L)    CO2 23  19 - 32 (mEq/L)    Glucose, Bld 146 (*) 70 - 99 (mg/dL)    BUN 5 (*) 6 - 23 (mg/dL)    Creatinine, Ser 1.61 (*) 0.50 - 1.35 (mg/dL) DELTA CHECK NOTED   Calcium 8.7  8.4 - 10.5 (mg/dL)    Total Protein 6.0  6.0 - 8.3 (g/dL)    Albumin 2.9 (*) 3.5 - 5.2 (g/dL)    AST 28  0 - 37 (U/L)    ALT 9  0 - 53 (U/L)    Alkaline Phosphatase 40  39 - 117 (U/L)    Total Bilirubin 0.3  0.3 - 1.2 (mg/dL)    GFR calc non Af Amer >90  >90 (mL/min)    GFR calc Af Amer >90  >90 (mL/min)   CBC     Status: Abnormal   Collection Time   11/12/11  2:51 AM      Component Value Range Comment   WBC 9.3  4.0 - 10.5 (K/uL)    RBC 3.98 (*) 4.22 - 5.81 (MIL/uL)    Hemoglobin 14.0  13.0 - 17.0 (g/dL) DELTA CHECK NOTED   HCT 38.9 (*) 39.0 - 52.0 (%)    MCV 97.7  78.0 - 100.0 (fL)    MCH 35.2 (*) 26.0 - 34.0 (pg)    MCHC 36.0  30.0 - 36.0 (g/dL)    RDW 09.6  04.5 - 40.9 (%)    Platelets 235  150 - 400 (K/uL)   LIPASE, BLOOD     Status: Normal   Collection Time   11/12/11  2:51 AM      Component Value Range Comment   Lipase 33  11 - 59 (U/L)   GLUCOSE, CAPILLARY     Status: Abnormal   Collection Time   11/12/11  7:27 AM      Component Value Range Comment   Glucose-Capillary 150 (*) 70 - 99 (mg/dL)    Comment 1 Notify RN      US Abdomen Complete  11/11/2011  *RADIOLOGY REPORT*  Clinical Data:  Abdominal pain  COMPLETE ABDOMINAL ULTRASOUND  Comparison:  CT abdomen pelvis of 05/01/2010  Findings:  Gallbladder:  Small nonshadowing gallstones are present within the gallbladder.  There is no pain of the gallbladder with compression.  Common bile duct:  The common bile duct is prominent measuring 10.9 mm in diameter although no definite  calculus is evident.  Liver:  The liver has a normal echogenic pattern.  No ductal dilatation is seen.  IVC:  Appears normal.  Pancreas:  There is a complex cystic structure in the neck - proximal body of the pancreas measuring 5.6 x 4.1 x 5.6 cm consistent with pancreatic pseudocyst with calcifications.  The more distal pancreas is difficult to evaluate due to bowel gas.  Spleen:  The spleen is normal measuring 5.6 cm sagittally.  Right Kidney:  No hydronephrosis is seen.  The right kidney measures 12.4 cm sagittally.  Left Kidney:  No hydronephrosis is noted.  The left kidney measures 10.6 cm.  A small cyst is present in the lower pole of 0.9 mm in diameter.  Abdominal aorta:  The aorta is partially obscured by bowel gas.  No aneurysm is evident.  IMPRESSION:  1.  Probable pancreatic pseudocyst in the neck - proximal body of 5.6 cm. 2.  Small nonshadowing gallstones.  No pain over the gallbladder. 3.  Prominent common bile duct.  No definite common bile duct calculus.  Correlate with LFTs.  Original Report Authenticated By: Juline Patch, M.D.   Ct Abdomen Pelvis W Contrast  11/11/2011  *RADIOLOGY REPORT*  Clinical Data: Right chest pain.  Evaluate for pancreatitis.  CT ABDOMEN AND PELVIS WITH CONTRAST  Technique:  Multidetector CT imaging of the abdomen and pelvis was performed following the standard protocol during bolus administration of intravenous contrast.  Contrast: 80mL OMNIPAQUE IOHEXOL 300 MG/ML IJ SOLN  Comparison: Ultrasound abdomen 11/11/2011 and MR abdomen 05/02/2010.  CT abdomen pelvis 05/01/2010.  Findings: Lung bases show no acute findings.  Heart size normal. No pericardial or pleural effusion.  There may be mild thickening of the distal esophageal wall, which can be seen with gastroesophageal reflux disease.  A 10 mm hyperattenuating lesion is seen in the right hepatic lobe, stable from prior exams.  Liver and gallbladder are otherwise unremarkable.  Mild nodular thickening of the adrenal  glands, stable.  Right kidney is unremarkable.  Sub centimeter low attenuation lesions in the left kidney are unchanged.  Spleen is unremarkable.  Calcifications are seen in the pancreas with mild pancreatic ductal prominence.  A low attenuation lesion in the pancreatic neck measures 3.5 x 3.8 cm (previously 3.2 x 2.9 cm).  No peripancreatic inflammation.  Stomach and bowel are unremarkable.  There is a complex lesion in the left groin, which is predominantly low in attenuation, measuring 1.8 x 2.6 cm, new from the 05/01/2010.  No pathologically enlarged intra-abdominal or intrapelvic lymph nodes.  Atherosclerotic calcification of the arterial vasculature without abdominal aortic aneurysm.  No free fluid.  No worrisome lytic or sclerotic lesions. Sclerosis is seen in the femoral heads, left greater than right.  IMPRESSION:  1.  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  No evidence of acute pancreatitis. 2. New complex nodular lesion in the left groin.  Question recent surgery.  A necrotic lymph node cannot be definitively excluded. 3.  Stable hyperattenuating lesion in the right hepatic lobe, previously suggested to represent focal nodular hyperplasia. 4.  Avascular necrosis of the femoral heads, left greater than right.  Original Report Authenticated By: Reyes Ivan, M.D.   Dg Chest Port 1 View  11/11/2011  *RADIOLOGY REPORT*  Clinical Data: Right-sided chest pain, history of smoking, diabetes, seizures  PORTABLE CHEST - 1 VIEW  Comparison: Chest x-ray of 06/23/2011  Findings: The lungs are clear and hyperaerated.  Mediastinal contours appear normal.  The heart is within upper limits of normal.  No bony abnormality is seen.  IMPRESSION: No active lung disease.  No change in slight hyperaeration.  Original Report Authenticated By: Juline Patch, M.D.       Assessment/Plan 1. Biliary colic 2. Chronic pancreatitis 3. Pancreatic  pseudocyst  Plan: 1. D/w MD.  Secondary to this pseudocyst, it is unsafe to perform cholecystectomy at this time.  I have discussed this with the patient.  I do believe that he  had an episode of biliary colic as the source of his pain.  I think that when his pseudocyst resolves, he would benefit from cholecystectomy.  I agree with Dr. Matthias Hughs to place the patient on Ursodiol.  If this cyst is felt to be matured, the patient may benefit from repeat EUS as an outpatient with drainage, if Dr. Dulce Sellar still does this.  Once the pseudocyst has resolved then I think the patient would be a candidate for surgical intervention.  No surgical intervention planned this admission.  Would recommend advancing his diet to low fat and maintain a low fat diet as an outpatient. 2. The patient also complains of left hip pain since his recent surgery in January.  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  He would benefit from outpatient follow up with PCP or ortho to have this evaluated.  Kylon Philbrook E 11/12/2011, 10:52 AM

## 2011-11-12 NOTE — Progress Notes (Signed)
Inpatient Diabetes Program Recommendations  AACE/ADA: New Consensus Statement on Inpatient Glycemic Control (2009)  Target Ranges:  Prepandial:   less than 140 mg/dL      Peak postprandial:   less than 180 mg/dL (1-2 hours)      Critically ill patients:  140 - 180 mg/dL   Reason for Visit: Results for Mitchell Mccall, Mitchell Mccall (MRN [id number]) as of 11/12/2011 13:30  Ref. Range 11/11/2011 11:58 11/11/2011 16:36 11/11/2011 20:39 11/12/2011 07:27 11/12/2011 11:35  Glucose-Capillary Latest Range: 70-99 mg/dL 213 (H) 086 (H) 578 (H) 150 (H) 208 (H)    Inpatient Diabetes Program Recommendations Insulin - Meal Coverage: Please add Novolog meal coverage 6 units tid with meals.  Note: Will follow.

## 2011-11-13 LAB — BASIC METABOLIC PANEL
BUN: 3 mg/dL — ABNORMAL LOW (ref 6–23)
Creatinine, Ser: 0.32 mg/dL — ABNORMAL LOW (ref 0.50–1.35)
GFR calc Af Amer: >90 mL/min (ref >90)
GFR calc non Af Amer: >90 mL/min (ref >90)
Glucose, Bld: 111 mg/dL — ABNORMAL HIGH (ref 70–99)

## 2011-11-13 LAB — CBC
HCT: 39.5 % (ref 39.0–52.0)
Hemoglobin: 13.5 g/dL (ref 13.0–17.0)
MCHC: 34.2 g/dL (ref 30.0–36.0)
MCV: 100.3 fL — ABNORMAL HIGH (ref 78.0–100.0)
RDW: 12.6 % (ref 11.5–15.5)

## 2011-11-13 LAB — GLUCOSE, CAPILLARY: Glucose-Capillary: 88 mg/dL (ref 70–99)

## 2011-11-13 MED ORDER — DIPHENHYDRAMINE HCL 25 MG PO CAPS
25.0000 mg | ORAL_CAPSULE | Freq: Four times a day (QID) | ORAL | Status: DC | PRN
  Administered 2011-11-13: 25 mg via ORAL
  Filled 2011-11-13: qty 1

## 2011-11-13 MED ORDER — OXYCODONE HCL 5 MG PO TABS: 5.0000 mg | ORAL_TABLET | Freq: Four times a day (QID) | ORAL | Status: DC | PRN

## 2011-11-13 MED ORDER — POTASSIUM CHLORIDE CRYS ER 20 MEQ PO TBCR
40.0000 meq | EXTENDED_RELEASE_TABLET | Freq: Once | ORAL | Status: AC
  Administered 2011-11-13: 40 meq via ORAL
  Filled 2011-11-13: qty 2

## 2011-11-13 MED ORDER — NICOTINE 14 MG/24HR TD PT24: 1.0000 | MEDICATED_PATCH | Freq: Every day | TRANSDERMAL | Status: AC

## 2011-11-13 MED ORDER — URSODIOL 300 MG PO CAPS: 300.0000 mg | ORAL_CAPSULE | Freq: Two times a day (BID) | ORAL | Status: DC

## 2011-11-13 NOTE — Discharge Summary (Signed)
Discharge Summary  Mitchell Mccall MR#: [id number]  DOB:[birth year]  Date of Admission: 11/11/2011 Date of Discharge: 11/13/2011  Patient's PCP: [name], MD, MD  Attending Physician:Cataleyah Colborn A  Consults: Treatment Team:  Florencia Reasons, MD, GI Dr. Magnus Ivan, General Surgery  Discharge Diagnoses: Principal Problem:  *Biliary colic Active Problems:  PVD (peripheral vascular disease), [facility] PTA 11/12  Epigastric pain  Peripheral vascular disease  GERD (gastroesophageal reflux disease)  Seizures  Hypertension  Depression  Anemia  Pancreatitis, alcoholic  DTs (delirium tremens)  Pancreatic pseudocyst  DM (diabetes mellitus), type 2, uncontrolled with complications  Hyponatremia  Brief Admitting History and Physical 61 year old Caucasian male with history of diabetes, peripheral vascular disease, GERD, hypertension, seizure disorder, anxiety, alcohol use, history of delirium tremens, history of alcoholic hepatitis who presented with epigastric and right upper quadrant pain on 11/11/2011.  Discharge Medications Medication List  As of 11/13/2011  9:41 AM   TAKE these medications         DULoxetine 20 MG capsule   Commonly known as: CYMBALTA   Take 20 mg by mouth 2 (two) times daily.      ergocalciferol 50000 UNITS capsule   Commonly known as: VITAMIN D2   Take 50,000 Units by mouth once a week. SUNDAY      folic acid 1 MG tablet   Commonly known as: FOLVITE   Take 1 mg by mouth daily.      gabapentin 300 MG capsule   Commonly known as: NEURONTIN   Take 300 mg by mouth 3 (three) times daily.      insulin glargine 100 UNIT/ML injection   Commonly known as: LANTUS   Inject 50-60 Units into the skin daily. Depends on CBG      l-methylfolate-B6-B12 3-35-2 MG Tabs   Commonly known as: METANX   Take 1 tablet by mouth 2 (two) times daily.      multivitamins ther. w/minerals Tabs   Take 1 tablet by mouth daily.      nicotine 14 mg/24hr patch   Commonly known  as: NICODERM CQ - dosed in mg/24 hours   Place 1 patch onto the skin daily. Did not smoke while on the nicotine patch.      nitroGLYCERIN 0.4 MG SL tablet   Commonly known as: NITROSTAT   Place 1 tablet (0.4 mg total) under the tongue every 5 (five) minutes x 3 doses as needed for chest pain (if needed for severe chest pain/pressure).      oxyCODONE 5 MG immediate release tablet   Commonly known as: Oxy IR/ROXICODONE   Take 1 tablet (5 mg total) by mouth every 6 (six) hours as needed for pain. For pain      phenytoin 100 MG ER capsule   Commonly known as: DILANTIN   Take 100-300 mg by mouth 3 (three) times daily. Take 2 capsule in the morning, 1 capsule at noon, and 3 capsules at bedtime      pioglitazone 30 MG tablet   Commonly known as: ACTOS   Take 30 mg by mouth daily.      ursodiol 300 MG capsule   Commonly known as: ACTIGALL   Take 1 capsule (300 mg total) by mouth 2 (two) times daily.      vitamin B-12 1000 MCG tablet   Commonly known as: CYANOCOBALAMIN   Take 1,000 mcg by mouth daily.      ZENPEP 25000 UNITS Cpep   Generic drug: Pancrelipase (Lip-Prot-Amyl)   Take 1 capsule by  mouth 3 (three) times daily before meals.            Hospital Course: 1. Epigastric pain/right upper quadrant pain, likely due to biliary colic. Lipase elevated suggesting possible pancreatitis versus "gallstone attack."  Initially the patient was made n.p.o., was started on a clear liquid diet and advanced as tolerated, patient tolerating solid food prior to discharge.  Abdominal ultrasound showed pancreatic pseudocyst in the neck-proximal body measuring 5.6 cm, small non-shadowing gallstones noted, prominent common bile duct noted.  CT of the abdomen and pelvis showed chronic calcific pancreatitis with interval enlargement of a pseudocyst in the pancreatic neck.  No evidence of acute pancreatitis. New complex nodular lesion in the left groin. GI Dr. Matthias Hughs has evaluated the patient and  recommended outpatient followup for his pancreatic pseudocyst. CA 19-9 was checked given pseudocyst, CA 19-9 was low at 25.1.  General surgery evaluated the patient on 11/12/2011 for possible cholecystectomy, because of pancreatic pseudocyst it was deemed that the patient was unsafe to perform cholecystectomy at this time, if pseudocyst resolves as outpatient, the patient would benefit from cholecystectomy.  Patient was started on ursodiol for his gallstones.  Patient to followup with GI and surgery as outpatient for further management.  Initially the patient was placed on telemetry and was ruled out for acute coronary syndrome, telemetry was discontinued as no events were noted on telemetry and troponins were negative x3.  2. Pancreatic pseudocyst. Management as per GI as outpatient.   3. History of peripheral vascular disease. Stable.   4. GERD. Continue PPI twice a day.   5. Hyponatremia.  Resolved.  Likely due to uncontrolled diabetes.   6. Uncontrolled type 2 diabetes with complications. Continue Lantus and moderate sliding scale insulin.  Better controlled.  7. History of anemia. Resolved.   8. History of alcohol abuse. Counseled the patient on alcohol cessation.  Continue CIWA protocol.  9. Tobacco abuse. Again counseled the patient on cessation. Nicotine patch ordered.   10. History of seizures. With questionable episode of seizure while in the emergency department. Continue Dilantin. Continue frequent neuro checks.  No further episodes.  11. Hypertension. Stable.   Day of Discharge BP 132/84  Pulse 76  Temp(Src) 97.8 F (36.6 C) (Oral)  Resp 18  Ht 5\' 9"  (1.753 m)  Wt 62.596 kg (138 lb)  BMI 20.38 kg/m2  SpO2 99%  Results for orders placed during the hospital encounter of 11/11/11 (from the past 48 hour(s))  GLUCOSE, CAPILLARY     Status: Abnormal   Collection Time   11/11/11 11:58 AM      Component Value Range Comment   Glucose-Capillary 290 (*) 70 - 99 (mg/dL)     Comment 1 Notify RN     CARDIAC PANEL(CRET KIN+CKTOT+MB+TROPI)     Status: Normal   Collection Time   11/11/11  3:00 PM      Component Value Range Comment   Total CK 24  7 - 232 (U/L)    CK, MB 1.8  0.3 - 4.0 (ng/mL)    Troponin I <0.30  <0.30 (ng/mL)    Relative Index RELATIVE INDEX IS INVALID  0.0 - 2.5    GLUCOSE, CAPILLARY     Status: Abnormal   Collection Time   11/11/11  4:36 PM      Component Value Range Comment   Glucose-Capillary 233 (*) 70 - 99 (mg/dL)    Comment 1 Notify RN     GLUCOSE, CAPILLARY     Status: Abnormal  Collection Time   11/11/11  8:39 PM      Component Value Range Comment   Glucose-Capillary 250 (*) 70 - 99 (mg/dL)    Comment 1 Notify RN     CARDIAC PANEL(CRET KIN+CKTOT+MB+TROPI)     Status: Normal   Collection Time   11/11/11  9:12 PM      Component Value Range Comment   Total CK 31  7 - 232 (U/L)    CK, MB 1.9  0.3 - 4.0 (ng/mL)    Troponin I <0.30  <0.30 (ng/mL)    Relative Index RELATIVE INDEX IS INVALID  0.0 - 2.5    CARDIAC PANEL(CRET KIN+CKTOT+MB+TROPI)     Status: Normal   Collection Time   11/12/11  2:51 AM      Component Value Range Comment   Total CK 32  7 - 232 (U/L)    CK, MB 1.8  0.3 - 4.0 (ng/mL)    Troponin I <0.30  <0.30 (ng/mL)    Relative Index RELATIVE INDEX IS INVALID  0.0 - 2.5    CANCER ANTIGEN 19-9     Status: Abnormal   Collection Time   11/12/11  2:51 AM      Component Value Range Comment   CA 19-9 25.1 (*) <35.0 (U/mL)   COMPREHENSIVE METABOLIC PANEL     Status: Abnormal   Collection Time   11/12/11  2:51 AM      Component Value Range Comment   Sodium 135  135 - 145 (mEq/L)    Potassium 4.2  3.5 - 5.1 (mEq/L) HEMOLYSIS AT THIS LEVEL MAY AFFECT RESULT   Chloride 101  96 - 112 (mEq/L)    CO2 23  19 - 32 (mEq/L)    Glucose, Bld 146 (*) 70 - 99 (mg/dL)    BUN 5 (*) 6 - 23 (mg/dL)    Creatinine, Ser 1.61 (*) 0.50 - 1.35 (mg/dL) DELTA CHECK NOTED   Calcium 8.7  8.4 - 10.5 (mg/dL)    Total Protein 6.0  6.0 - 8.3 (g/dL)     Albumin 2.9 (*) 3.5 - 5.2 (g/dL)    AST 28  0 - 37 (U/L)    ALT 9  0 - 53 (U/L)    Alkaline Phosphatase 40  39 - 117 (U/L)    Total Bilirubin 0.3  0.3 - 1.2 (mg/dL)    GFR calc non Af Amer >90  >90 (mL/min)    GFR calc Af Amer >90  >90 (mL/min)   CBC     Status: Abnormal   Collection Time   11/12/11  2:51 AM      Component Value Range Comment   WBC 9.3  4.0 - 10.5 (K/uL)    RBC 3.98 (*) 4.22 - 5.81 (MIL/uL)    Hemoglobin 14.0  13.0 - 17.0 (g/dL) DELTA CHECK NOTED   HCT 38.9 (*) 39.0 - 52.0 (%)    MCV 97.7  78.0 - 100.0 (fL)    MCH 35.2 (*) 26.0 - 34.0 (pg)    MCHC 36.0  30.0 - 36.0 (g/dL)    RDW 09.6  04.5 - 40.9 (%)    Platelets 235  150 - 400 (K/uL)   LIPASE, BLOOD     Status: Normal   Collection Time   11/12/11  2:51 AM      Component Value Range Comment   Lipase 33  11 - 59 (U/L)   GLUCOSE, CAPILLARY     Status: Abnormal   Collection Time  11/12/11  7:27 AM      Component Value Range Comment   Glucose-Capillary 150 (*) 70 - 99 (mg/dL)    Comment 1 Notify RN     GLUCOSE, CAPILLARY     Status: Abnormal   Collection Time   11/12/11 11:35 AM      Component Value Range Comment   Glucose-Capillary 208 (*) 70 - 99 (mg/dL)    Comment 1 Notify RN     GLUCOSE, CAPILLARY     Status: Abnormal   Collection Time   11/12/11  4:43 PM      Component Value Range Comment   Glucose-Capillary 227 (*) 70 - 99 (mg/dL)    Comment 1 Notify RN     GLUCOSE, CAPILLARY     Status: Abnormal   Collection Time   11/12/11  8:36 PM      Component Value Range Comment   Glucose-Capillary 175 (*) 70 - 99 (mg/dL)    Comment 1 Notify RN     CBC     Status: Abnormal   Collection Time   11/13/11  6:15 AM      Component Value Range Comment   WBC 10.6 (*) 4.0 - 10.5 (K/uL)    RBC 3.94 (*) 4.22 - 5.81 (MIL/uL)    Hemoglobin 13.5  13.0 - 17.0 (g/dL)    HCT 16.1  09.6 - 04.5 (%)    MCV 100.3 (*) 78.0 - 100.0 (fL)    MCH 34.3 (*) 26.0 - 34.0 (pg)    MCHC 34.2  30.0 - 36.0 (g/dL)    RDW 40.9  81.1 - 91.4  (%)    Platelets 230  150 - 400 (K/uL)   BASIC METABOLIC PANEL     Status: Abnormal   Collection Time   11/13/11  6:15 AM      Component Value Range Comment   Sodium 138  135 - 145 (mEq/L)    Potassium 2.9 (*) 3.5 - 5.1 (mEq/L)    Chloride 103  96 - 112 (mEq/L)    CO2 25  19 - 32 (mEq/L)    Glucose, Bld 111 (*) 70 - 99 (mg/dL)    BUN 3 (*) 6 - 23 (mg/dL)    Creatinine, Ser 7.82 (*) 0.50 - 1.35 (mg/dL)    Calcium 8.6  8.4 - 10.5 (mg/dL)    GFR calc non Af Amer >90  >90 (mL/min)    GFR calc Af Amer >90  >90 (mL/min)   GLUCOSE, CAPILLARY     Status: Normal   Collection Time   11/13/11  7:17 AM      Component Value Range Comment   Glucose-Capillary 88  70 - 99 (mg/dL)    Comment 1 Notify RN       US Abdomen Complete  11/11/2011  *RADIOLOGY REPORT*  Clinical Data:  Abdominal pain  COMPLETE ABDOMINAL ULTRASOUND  Comparison:  CT abdomen pelvis of 05/01/2010  Findings:  Gallbladder:  Small nonshadowing gallstones are present within the gallbladder.  There is no pain of the gallbladder with compression.  Common bile duct:  The common bile duct is prominent measuring 10.9 mm in diameter although no definite calculus is evident.  Liver:  The liver has a normal echogenic pattern.  No ductal dilatation is seen.  IVC:  Appears normal.  Pancreas:  There is a complex cystic structure in the neck - proximal body of the pancreas measuring 5.6 x 4.1 x 5.6 cm consistent with pancreatic pseudocyst with calcifications.  The more  distal pancreas is difficult to evaluate due to bowel gas.  Spleen:  The spleen is normal measuring 5.6 cm sagittally.  Right Kidney:  No hydronephrosis is seen.  The right kidney measures 12.4 cm sagittally.  Left Kidney:  No hydronephrosis is noted.  The left kidney measures 10.6 cm.  A small cyst is present in the lower pole of 0.9 mm in diameter.  Abdominal aorta:  The aorta is partially obscured by bowel gas.  No aneurysm is evident.  IMPRESSION:  1.  Probable pancreatic pseudocyst in  the neck - proximal body of 5.6 cm. 2.  Small nonshadowing gallstones.  No pain over the gallbladder. 3.  Prominent common bile duct.  No definite common bile duct calculus.  Correlate with LFTs.  Original Report Authenticated By: Juline Patch, M.D.   Ct Abdomen Pelvis W Contrast  11/11/2011  *RADIOLOGY REPORT*  Clinical Data: Right chest pain.  Evaluate for pancreatitis.  CT ABDOMEN AND PELVIS WITH CONTRAST  Technique:  Multidetector CT imaging of the abdomen and pelvis was performed following the standard protocol during bolus administration of intravenous contrast.  Contrast: 80mL OMNIPAQUE IOHEXOL 300 MG/ML IJ SOLN  Comparison: Ultrasound abdomen 11/11/2011 and MR abdomen 05/02/2010.  CT abdomen pelvis 05/01/2010.  Findings: Lung bases show no acute findings.  Heart size normal. No pericardial or pleural effusion.  There may be mild thickening of the distal esophageal wall, which can be seen with gastroesophageal reflux disease.  A 10 mm hyperattenuating lesion is seen in the right hepatic lobe, stable from prior exams.  Liver and gallbladder are otherwise unremarkable.  Mild nodular thickening of the adrenal glands, stable.  Right kidney is unremarkable.  Sub centimeter low attenuation lesions in the left kidney are unchanged.  Spleen is unremarkable.  Calcifications are seen in the pancreas with mild pancreatic ductal prominence.  A low attenuation lesion in the pancreatic neck measures 3.5 x 3.8 cm (previously 3.2 x 2.9 cm).  No peripancreatic inflammation.  Stomach and bowel are unremarkable.  There is a complex lesion in the left groin, which is predominantly low in attenuation, measuring 1.8 x 2.6 cm, new from the 05/01/2010.  No pathologically enlarged intra-abdominal or intrapelvic lymph nodes.  Atherosclerotic calcification of the arterial vasculature without abdominal aortic aneurysm.  No free fluid.  No worrisome lytic or sclerotic lesions. Sclerosis is seen in the femoral heads, left greater than  right.  IMPRESSION:  1.  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  No evidence of acute pancreatitis. 2. New complex nodular lesion in the left groin.  Question recent surgery.  A necrotic lymph node cannot be definitively excluded. 3.  Stable hyperattenuating lesion in the right hepatic lobe, previously suggested to represent focal nodular hyperplasia. 4.  Avascular necrosis of the femoral heads, left greater than right.  Original Report Authenticated By: Reyes Ivan, M.D.   Dg Chest Port 1 View  11/11/2011  *RADIOLOGY REPORT*  Clinical Data: Right-sided chest pain, history of smoking, diabetes, seizures  PORTABLE CHEST - 1 VIEW  Comparison: Chest x-ray of 06/23/2011  Findings: The lungs are clear and hyperaerated.  Mediastinal contours appear normal.  The heart is within upper limits of normal.  No bony abnormality is seen.  IMPRESSION: No active lung disease.  No change in slight hyperaeration.  Original Report Authenticated By: Juline Patch, M.D.   Disposition: Home  Diet: Diabetic diet  Activity: Resume as tolerated   Follow-up Appts: Discharge Orders  Future Orders Please Complete By Expires   Diet Carb Modified      Increase activity slowly      Discharge instructions      Comments:   Followup with [name], MD (PCP) in 1 week. Followup with Dr. Ewing Schlein (GI). Followup with general surgery.  Please abstain from drinking alcohol.      TESTS THAT NEED FOLLOW-UP None  Time spent on discharge, talking to the patient, and coordinating care: 35 mins.   Signed: Cristal Ford, MD 11/13/2011, 9:41 AM

## 2011-11-13 NOTE — Progress Notes (Signed)
Subjective: Abdominal pain improved.  Tolerating oral diet.  Objective: Vital signs in last 24 hours: Filed Vitals:   11/12/11 0757 11/12/11 1435 11/12/11 2100 11/13/11 0500  BP: 114/61 129/69 120/68 132/84  Pulse: 89 102 83 76  Temp: 97.9 F (36.6 C) 98.2 F (36.8 C) 97.8 F (36.6 C)   TempSrc: Oral  Oral   Resp: 18 18 18 18   Height:      Weight:    62.596 kg (138 lb)  SpO2: 100% 99% 97% 99%   Weight change: -5.704 kg (-12 lb 9.2 oz)  Intake/Output Summary (Last 24 hours) at 11/13/11 0932 Last data filed at 11/13/11 0858  Gross per 24 hour  Intake    780 ml  Output      0 ml  Net    780 ml    Physical Exam: General: Awake, Oriented, No acute distress. HEENT: EOMI. Neck: Supple CV: S1 and S2 Lungs: Clear to ascultation bilaterally Abdomen: Soft, Nontender, Nondistended, +bowel sounds. Ext: Good pulses. Trace edema.  Lab Results:  [facility] 11/13/11 0615 11/12/11 0251  NA 138 135  K 2.9* 4.2  CL 103 101  CO2 25 23  GLUCOSE 111* 146*  BUN 3* 5*  CREATININE 0.32* 0.34*  CALCIUM 8.6 8.7  MG -- --  PHOS -- --    Basename 11/12/11 0251 11/11/11 0828  AST 28 14  ALT 9 10  ALKPHOS 40 66  BILITOT 0.3 0.3  PROT 6.0 7.3  ALBUMIN 2.9* 3.8    Basename 11/12/11 0251 11/11/11 0828  LIPASE 33 81*  AMYLASE -- --    Basename 11/13/11 0615 11/12/11 0251  WBC 10.6* 9.3  NEUTROABS -- --  HGB 13.5 14.0  HCT 39.5 38.9*  MCV 100.3* 97.7  PLT 230 235    Basename 11/12/11 0251 11/11/11 2112 11/11/11 1500  CKTOTAL 32 31 24  CKMB 1.8 1.9 1.8  CKMBINDEX -- -- --  TROPONINI <0.30 <0.30 <0.30   No components found with this basename: POCBNP:3 No results found for this basename: DDIMER:2 in the last 72 hours No results found for this basename: HGBA1C:2 in the last 72 hours No results found for this basename: CHOL:2,HDL:2,LDLCALC:2,TRIG:2,CHOLHDL:2,LDLDIRECT:2 in the last 72 hours No results found for this basename: TSH,T4TOTAL,FREET3,T3FREE,THYROIDAB in the last  72 hours No results found for this basename: VITAMINB12:2,FOLATE:2,FERRITIN:2,TIBC:2,IRON:2,RETICCTPCT:2 in the last 72 hours  Micro Results: No results found for this or any previous visit (from the past 240 hour(s)).  Studies/Results: US Abdomen Complete  11/11/2011  *RADIOLOGY REPORT*  Clinical Data:  Abdominal pain  COMPLETE ABDOMINAL ULTRASOUND  Comparison:  CT abdomen pelvis of 05/01/2010  Findings:  Gallbladder:  Small nonshadowing gallstones are present within the gallbladder.  There is no pain of the gallbladder with compression.  Common bile duct:  The common bile duct is prominent measuring 10.9 mm in diameter although no definite calculus is evident.  Liver:  The liver has a normal echogenic pattern.  No ductal dilatation is seen.  IVC:  Appears normal.  Pancreas:  There is a complex cystic structure in the neck - proximal body of the pancreas measuring 5.6 x 4.1 x 5.6 cm consistent with pancreatic pseudocyst with calcifications.  The more distal pancreas is difficult to evaluate due to bowel gas.  Spleen:  The spleen is normal measuring 5.6 cm sagittally.  Right Kidney:  No hydronephrosis is seen.  The right kidney measures 12.4 cm sagittally.  Left Kidney:  No hydronephrosis is noted.  The left kidney measures  10.6 cm.  A small cyst is present in the lower pole of 0.9 mm in diameter.  Abdominal aorta:  The aorta is partially obscured by bowel gas.  No aneurysm is evident.  IMPRESSION:  1.  Probable pancreatic pseudocyst in the neck - proximal body of 5.6 cm. 2.  Small nonshadowing gallstones.  No pain over the gallbladder. 3.  Prominent common bile duct.  No definite common bile duct calculus.  Correlate with LFTs.  Original Report Authenticated By: Juline Patch, M.D.   Ct Abdomen Pelvis W Contrast  11/11/2011  *RADIOLOGY REPORT*  Clinical Data: Right chest pain.  Evaluate for pancreatitis.  CT ABDOMEN AND PELVIS WITH CONTRAST  Technique:  Multidetector CT imaging of the abdomen and pelvis  was performed following the standard protocol during bolus administration of intravenous contrast.  Contrast: 80mL OMNIPAQUE IOHEXOL 300 MG/ML IJ SOLN  Comparison: Ultrasound abdomen 11/11/2011 and MR abdomen 05/02/2010.  CT abdomen pelvis 05/01/2010.  Findings: Lung bases show no acute findings.  Heart size normal. No pericardial or pleural effusion.  There may be mild thickening of the distal esophageal wall, which can be seen with gastroesophageal reflux disease.  A 10 mm hyperattenuating lesion is seen in the right hepatic lobe, stable from prior exams.  Liver and gallbladder are otherwise unremarkable.  Mild nodular thickening of the adrenal glands, stable.  Right kidney is unremarkable.  Sub centimeter low attenuation lesions in the left kidney are unchanged.  Spleen is unremarkable.  Calcifications are seen in the pancreas with mild pancreatic ductal prominence.  A low attenuation lesion in the pancreatic neck measures 3.5 x 3.8 cm (previously 3.2 x 2.9 cm).  No peripancreatic inflammation.  Stomach and bowel are unremarkable.  There is a complex lesion in the left groin, which is predominantly low in attenuation, measuring 1.8 x 2.6 cm, new from the 05/01/2010.  No pathologically enlarged intra-abdominal or intrapelvic lymph nodes.  Atherosclerotic calcification of the arterial vasculature without abdominal aortic aneurysm.  No free fluid.  No worrisome lytic or sclerotic lesions. Sclerosis is seen in the femoral heads, left greater than right.  IMPRESSION:  1.  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  No evidence of acute pancreatitis. 2. New complex nodular lesion in the left groin.  Question recent surgery.  A necrotic lymph node cannot be definitively excluded. 3.  Stable hyperattenuating lesion in the right hepatic lobe, previously suggested to represent focal nodular hyperplasia. 4.  Avascular necrosis of the femoral heads, left greater than  right.  Original Report Authenticated By: Reyes Ivan, M.D.    Medications: I have reviewed the patient's current medications. Scheduled Meds:    . aspirin EC  325 mg Oral Daily  . DULoxetine  20 mg Oral BID  . enoxaparin  40 mg Subcutaneous Q24H  . folic acid  1 mg Oral Daily  . folic acid  1 mg Oral Daily  . gabapentin  300 mg Oral TID  . insulin aspart  0-15 Units Subcutaneous TID WC  . insulin glargine  50 Units Subcutaneous Daily  . LORazepam  0-4 mg Intravenous Q6H   Followed by  . LORazepam  0-4 mg Intravenous Q12H  . mulitivitamin with minerals  1 tablet Oral Daily  . nicotine  14 mg Transdermal Daily  . pantoprazole  40 mg Oral BID AC  . phenytoin  100 mg Oral Q1200  . phenytoin  200 mg Oral Q0600  . phenytoin  300  mg Oral QHS  . potassium chloride  40 mEq Oral Once  . thiamine  100 mg Oral Daily   Or  . thiamine  100 mg Intravenous Daily  . ursodiol  300 mg Oral BID  . vitamin B-12  1,000 mcg Oral Daily  . Vitamin D (Ergocalciferol)  50,000 Units Oral Q7 days  . DISCONTD: pantoprazole (PROTONIX) IV  40 mg Intravenous Q12H   Continuous Infusions:    . DISCONTD: sodium chloride Stopped (11/12/11 1015)   PRN Meds:.acetaminophen, acetaminophen, diphenhydrAMINE, diphenhydrAMINE, LORazepam, LORazepam, morphine injection, nitroGLYCERIN, ondansetron (ZOFRAN) IV, ondansetron, oxyCODONE  Assessment/Plan: 1. Epigastric pain/right upper quadrant pain, likely due to biliary colic. Lipase suggesting possible pancreatitis versus "gallstone attack."  Initially the patient was made n.p.o., was started on a clear liquid diet and advanced as tolerated, patient tolerating solid food prior to discharge.  Abdominal ultrasound showed pancreatic pseudocyst in the neck-proximal body measuring 5.6 cm, small non-shadowing gallstones noted, prominent common bile duct noted.  CT of the abdomen and pelvis showed chronic calcific pancreatitis with interval enlargement of a pseudocyst in the  pancreatic neck.  No evidence of acute pancreatitis. New complex nodular lesion in the left groin. GI Dr. Matthias Hughs has evaluated the patient. CA 19-9 low at 25.1.  General surgery evaluated the patient on 11/12/2011, because of pancreatic pseudocyst it was deemed that the patient was unsafe to perform cholecystectomy at this time, as pseudocyst resolves it was determined that the patient would benefit from cholecystectomy.  Patient was started on ursodiol.  Patient to followup with GI and surgery as outpatient for further management.  Initially the patient was placed on telemetry and was ruled out for acute coronary syndrome, telemetry was discontinued as no events were noted on telemetry and troponins were negative x3.  2. Pancreatic pseudocyst. Management as per GI as outpatient.   3. History of peripheral vascular disease. Stable.   4. GERD. Continue PPI twice a day.   5. Hyponatremia.  Resolved.  Likely due to uncontrolled diabetes.   6. Uncontrolled type 2 diabetes with complications. Continue Lantus and moderate sliding scale insulin.  Better controlled.  7. History of anemia. Resolved.   8. History of alcohol abuse. Counseled the patient on alcohol cessation.  Continue CIWA protocol.  9. Tobacco abuse. Again counseled the patient on cessation. Nicotine patch ordered.   10. History of seizures. With questionable episode of seizure while in the emergency department. Continue Dilantin. Continue frequent neuro checks.  No further episodes.  11. Hypertension. Stable.   11. Prophylaxis. Lovenox.   12. CODE STATUS. Full code.  13.  Disposition discharge the patient today.   LOS: 2 days  Deuce Paternoster A, MD 11/13/2011, 9:32 AM

## 2012-01-20 ENCOUNTER — Encounter (HOSPITAL_COMMUNITY): Payer: Self-pay | Admitting: Gastroenterology

## 2012-01-20 ENCOUNTER — Encounter (HOSPITAL_COMMUNITY)
Admission: RE | Disposition: A | Discharge status: Home / Self Care | Payer: Self-pay | Source: Ambulatory Visit | Attending: Gastroenterology

## 2012-01-20 ENCOUNTER — Ambulatory Visit (HOSPITAL_COMMUNITY): Payer: Medicare Other | Admitting: Anesthesiology

## 2012-01-20 ENCOUNTER — Encounter (HOSPITAL_COMMUNITY): Payer: Self-pay | Admitting: Anesthesiology

## 2012-01-20 ENCOUNTER — Ambulatory Visit (HOSPITAL_COMMUNITY)
Admission: RE | Admit: 2012-01-20 | Discharge: 2012-01-20 | Disposition: A | Discharge status: Home / Self Care | Payer: Medicare Other | Source: Ambulatory Visit | Attending: Gastroenterology | Admitting: Gastroenterology

## 2012-01-20 DIAGNOSIS — K8020 Calculus of gallbladder without cholecystitis without obstruction: Secondary | ICD-10-CM | POA: Insufficient documentation

## 2012-01-20 DIAGNOSIS — K862 Cyst of pancreas: Secondary | ICD-10-CM | POA: Insufficient documentation

## 2012-01-20 DIAGNOSIS — R109 Unspecified abdominal pain: Secondary | ICD-10-CM | POA: Insufficient documentation

## 2012-01-20 DIAGNOSIS — K861 Other chronic pancreatitis: Secondary | ICD-10-CM | POA: Insufficient documentation

## 2012-01-20 HISTORY — PX: EUS: SHX5427

## 2012-01-20 HISTORY — PX: FINE NEEDLE ASPIRATION: SHX5430

## 2012-01-20 LAB — GLUCOSE, CAPILLARY: Glucose-Capillary: 335 mg/dL — ABNORMAL HIGH (ref 70–99)

## 2012-01-20 LAB — PANC CYST FLD ANLYS-PATHFNDR-TG

## 2012-01-20 SURGERY — ESOPHAGEAL ENDOSCOPIC ULTRASOUND (EUS) RADIAL
Anesthesia: Monitor Anesthesia Care

## 2012-01-20 MED ORDER — PROMETHAZINE HCL 25 MG/ML IJ SOLN: 6.2500 mg | INTRAMUSCULAR | Status: DC | PRN

## 2012-01-20 MED ORDER — BUTAMBEN-TETRACAINE-BENZOCAINE 2-2-14 % EX AERO
INHALATION_SPRAY | CUTANEOUS | Status: DC | PRN
  Administered 2012-01-20: 2 via TOPICAL

## 2012-01-20 MED ORDER — DIPHENHYDRAMINE HCL 50 MG/ML IJ SOLN
INTRAMUSCULAR | Status: AC
  Filled 2012-01-20: qty 1

## 2012-01-20 MED ORDER — LACTATED RINGERS IV SOLN
INTRAVENOUS | Status: DC | PRN
  Administered 2012-01-20: 09:00:00 via INTRAVENOUS

## 2012-01-20 MED ORDER — MIDAZOLAM HCL 5 MG/5ML IJ SOLN
INTRAMUSCULAR | Status: DC | PRN
  Administered 2012-01-20: 2 mg via INTRAVENOUS

## 2012-01-20 MED ORDER — DIPHENHYDRAMINE HCL 50 MG/ML IJ SOLN: 12.5000 mg | Freq: Once | INTRAMUSCULAR | Status: DC

## 2012-01-20 MED ORDER — FENTANYL CITRATE 0.05 MG/ML IJ SOLN: 25.0000 ug | INTRAMUSCULAR | Status: DC | PRN

## 2012-01-20 MED ORDER — INSULIN REGULAR HUMAN 100 UNIT/ML IJ SOLN
8.0000 [IU] | INTRAMUSCULAR | Status: AC
  Administered 2012-01-20: 8 [IU] via SUBCUTANEOUS
  Filled 2012-01-20: qty 0.08

## 2012-01-20 MED ORDER — PROPOFOL 10 MG/ML IV EMUL
INTRAVENOUS | Status: DC | PRN
  Administered 2012-01-20: 100 ug/kg/min via INTRAVENOUS

## 2012-01-20 MED ORDER — CIPROFLOXACIN IN D5W 400 MG/200ML IV SOLN
INTRAVENOUS | Status: AC
  Filled 2012-01-20: qty 200

## 2012-01-20 MED ORDER — KETAMINE HCL 10 MG/ML IJ SOLN
INTRAMUSCULAR | Status: DC | PRN
  Administered 2012-01-20 (×2): 10 mg via INTRAVENOUS

## 2012-01-20 MED ORDER — FENTANYL CITRATE 0.05 MG/ML IJ SOLN
INTRAMUSCULAR | Status: DC | PRN
  Administered 2012-01-20: 100 ug via INTRAVENOUS

## 2012-01-20 MED ORDER — CIPROFLOXACIN HCL 500 MG PO TABS: 500.0000 mg | ORAL_TABLET | Freq: Two times a day (BID) | ORAL | Status: AC

## 2012-01-20 MED ORDER — CIPROFLOXACIN IN D5W 400 MG/200ML IV SOLN
400.0000 mg | Freq: Two times a day (BID) | INTRAVENOUS | Status: DC
  Administered 2012-01-20: 400 mg via INTRAVENOUS

## 2012-01-20 NOTE — H&P (Signed)
Patient interval history reviewed.  Patient examined again.  There has been no change from documented H/P dated 01/08/12 (scanned into chart from our office) except as documented above.   Assessment:  1.  Chronic calcific pancreatitis (alcohol-mediated). 2.  Pancreatic cyst.  Suspect pseudocyst. 3.  Gallstones. 4.  Abdominal pain.  Differential considerations include chronic pancreatitis (favored), cyst-related discomfort, symptomatic gallstones.  Plan:  1.  Endoscopic ultrasound with likely cyst aspiration (EUS +/- FNA). 2.  Risks (bleeding, infection, bowel perforation that could require surgery, sedation-related changes in cardiopulmonary systems), benefits (identification and possible treatment of source of symptoms, exclusion of certain causes of symptoms), and alternatives (watchful waiting, radiographic imaging studies, empiric medical treatment) of upper endoscopic ultrasound with possible cyst aspirtation (EUS +/- FNA) were explained to patient + wife in detail and patient wishes to proceed.

## 2012-01-20 NOTE — Anesthesia Preprocedure Evaluation (Addendum)
Anesthesia Evaluation  Patient identified by MRN, date of birth, ID band Patient awake    Reviewed: Allergy & Precautions, H&P , NPO status , Patient's Chart, lab work & pertinent test results  Airway Mallampati: II TM Distance: >3 FB Neck ROM: Full    Dental No notable dental hx.    Pulmonary pneumonia , Current Smoker,  breath sounds clear to auscultation  Pulmonary exam normal       Cardiovascular Exercise Tolerance: Good hypertension, + Peripheral Vascular Disease Rhythm:Regular Rate:Tachycardia  Denies HTN   Neuro/Psych Seizures -, Well Controlled,  PSYCHIATRIC DISORDERS Anxiety Depression    GI/Hepatic hiatal hernia, PUD, GERD-  ,(+) Hepatitis -, Unspecified  Endo/Other  negative endocrine ROSDiabetes mellitus-, Type 1, Insulin Dependent  Renal/GU negative Renal ROS  negative genitourinary   Musculoskeletal negative musculoskeletal ROS (+)   Abdominal   Peds negative pediatric ROS (+)  Hematology negative hematology ROS (+)   Anesthesia Other Findings   Reproductive/Obstetrics negative OB ROS                         Anesthesia Physical Anesthesia Plan  ASA: III  Anesthesia Plan: MAC   Post-op Pain Management:    Induction: Intravenous  Airway Management Planned:   Additional Equipment:   Intra-op Plan:   Post-operative Plan: Extubation in OR  Informed Consent: I have reviewed the patients History and Physical, chart, labs and discussed the procedure including the risks, benefits and alternatives for the proposed anesthesia with the patient or authorized representative who has indicated his/her understanding and acceptance.   Dental advisory given  Plan Discussed with: CRNA  Anesthesia Plan Comments:         Anesthesia Quick Evaluation

## 2012-01-20 NOTE — Preoperative (Signed)
Beta Blockers   Reason not to administer Beta Blockers:Not Applicable 

## 2012-01-20 NOTE — Discharge Instructions (Signed)
Endoscopic Ultrasound (EUS)  Care After  Please read the instructions outlined below and refer to this sheet in the next few weeks. These discharge instructions provide you with general information on caring for yourself after you leave the hospital. Your doctor may also give you specific instructions. While your treatment has been planned according to the most current medical practices available, unavoidable complications occasionally occur. If you have any problems or questions after discharge, please call Dr. Danetta Prom ([facility]) at [phone].  HOME CARE INSTRUCTIONS  Activity You may resume your regular activity but move at a slower pace for the next 24 hours.  Take frequent rest periods for the next 24 hours.  Walking will help expel (get rid of) the air and reduce the bloated feeling in your abdomen.  No driving for 24 hours (because of the anesthesia (medicine) used during the test).  You may shower.  Do not sign any important legal documents or operate any machinery for 24 hours (because of the anesthesia used during the test).  Nutrition Drink plenty of fluids.  You may resume your normal diet.  Begin with a light meal and progress to your normal diet.  Avoid alcoholic beverages for 24 hours or as instructed by your caregiver.   Medications You may resume your normal medications unless your caregiver tells you otherwise.  What you can expect today You may experience abdominal discomfort such as a feeling of fullness or "gas" pains.  You may experience a sore throat for 2 to 3 days. This is normal. Gargling with salt water may help this.   SEEK IMMEDIATE MEDICAL CARE IF: You have excessive nausea (feeling sick to your stomach) and/or vomiting.  You have severe abdominal pain and distention (swelling).  You have trouble swallowing.  You have a temperature over 100 F (37.8 C).  You have rectal bleeding or vomiting of blood.   Document Released: 03/24/2004  Document Revised: 04/22/2011 Document Reviewed: 10/05/2007 [employer] Patient Information 2012 [employer].  

## 2012-01-20 NOTE — Transfer of Care (Signed)
Immediate Anesthesia Transfer of Care Note  Patient: Mitchell Mccall  Procedure(s) Performed: Procedure(s) (LRB): ESOPHAGEAL ENDOSCOPIC ULTRASOUND (EUS) RADIAL (N/A) FINE NEEDLE ASPIRATION (FNA) LINEAR (N/A)  Patient Location: PACU  Anesthesia Type: MAC  Level of Consciousness: awake, alert , oriented and patient cooperative  Airway & Oxygen Therapy: Patient Spontanous Breathing and Patient connected to face mask oxygen  Post-op Assessment: Report given to PACU RN, Post -op Vital signs reviewed and stable and Patient moving all extremities X 4  Post vital signs: Reviewed and stable  Complications: No apparent anesthesia complications

## 2012-01-20 NOTE — Anesthesia Postprocedure Evaluation (Signed)
  Anesthesia Post-op Note  Patient: Mitchell Mccall  Procedure(s) Performed: Procedure(s) (LRB): ESOPHAGEAL ENDOSCOPIC ULTRASOUND (EUS) RADIAL (N/A) FINE NEEDLE ASPIRATION (FNA) LINEAR (N/A)  Patient Location: PACU  Anesthesia Type: MAC  Level of Consciousness: awake and alert   Airway and Oxygen Therapy: Patient Spontanous Breathing  Post-op Pain: mild  Post-op Assessment: Post-op Vital signs reviewed, Patient's Cardiovascular Status Stable, Respiratory Function Stable, Patent Airway and No signs of Nausea or vomiting  Post-op Vital Signs: stable  Complications: No apparent anesthesia complications

## 2012-01-20 NOTE — Op Note (Signed)
[facility] [address] [place], [place]  [address]  ENDOSCOPIC ULTRASOUND PROCEDURE REPORT  PATIENT:  Mitchell Mccall, Mitchell Mccall  MR#:  [id number] BIRTHDATE:  12/01/1950  GENDER:  male ENDOSCOPIST:  Willis Modena, MD REFERRED BY:  Bernette Redbird, M.D. PROCEDURE DATE:  01/20/2012 PROCEDURE:  Upper EUS w/FNA ASA CLASS:  Class III INDICATIONS:  abdominal pain, pancreatic cyst MEDICATIONS:   MAC sedation, administered by CRNA, Cetacaine spray x 2, cipro 400 mg IV DESCRIPTION OF PROCEDURE:   After the risks benefits and alternatives of the procedure were  explained, informed consent was obtained. The patient was then placed in the left, lateral, decubitus postion and IV sedation was administered. Throughout the procedure, the patient's blood pressure, pulse and oxygen saturations were monitored continuously.  Under direct visualization, the Pentax Linear [id number] endoscope was introduced through the mouth and advanced to the bulb of duodenum.  Water was used as necessary to provide an acoustic interface.  Upon completion of the imaging, water was removed and the patient was sent to the recovery room in satisfactory  condition.  <<PROCEDUREIMAGES>>  FINDINGS:  Extensive changes of chronic pancreatitis, with gland atrophy, pancreatic ductal dilatation, diffuse parenchymal calcification.  At junction of neck and body of pancreas, a well-defined round 42 x 46 mm cyst with intramural calcification was seen.  Gallbladder appears normal; I did not see any stones, pericholecystic fluid, or wall thickening.  No obvious pancreas mass was seen, but lesions could easily be missed in midst of such extensive chronic pancreatitis.  Normal-appearing bile duct. After completion of our diagnostic exam, the cyst was punctured with 22-g needle and about 50cc of thin brown fluid was aspirated and sent for analysis; cyst was aspirated to near-collapse.  ENDOSCOPIC IMPRESSION:    1.  Pancreatic cyst,  highly likely chronic pseudocyst, aspirated to near-collapse. 2.  Extensive changes of chronic pancreatitis. 3. Normal-appearing gallbladder.  RECOMMENDATIONS:      1.  Watch for potential complications of procedure. 2.  Await pancreatic cyst fluid analysis. 3   Cipro 500 mg bid x 5 days. 4.  Alcohol cessation. 5.  Follow-up in office in 3-4 weeks.  ______________________________ Willis Modena  CC:  n. eSIGNEDWillis Modena at 01/20/2012 10:20 AM  Ileana Roup, [phone]

## 2012-01-21 ENCOUNTER — Encounter (HOSPITAL_COMMUNITY): Payer: Self-pay | Admitting: Gastroenterology

## 2012-01-22 ENCOUNTER — Ambulatory Visit
Admission: RE | Admit: 2012-01-22 | Discharge: 2012-01-22 | Disposition: A | Discharge status: Home / Self Care | Payer: Medicare Other | Source: Ambulatory Visit | Attending: Gastroenterology | Admitting: Gastroenterology

## 2012-01-22 ENCOUNTER — Other Ambulatory Visit: Payer: Self-pay | Admitting: Gastroenterology

## 2012-01-22 DIAGNOSIS — R1011 Right upper quadrant pain: Secondary | ICD-10-CM

## 2012-03-10 ENCOUNTER — Other Ambulatory Visit (HOSPITAL_COMMUNITY): Payer: Self-pay | Admitting: Gastroenterology

## 2012-03-10 DIAGNOSIS — R1013 Epigastric pain: Secondary | ICD-10-CM

## 2012-03-17 ENCOUNTER — Encounter (HOSPITAL_COMMUNITY)
Admission: RE | Admit: 2012-03-17 | Discharge: 2012-03-17 | Disposition: A | Discharge status: Home / Self Care | Payer: Medicare Other | Source: Ambulatory Visit | Attending: Gastroenterology | Admitting: Gastroenterology

## 2012-03-17 DIAGNOSIS — R1013 Epigastric pain: Secondary | ICD-10-CM | POA: Insufficient documentation

## 2012-03-17 MED ORDER — SINCALIDE 5 MCG IJ SOLR
INTRAMUSCULAR | Status: AC
  Filled 2012-03-17: qty 5

## 2012-03-17 MED ORDER — TECHNETIUM TC 99M MEBROFENIN IV KIT
5.0000 | PACK | Freq: Once | INTRAVENOUS | Status: AC | PRN
  Administered 2012-03-17: 5 via INTRAVENOUS

## 2012-03-17 MED ORDER — SINCALIDE 5 MCG IJ SOLR
INTRAMUSCULAR | Status: AC | PRN
  Administered 2012-03-17: 1.116 ug via INTRAVENOUS

## 2012-03-17 NOTE — ED Notes (Signed)
Kinevac started per protacall

## 2012-03-17 NOTE — ED Notes (Signed)
Pt wt 123 lbs = 55.9 KG Kinevac dose:  0.02 mcg / kg =1.12 2.8 ml Kinevac added to 250 ml NS To infuse:  100 ml at 200 ml per hour

## 2012-03-23 ENCOUNTER — Ambulatory Visit (INDEPENDENT_AMBULATORY_CARE_PROVIDER_SITE_OTHER): Payer: Medicare Other | Admitting: General Surgery

## 2012-03-23 ENCOUNTER — Encounter (INDEPENDENT_AMBULATORY_CARE_PROVIDER_SITE_OTHER): Payer: Self-pay | Admitting: General Surgery

## 2012-03-23 ENCOUNTER — Telehealth (INDEPENDENT_AMBULATORY_CARE_PROVIDER_SITE_OTHER): Payer: Self-pay

## 2012-03-23 VITALS — BP 130/70 | HR 108 | Temp 97.0°F | Resp 14 | Ht 69.0 in | Wt 124.0 lb

## 2012-03-23 DIAGNOSIS — K828 Other specified diseases of gallbladder: Secondary | ICD-10-CM

## 2012-03-23 NOTE — Telephone Encounter (Signed)
Close encounter 

## 2012-03-23 NOTE — Patient Instructions (Signed)
We need to get medical clearance from Dr Tenny Craw prior to scheduling your surgery. We also need to see it we can hold your plavix prior to surgery. Once we have clearance, our office will contact you to schedule surgery. If you have not heard from our office in 1-2 weeks, please call us at [phone]

## 2012-03-27 NOTE — Progress Notes (Signed)
Patient ID: Mitchell Mccall, male   DOB: [birth year], 61 y.o.   MRN: [id number]  Chief Complaint  Patient presents with  . Abdominal Pain    HPI Mitchell Mccall is a 61 y.o. male.   HPI 61 year old Caucasian male referred by Dr. Dulce Sellar for consideration of cholecystectomy. 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 He was actually in the hospital back in March. During that hospitalization GI medicine as well as our surgical service was consulted because of his pain. At that time it was felt that cholecystectomy should be deferred. Since hospital discharge she is continued to have ongoing upper abdominal pain. His workup has been quite extensive. He has had an abdominal ultrasound, CT scan, and HIDA scan. He underwent an endoscopic ultrasound by Dr. Dulce Sellar which revealed chronic pancreatitis as well as a pancreatic pseudocyst. It was aspirated and drained. The cytology was benign. However the cyst recurred. He has ongoing pain. He describes it as a spasm in his upper abdomen. He feels like something is contracting inside his abdomen. The pain is generally after eating. It is associated with nausea. He will occasionally vomit. The spasm will last for several hours. It radiates into his right upper quadrant. He denies any acholic stools. There's been no particular food association. He has lost some weight up to about 9 pounds. He does have some heartburn. He denies any difficulty swallowing. He denies any NSAID use. He continues to smoke. He denies any melena, hematochezia, jaundice. He has daily bowel movement.He continues to drink several beers a day. He states that he drinks no more than 3 beers a day. He states he no longer drinks hard stuff.  Past Medical History  Diagnosis Date  . Peripheral vascular disease   . Diabetes mellitus   . GERD (gastroesophageal reflux disease)   . Seizures   . Stomach ulcer   . Hypertension   . Anxiety   .  Depression   . Hepatitis   . H/O hiatal hernia   . Anemia   . Blood transfusion   . Undescended testicle   . Pneumonia DEC 2003  . Colon polyps   . Chronic lower back pain   . Subdural hematoma JULY 2006  . Duodenitis   . Esophagitis   . Coarse tremors   . Diverticulosis   . Rib fracture DEC 2005    Right sided from a fall  . Rectal bleed JULY 2004  . Vitamin d deficiency   . Macrocytosis   . DTs (delirium tremens)   . Claudication in peripheral vascular disease   . Pancreatitis, alcoholic   . Fatty liver, alcoholic   . Hemorrhoids   . 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 Pancreatic pseudocyst   . Blood dyscrasia     macrocytosis    Past Surgical History  Procedure Date  . Vascular surgery   . Ligament repair 1955    left leg   . False aneurysm repair 09/16/2011    Procedure: REPAIR FALSE ANEURYSM;  Surgeon: Larina Earthly, MD;  Location: [facility] OR;  Service: Vascular;  Laterality: Left;  . Incision and drainage of wound   . Eus 01/20/2012    Procedure: ESOPHAGEAL ENDOSCOPIC ULTRASOUND (EUS) RADIAL;  Surgeon: Willis Modena, MD;  Location: [facility];  Service: Endoscopy;  Laterality: N/A;  . Fine needle aspiration 01/20/2012    Procedure: FINE NEEDLE ASPIRATION (FNA) LINEAR;  Surgeon: Willis Modena,  MD;  Location: [facility];  Service: Endoscopy;  Laterality: N/A;    Family History  Problem Relation Age of Onset  . Diabetes Father   . Coronary artery disease Mother     Social History History  Substance Use Topics  . Smoking status: Current Everyday Smoker -- 1.0 packs/day for 50 years    Types: Cigarettes  . Smokeless tobacco: Never Used  . Alcohol Use: 0.0 oz/week     6 pack of beer per week    No Known Allergies  Current Outpatient Prescriptions  Medication Sig Dispense Refill  . clopidogrel (PLAVIX) 75 MG tablet Take 75 mg by mouth daily.      . DULoxetine (CYMBALTA) 20 MG capsule Take 20 mg by mouth 2  (two) times daily.       . ergocalciferol (VITAMIN D2) 50000 UNITS capsule Take 50,000 Units by mouth once a week. SUNDAY      . gabapentin (NEURONTIN) 300 MG capsule Take 300 mg by mouth 3 (three) times daily.       . insulin glargine (LANTUS) 100 UNIT/ML injection Inject 50-60 Units into the skin daily. Depends on CBG      . l-methylfolate-B6-B12 (METANX) 3-35-2 MG TABS Take 1 tablet by mouth 2 (two) times daily.      . Multiple Vitamins-Minerals (MULTIVITAMINS THER. W/MINERALS) TABS Take 1 tablet by mouth daily.       . nitroGLYCERIN (NITROSTAT) 0.4 MG SL tablet Place 1 tablet (0.4 mg total) under the tongue every 5 (five) minutes x 3 doses as needed for chest pain (if needed for severe chest pain/pressure).  25 tablet  2  . oxyCODONE (OXY IR/ROXICODONE) 5 MG immediate release tablet Take 1 tablet (5 mg total) by mouth every 6 (six) hours as needed for pain. For pain  15 tablet  0  . Pancrelipase, Lip-Prot-Amyl, (ZENPEP) 25000 UNITS CPEP Take 1 capsule by mouth 3 (three) times daily before meals.      . pioglitazone (ACTOS) 30 MG tablet Take 30 mg by mouth daily.       . ursodiol (ACTIGALL) 300 MG capsule Take 1 capsule (300 mg total) by mouth 2 (two) times daily.  60 capsule  0  . vitamin B-12 (CYANOCOBALAMIN) 1000 MCG tablet Take 1,000 mcg by mouth daily.          Review of Systems Review of Systems  Constitutional: Positive for unexpected weight change (some wt loss <10 pounds). Negative for fever and appetite change.  HENT: Negative for hearing loss, nosebleeds and postnasal drip.   Eyes: Negative for photophobia and visual disturbance.       +glasses  Respiratory: Negative for shortness of breath.        Denies DOE, orthopnea, PND  Cardiovascular: Negative for chest pain and palpitations.  Gastrointestinal:       See hpi  Genitourinary: Positive for frequency and difficulty urinating.  Musculoskeletal:       +OA pains  Neurological: Positive for light-headedness. Negative for  seizures (none recently).       Denies TIAs, amaurosis fugax; has had 2 episodes of what he describes as losing his balance. "my right leg just went out". Fall. +lightheadedness - feels like I'm going to pass out  Hematological: Negative for adenopathy.       On plavix  Psychiatric/Behavioral: Negative for suicidal ideas.    Blood pressure 130/70, pulse 108, temperature 97 F (36.1 C), temperature source Temporal, resp. rate 14, height 5\' 9"  (1.753 m), weight 124  lb (56.246 kg).  Physical Exam Physical Exam  Vitals reviewed. Constitutional: He is oriented to person, place, and time. He appears well-developed and well-nourished. No distress.  HENT:  Head: Normocephalic and atraumatic.  Right Ear: External ear normal.  Left Ear: External ear normal.  Eyes: Conjunctivae are normal. No scleral icterus.  Neck: Normal range of motion. Neck supple. No tracheal deviation present.  Cardiovascular: Normal rate, regular rhythm and normal heart sounds.   No murmur heard. Pulmonary/Chest: Effort normal and breath sounds normal. No respiratory distress. He has no wheezes.  Abdominal: Soft. Bowel sounds are normal. He exhibits no distension. There is tenderness (very mild TTP RUQ/epigastric).  Musculoskeletal: Normal range of motion. He exhibits no edema and no tenderness.  Neurological: He is alert and oriented to person, place, and time. No cranial nerve deficit. He exhibits normal muscle tone.  Skin: Skin is warm and dry. He is not diaphoretic.  Psychiatric: He has a normal mood and affect. His behavior is normal. Judgment normal.    Data Reviewed Dr Hulen Shouts notes from 12/2011, 02/2012 (along with labs in that visit glucose 359, Na 131, Cl 91, lfts ok, wbc 12 Hospital Discharge Summary 10/2011 EUS report - aspiration of panc cyst - negative cytology  RADIOLOGICAL STUDIES: I have personally reviewed the radiological exams myself  COMPLETE ABDOMINAL ULTRASOUND  10/2011 Comparison: CT abdomen  pelvis of 05/01/2010  Findings:   Gallbladder: Small nonshadowing gallstones are present within the  gallbladder. There is no pain of the gallbladder with compression.   Common bile duct: The common bile duct is prominent measuring 10.9  mm in diameter although no definite calculus is evident.   Liver: The liver has a normal echogenic pattern. No ductal  dilatation is seen.  IVC: Appears normal.   Pancreas: There is a complex cystic structure in the neck -  proximal body of the pancreas measuring 5.6 x 4.1 x 5.6 cm  consistent with pancreatic pseudocyst with calcifications. The  more distal pancreas is difficult to evaluate due to bowel gas.   Spleen: The spleen is normal measuring 5.6 cm sagittally.  Right Kidney: No hydronephrosis is seen. The right kidney  measures 12.4 cm sagittally.  Left Kidney: No hydronephrosis is noted. The left kidney measures  10.6 cm. A small cyst is present in the lower pole of 0.9 mm in  diameter.  Abdominal aorta: The aorta is partially obscured by bowel gas. No  aneurysm is evident.   IMPRESSION:  1. Probable pancreatic pseudocyst in the neck - proximal body of  5.6 cm.  2. Small nonshadowing gallstones. No pain over the gallbladder.  3. Prominent common bile duct. No definite common bile duct  calculus. Correlate with LFTs.  CT ABDOMEN AND PELVIS WITH CONTRAST 10/2011 Technique: Multidetector CT imaging of the abdomen and pelvis was  performed following the standard protocol during bolus  administration of intravenous contrast.  Contrast: 80mL OMNIPAQUE IOHEXOL 300 MG/ML IJ SOLN  Comparison: Ultrasound abdomen 11/11/2011 and MR abdomen  05/02/2010. CT abdomen pelvis 05/01/2010.   Findings: Lung bases show no acute findings. Heart size normal.  No pericardial or pleural effusion. There may be mild thickening  of the distal esophageal wall, which can be seen with  gastroesophageal reflux disease.  A 10 mm hyperattenuating lesion is seen in  the right hepatic lobe,  stable from prior exams. Liver and gallbladder are otherwise  unremarkable. Mild nodular thickening of the adrenal glands,  stable. Right kidney is unremarkable. Sub centimeter low  attenuation lesions in the left kidney are unchanged. Spleen is  unremarkable.  Calcifications are seen in the pancreas with mild pancreatic ductal  prominence. A low attenuation lesion in the pancreatic neck  measures 3.5 x 3.8 cm (previously 3.2 x 2.9 cm). No peripancreatic  inflammation.   Stomach and bowel are unremarkable.   There is a complex lesion in the left groin, which is predominantly  low in attenuation, measuring 1.8 x 2.6 cm, new from the  05/01/2010. No pathologically enlarged intra-abdominal or  intrapelvic lymph nodes. Atherosclerotic calcification of the  arterial vasculature without abdominal aortic aneurysm. No free  fluid.  No worrisome lytic or sclerotic lesions. Sclerosis is seen in the  femoral heads, left greater than right.   IMPRESSION:  1. Chronic calcific pancreatitis with interval enlargement of a  pseudocyst in the pancreatic neck. No evidence of acute  pancreatitis.  2. New complex nodular lesion in the left groin. Question recent  surgery. A necrotic lymph node cannot be definitively excluded.  3. Stable hyperattenuating lesion in the right hepatic lobe,  previously suggested to represent focal nodular hyperplasia.  4. Avascular necrosis of the femoral heads, left greater than  right.  NUCLEAR MEDICINE HEPATOBILIARY IMAGING WITH GALLBLADDER EF 02/2012  Technique: Sequential images of the abdomen were obtained out to 60  minutes following intravenous administration of  radiopharmaceutical. After slow intravenous infusion of 1.1 ucg  Cholecystokinin, gallbladder ejection fraction was determined.  Radiopharmaceutical: 5.0 mCi Tc-65m Choletec  Comparison: CT of 11/11/2011   Findings: There is prompt extraction of radiotracer from the blood    pool and homogeneous uptake within the liver. The gallbladder is  evident by 20 minutes. Counts are present within the bowel by 15  minutes. Upon administration of a cholecystokinin, the gallbladder  contracts appropriately with a calculated ejection fraction = 13%  at 30 min (Normal greater than 30 % ejection).   IMPRESSION:  1. Low gallbladder ejection fraction = 13 %. Common differential  diagnosis includes biliary dyskinesia, chronic cholecystitis, and  sphincter Oddi dysfunction.  2. Patent cystic duct and common bile duct.  Assessment    Biliary dyskinesia Gallstones Chronic pancreatitis Pancreatic pseudocyst Tobacco abuse DM PVD    Plan    I do believe a component of his abdominal pain could be due to biliary dyskinesia. He does have a positive HIDA scan. However he does have chronic pancreatitis which can cause a fair amount of abdominal pain.  We discussed gallbladder disease. The patient was given Agricultural engineer. We discussed non-operative and operative management. We discussed the signs & symptoms of acute cholecystitis  I discussed laparoscopic cholecystectomy with IOC in detail.  The patient was given educational material as well as diagrams detailing the procedure.  We discussed the risks and benefits of a laparoscopic cholecystectomy including, but not limited to bleeding, infection, injury to surrounding structures such as the intestine or liver, bile leak, retained gallstones, need to convert to an open procedure, prolonged diarrhea, blood clots such as  DVT, common bile duct injury, anesthesia risks, and possible need for additional procedures.  We discussed the typical post-operative recovery course. I explained that the likelihood of improvement of their symptoms is around 60%.  The patient would like to proceed with surgery; however, I am very concerned about his most recent episodes of falling. He describes it as a sensation that his legs give out on him.  I told him I would not schedule him for surgery until he has medical clearance. He  states he is scheduled to see his primary care physician this coming Friday. I would also like permission to hold his Plavix perioperatively. Once we receive medical clearance, we will schedule him for laparoscopic cholecystectomy with cholangiogram  Total consult time including reviewing his records and coordinating his care was approximately 60 minutes  Mary Sella. Andrey Campanile, MD, FACS General, Bariatric, & Minimally Invasive Surgery [facility] Surgery, [place]        [facility] M 03/27/2012, 8:51 AM

## 2012-04-01 ENCOUNTER — Telehealth (INDEPENDENT_AMBULATORY_CARE_PROVIDER_SITE_OTHER): Payer: Self-pay | Admitting: General Surgery

## 2012-04-01 NOTE — Telephone Encounter (Signed)
Received clearance from Dr Hazle Coca nurse stating patient is not on Plavix. I called and spoke with her and they have no record of patient being on Plavix. I spoke with patient's wife. She states prescriber on RX is Dr Diona Fanti but he was released from their office to follow up with Dr Allyson Sabal. I advised her to make an appt with Dr Allyson Sabal to get it addressed whether patient needs to remain on Plavix. They will call me back once it is addressed whether he can stay off Plavix for surgery.

## 2012-04-05 ENCOUNTER — Encounter (INDEPENDENT_AMBULATORY_CARE_PROVIDER_SITE_OTHER): Payer: Self-pay

## 2012-04-22 ENCOUNTER — Encounter (HOSPITAL_COMMUNITY): Payer: Self-pay

## 2012-04-29 ENCOUNTER — Telehealth (INDEPENDENT_AMBULATORY_CARE_PROVIDER_SITE_OTHER): Payer: Self-pay | Admitting: General Surgery

## 2012-04-29 ENCOUNTER — Encounter (HOSPITAL_COMMUNITY)
Admission: RE | Admit: 2012-04-29 | Discharge: 2012-04-29 | Disposition: A | Discharge status: Home / Self Care | Payer: Medicare Other | Source: Ambulatory Visit | Attending: General Surgery | Admitting: General Surgery

## 2012-04-29 ENCOUNTER — Encounter (HOSPITAL_COMMUNITY): Payer: Self-pay

## 2012-04-29 ENCOUNTER — Other Ambulatory Visit (INDEPENDENT_AMBULATORY_CARE_PROVIDER_SITE_OTHER): Payer: Self-pay | Admitting: General Surgery

## 2012-04-29 LAB — CBC WITH DIFFERENTIAL/PLATELET
Basophils Absolute: 0 10*3/uL (ref 0.0–0.1)
Basophils Relative: 1 % (ref 0–1)
Eosinophils Relative: 0 % (ref 0–5)
HCT: 42.8 % (ref 39.0–52.0)
Hemoglobin: 15.3 g/dL (ref 13.0–17.0)
MCH: 36.2 pg — ABNORMAL HIGH (ref 26.0–34.0)
MCHC: 35.7 g/dL (ref 30.0–36.0)
MCV: 101.2 fL — ABNORMAL HIGH (ref 78.0–100.0)
Monocytes Absolute: 0.7 10*3/uL (ref 0.1–1.0)
Monocytes Relative: 8 % (ref 3–12)
RDW: 13 % (ref 11.5–15.5)

## 2012-04-29 LAB — COMPREHENSIVE METABOLIC PANEL
AST: 18 U/L (ref 0–37)
Albumin: 4 g/dL (ref 3.5–5.2)
BUN: 9 mg/dL (ref 6–23)
CO2: 27 mEq/L (ref 19–32)
Calcium: 9.4 mg/dL (ref 8.4–10.5)
Chloride: 88 mEq/L — ABNORMAL LOW (ref 96–112)
Creatinine, Ser: 0.56 mg/dL (ref 0.50–1.35)
GFR calc non Af Amer: >90 mL/min (ref >90)
Total Bilirubin: 0.2 mg/dL — ABNORMAL LOW (ref 0.3–1.2)

## 2012-04-29 LAB — PROTIME-INR
INR: 0.97 (ref 0.00–1.49)
Prothrombin Time: 13.1 seconds (ref 11.6–15.2)

## 2012-04-29 MED ORDER — CHLORHEXIDINE GLUCONATE 4 % EX LIQD: 1.0000 "application " | Freq: Once | CUTANEOUS | Status: DC

## 2012-04-29 NOTE — Progress Notes (Addendum)
req'd card notes, tests from sehv   Dr Andrey Campanile office called re: elevated glucose on todays labs 445 at 1430. Britta Mccreedy to give him heads up.

## 2012-04-29 NOTE — Pre-Procedure Instructions (Addendum)
20 Mitchell Mccall   04/29/2012   Your procedure is scheduled on: September 13th, Friday  Report to [name] at  7:30 AM.              Surgery time posted from 9:30 Am to 11:00 Am  Call this number if you have problems the morning of surgery: [phone]   Remember:   Do not eat food or drink any fluids:After Midnight Thursday.    Take these medicines the morning of surgery with A SIP OF WATER: Cymbalta, Gabapentin,                Oxycodone (if needed)   Do not wear jewelry.  Do not wear lotions, powders, or cologne. You may  wear deodorant.  Do not shave 48 hours prior to surgery. (This applies to women only)   Do not bring valuables to the hospital.  Contacts, dentures or bridgework may not be worn into surgery.   Leave suitcase in the car. After surgery it may be brought to your room.  For patients admitted to the hospital, checkout time is 11:00 AM the day of discharge.   Patients discharged the day of surgery will not be allowed to drive home.   Name and phone number of your driver: [name] [phone]    Special Instructions: CHG Shower Use Special Wash: 1/2 bottle night before surgery and 1/2 bottle morning of surgery.   Please read over the following fact sheets that you were given: Pain Booklet, Coughing and Deep Breathing, MRSA Information and Surgical Site Infection Prevention

## 2012-04-29 NOTE — Telephone Encounter (Signed)
DEBBIE FROM [facility] CALLED TO REQUEST ORDERS BE ENTERED IN EPIC FOR PT SCHYLER COUNSELL / SURGERY 05-06-12/ PH- [phone]/GY

## 2012-04-29 NOTE — Progress Notes (Signed)
10:10 Am....have called office again for orders from Dr. Andrey Campanile on this patient.  Alcario Drought, Geographical information systems officer, called yesterday)  DA

## 2012-05-04 ENCOUNTER — Emergency Department (HOSPITAL_COMMUNITY): Payer: Medicare Other

## 2012-05-04 ENCOUNTER — Emergency Department (HOSPITAL_COMMUNITY)
Admission: EM | Admit: 2012-05-04 | Discharge: 2012-05-04 | Disposition: A | Discharge status: Home / Self Care | Payer: Medicare Other | Attending: Emergency Medicine | Admitting: Emergency Medicine

## 2012-05-04 ENCOUNTER — Encounter (HOSPITAL_COMMUNITY): Payer: Self-pay | Admitting: Emergency Medicine

## 2012-05-04 DIAGNOSIS — M25559 Pain in unspecified hip: Secondary | ICD-10-CM | POA: Insufficient documentation

## 2012-05-04 DIAGNOSIS — I10 Essential (primary) hypertension: Secondary | ICD-10-CM | POA: Insufficient documentation

## 2012-05-04 DIAGNOSIS — R109 Unspecified abdominal pain: Secondary | ICD-10-CM

## 2012-05-04 DIAGNOSIS — F341 Dysthymic disorder: Secondary | ICD-10-CM | POA: Insufficient documentation

## 2012-05-04 DIAGNOSIS — K829 Disease of gallbladder, unspecified: Secondary | ICD-10-CM

## 2012-05-04 DIAGNOSIS — G8929 Other chronic pain: Secondary | ICD-10-CM | POA: Insufficient documentation

## 2012-05-04 DIAGNOSIS — E119 Type 2 diabetes mellitus without complications: Secondary | ICD-10-CM | POA: Insufficient documentation

## 2012-05-04 DIAGNOSIS — M25569 Pain in unspecified knee: Secondary | ICD-10-CM | POA: Insufficient documentation

## 2012-05-04 DIAGNOSIS — Z7982 Long term (current) use of aspirin: Secondary | ICD-10-CM | POA: Insufficient documentation

## 2012-05-04 DIAGNOSIS — W19XXXA Unspecified fall, initial encounter: Secondary | ICD-10-CM | POA: Insufficient documentation

## 2012-05-04 DIAGNOSIS — Z79899 Other long term (current) drug therapy: Secondary | ICD-10-CM | POA: Insufficient documentation

## 2012-05-04 DIAGNOSIS — F17200 Nicotine dependence, unspecified, uncomplicated: Secondary | ICD-10-CM | POA: Insufficient documentation

## 2012-05-04 LAB — POCT I-STAT TROPONIN I: Troponin i, poc: 0.01 ng/mL (ref 0.00–0.08)

## 2012-05-04 LAB — LIPASE, BLOOD: Lipase: 60 U/L — ABNORMAL HIGH (ref 11–59)

## 2012-05-04 LAB — CBC WITH DIFFERENTIAL/PLATELET
Basophils Absolute: 0 10*3/uL (ref 0.0–0.1)
Basophils Relative: 0 % (ref 0–1)
HCT: 45.8 % (ref 39.0–52.0)
Lymphocytes Relative: 37 % (ref 12–46)
MCHC: 35.4 g/dL (ref 30.0–36.0)
Neutro Abs: 6.2 10*3/uL (ref 1.7–7.7)
Neutrophils Relative %: 56 % (ref 43–77)
RDW: 13.3 % (ref 11.5–15.5)
WBC: 11.2 10*3/uL — ABNORMAL HIGH (ref 4.0–10.5)

## 2012-05-04 LAB — GLUCOSE, CAPILLARY

## 2012-05-04 LAB — COMPREHENSIVE METABOLIC PANEL
ALT: 19 U/L (ref 0–53)
AST: 36 U/L (ref 0–37)
Albumin: 4.1 g/dL (ref 3.5–5.2)
Alkaline Phosphatase: 59 U/L (ref 39–117)
CO2: 24 mEq/L (ref 19–32)
Chloride: 93 mEq/L — ABNORMAL LOW (ref 96–112)
GFR calc non Af Amer: >90 mL/min (ref >90)
Potassium: 3.8 mEq/L (ref 3.5–5.1)
Sodium: 139 mEq/L (ref 135–145)
Total Bilirubin: 0.3 mg/dL (ref 0.3–1.2)

## 2012-05-04 MED ORDER — SODIUM CHLORIDE 0.9 % IV BOLUS (SEPSIS)
1000.0000 mL | Freq: Once | INTRAVENOUS | Status: AC
  Administered 2012-05-04: 1000 mL via INTRAVENOUS

## 2012-05-04 MED ORDER — LORAZEPAM 2 MG/ML IJ SOLN
2.0000 mg | Freq: Once | INTRAMUSCULAR | Status: AC
  Administered 2012-05-04: 2 mg via INTRAVENOUS
  Filled 2012-05-04: qty 1

## 2012-05-04 MED ORDER — MORPHINE SULFATE 4 MG/ML IJ SOLN
4.0000 mg | Freq: Once | INTRAMUSCULAR | Status: AC
  Administered 2012-05-04: 4 mg via INTRAVENOUS
  Filled 2012-05-04: qty 1

## 2012-05-04 MED ORDER — ONDANSETRON 4 MG PO TBDP: 4.0000 mg | ORAL_TABLET | Freq: Three times a day (TID) | ORAL | Status: AC | PRN

## 2012-05-04 MED ORDER — ONDANSETRON HCL 4 MG/2ML IJ SOLN
4.0000 mg | INTRAMUSCULAR | Status: AC
  Administered 2012-05-04: 4 mg via INTRAVENOUS
  Filled 2012-05-04: qty 2

## 2012-05-04 MED ORDER — HYDROCODONE-ACETAMINOPHEN 5-325 MG PO TABS: 2.0000 | ORAL_TABLET | ORAL | Status: AC | PRN

## 2012-05-04 NOTE — ED Notes (Signed)
Pt CBG was 229. Rn notified, documented in pt chart as well.

## 2012-05-04 NOTE — ED Notes (Addendum)
Per EMS - pt c/o RUQ and LUQ abdominal pain 10/10. Pt reports the pain is intermittent. Pt is suupposed to have his gallbladder removed on Friday. Pt has large dark purple bruise on left shoulder, pt reports he fell yesterday when he was walking the dogs. Pt has DM.

## 2012-05-04 NOTE — ED Provider Notes (Signed)
History     CSN: [id number]  Arrival date & time 05/04/12  1729   First MD Initiated Contact with Patient 05/04/12 1740      Chief Complaint  Patient presents with  . Abdominal Pain    (Consider location/radiation/quality/duration/timing/severity/associated sxs/prior treatment) Patient is a 60 y.o. male presenting with abdominal pain. The history is provided by the patient, a relative and medical records.  Abdominal Pain The primary symptoms of the illness include abdominal pain, fever, nausea, vomiting, diarrhea and dysuria. The primary symptoms of the illness do not include fatigue or shortness of breath.  The dysuria is not associated with hematuria, frequency or urgency.  Additional symptoms associated with the illness include chills. Symptoms associated with the illness do not include diaphoresis, constipation, urgency, hematuria, frequency or back pain.   Mitchell Mccall is a 61 y.o. male presents to the emergency department complaining of abdominal pain.  The onset of the symptoms was  gradual starting many weeks ago.  The patient has associated right upper quadrant abdominal pain, vomiting, diarrhea, nausea, dysuria, cough, headache, fever.  The symptoms have been  persistent, gradually worsened since Sunday.  Nothing makes the symptoms worse and nothing makes symptoms better.  The patient denies chest pain, shortness of breath, loss of consciousness, dizziness.   Patient states many week history of right upper quadrant abdominal pain. Patient diagnosed with gallbladder disease. States he is to have his gallbladder removed on Friday by Dr. Andrey Campanile in general surgery.  He states symptoms got worse on Sunday with an increase of weakness and a decrease in his ability to tolerate by mouth foods. He states that he does not eat because it causes pain vomiting and nausea. He states decreased in level of consciousness and increase in weakness and confusion since Sunday.  Patient states yesterday he  now after tripping on the dog. He is having left knee pain left hip pain and left elbow pain.  History of diabetes with minimal by mouth intake recently. The patient states his sugars run in the 400s generally.  Patient also states he had his preop visit on Friday and all was well then.  Is alert and oriented x4 with coherent conversation.  Approximately 3/4 of the way through the interview the patient had what appeared to be a petite mall seizure.  The family and the random called it "an episode". She states that he has had these in the past with neurological evaluation and no cause found. She states been greater than one year since he last had an episode. Episode lasts approximately 3-5 minutes. Patient unable to talk during the episode but continues to breathe. Patient tachycardic to the 140s afterwards.  Post ictal period of about 3 minutes post seizure.  Afterwards the patient states he's had a few episodes today.  Patient given 2 mg of Ativan at that time.      Past Medical History  Diagnosis Date  . Peripheral vascular disease   . Diabetes mellitus   . GERD (gastroesophageal reflux disease)   . Stomach ulcer   . Hypertension   . Anxiety   . Depression   . Hepatitis   . H/O hiatal hernia   . Anemia   . Blood transfusion   . Undescended testicle   . Pneumonia DEC 2003  . Colon polyps   . Chronic lower back pain   . Subdural hematoma JULY 2006  . Duodenitis   . Esophagitis   . Coarse tremors   . Diverticulosis   .  Rib fracture DEC 2005    Right sided from a fall  . Rectal bleed JULY 2004  . Vitamin d deficiency   . Macrocytosis   . DTs (delirium tremens)   . Claudication in peripheral vascular disease   . Pancreatitis, alcoholic   . Fatty liver, alcoholic   . Hemorrhoids   . 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 Pancreatic pseudocyst   . Blood dyscrasia     macrocytosis  . Seizures     1-2 yrs ago ? cause    Past  Surgical History  Procedure Date  . Vascular surgery   . Ligament repair 1955    left leg   . False aneurysm repair 09/16/2011    Procedure: REPAIR FALSE ANEURYSM;  Surgeon: Larina Earthly, MD;  Location: [facility] OR;  Service: Vascular;  Laterality: Left;  . Incision and drainage of wound     groin  . Eus 01/20/2012    Procedure: ESOPHAGEAL ENDOSCOPIC ULTRASOUND (EUS) RADIAL;  Surgeon: Willis Modena, MD;  Location: [facility] ENDOSCOPY;  Service: Endoscopy;  Laterality: N/A;  . Fine needle aspiration 01/20/2012    Procedure: FINE NEEDLE ASPIRATION (FNA) LINEAR;  Surgeon: Willis Modena, MD;  Location: [facility] ENDOSCOPY;  Service: Endoscopy;  Laterality: N/A;    Family History  Problem Relation Age of Onset  . Diabetes Father   . Coronary artery disease Mother     History  Substance Use Topics  . Smoking status: Current Every Day Smoker -- 1.0 packs/day for 50 years    Types: Cigarettes  . Smokeless tobacco: Never Used  . Alcohol Use: 7.2 oz/week    12 Cans of beer per week     6 pack of beer per week      Review of Systems  Constitutional: Positive for fever, chills, activity change and appetite change. Negative for diaphoresis, fatigue and unexpected weight change.  HENT: Negative for mouth sores, neck pain and neck stiffness.   Eyes: Negative for visual disturbance.  Respiratory: Negative for cough, chest tightness, shortness of breath and wheezing.   Cardiovascular: Negative for chest pain.  Gastrointestinal: Positive for nausea, vomiting, abdominal pain and diarrhea. Negative for constipation.  Genitourinary: Positive for dysuria. Negative for urgency, frequency, hematuria and decreased urine volume.  Musculoskeletal: Negative for back pain and gait problem.  Skin: Negative for rash.  Neurological: Positive for seizures, weakness and headaches. Negative for syncope and light-headedness.  Psychiatric/Behavioral: Negative for disturbed wake/sleep cycle. The patient is not nervous/anxious.      Allergies  Review of patient's allergies indicates no known allergies.  Home Medications   Current Outpatient Rx  Name Route Sig Dispense Refill  . ASPIRIN 81 MG PO TABS Oral Take 81 mg by mouth daily.    Marland Kitchen CLOPIDOGREL BISULFATE 75 MG PO TABS Oral Take 75 mg by mouth daily.    . DULOXETINE HCL 20 MG PO CPEP Oral Take 20 mg by mouth 2 (two) times daily.     . ERGOCALCIFEROL 50000 UNITS PO CAPS Oral Take 50,000 Units by mouth once a week. SUNDAY    . GABAPENTIN 300 MG PO CAPS Oral Take 300 mg by mouth 2 (two) times daily.     . INSULIN DETEMIR 100 UNIT/ML Mitchell SOLN Subcutaneous Inject 30 Units into the skin 2 (two) times daily.    . L-METHYLFOLATE-B6-B12 3-35-2 MG PO TABS Oral Take 1 tablet by mouth 2 (two) times daily.    Carma Leaven M PLUS PO TABS  Oral Take 1 tablet by mouth daily.     Marland Kitchen NITROGLYCERIN 0.4 MG SL SUBL Sublingual Place 1 tablet (0.4 mg total) under the tongue every 5 (five) minutes x 3 doses as needed for chest pain (if needed for severe chest pain/pressure). 25 tablet 2  . OXYCODONE HCL 5 MG PO TABS Oral Take 1 tablet (5 mg total) by mouth every 6 (six) hours as needed for pain. For pain 15 tablet 0  . PANCRELIPASE (LIP-PROT-AMYL) 25000 UNITS PO CPEP Oral Take 1-2 capsules by mouth 4 (four) times daily. 2 capsules with each meal and 1 capsule with a snack    . PIOGLITAZONE HCL 30 MG PO TABS Oral Take 30 mg by mouth daily.     Marland Kitchen HYDROCODONE-ACETAMINOPHEN 5-325 MG PO TABS Oral Take 2 tablets by mouth every 4 (four) hours as needed for pain. 10 tablet 0  . ONDANSETRON 4 MG PO TBDP Oral Take 1 tablet (4 mg total) by mouth every 8 (eight) hours as needed for nausea. 10 tablet 0    BP 155/59  Pulse 113  Resp 19  SpO2 96%  Physical Exam  Nursing note and vitals reviewed. Constitutional: He appears well-developed and well-nourished. No distress.  HENT:  Head: Normocephalic and atraumatic.  Mouth/Throat: Uvula is midline, oropharynx is clear and moist and mucous membranes are  normal. No oropharyngeal exudate.  Eyes: Conjunctivae normal and EOM are normal. Pupils are equal, round, and reactive to light. No scleral icterus.  Neck: Normal range of motion. Neck supple.  Cardiovascular: Normal rate, regular rhythm, normal heart sounds and intact distal pulses.  Exam reveals no gallop and no friction rub.   No murmur heard. Pulmonary/Chest: Effort normal and breath sounds normal. No respiratory distress. He has no wheezes.  Abdominal: Soft. Bowel sounds are normal. He exhibits no mass. There is tenderness (RUQ). There is guarding. There is no rebound.  Musculoskeletal: Normal range of motion. He exhibits no edema and no tenderness.  Lymphadenopathy:    He has no cervical adenopathy.  Neurological: He is alert.       Speech is clear and goal oriented Moves extremities without ataxia  Questionable petite mall seizure activity witnessed - episode of decreased LOC, staring, muscle rigidity, and garbled speech sounds.  Skin: Skin is warm and dry. No rash noted. He is not diaphoretic.  Psychiatric: He has a normal mood and affect.    ED Course  Procedures (including critical care time)  Labs Reviewed  CBC WITH DIFFERENTIAL - Abnormal; Notable for the following:    WBC 11.2 (*)     MCV 100.9 (*)     MCH 35.7 (*)     Lymphs Abs 4.2 (*)     All other components within normal limits  COMPREHENSIVE METABOLIC PANEL - Abnormal; Notable for the following:    Chloride 93 (*)     Glucose, Bld 234 (*)     Creatinine, Ser 0.48 (*)     All other components within normal limits  LIPASE, BLOOD - Abnormal; Notable for the following:    Lipase 60 (*)     All other components within normal limits  GLUCOSE, CAPILLARY - Abnormal; Notable for the following:    Glucose-Capillary 229 (*)     All other components within normal limits  POCT I-STAT TROPONIN I  URINALYSIS, ROUTINE W REFLEX MICROSCOPIC   Dg Chest 2 View  05/04/2012  *RADIOLOGY REPORT*  Clinical Data: Short of breath,  fell yesterday  CHEST - 2  VIEW  Comparison: Prior chest x-ray 11/11/2011  Findings: Similar appearance of slight hyperinflation of the lungs. Negative for edema, focal airspace consolidation, pneumothorax, or pleural effusion.  Cardiac and mediastinal contours within normal limits.  Trace atherosclerotic calcifications in the aorta.  No acute osseous abnormality.  IMPRESSION:  1.  No acute cardiopulmonary disease. 2.  Stable mild hyperinflation 3.  Trace aortic atherosclerotic calcifications   Original Report Authenticated By: Alvino Blood Elbow Complete Left  05/04/2012  *RADIOLOGY REPORT*  Clinical Data: Fall yesterday left elbow, the and hip pain  LEFT ELBOW - COMPLETE 3+ VIEW  Comparison: None.  Findings: No acute fracture, malalignment or elbow joint effusion. No focal soft tissue swelling.  IMPRESSION: Negative radiographs of the left elbow   Original Report Authenticated By: Alvino Blood Hip Complete Left  05/04/2012  *RADIOLOGY REPORT*  Clinical Data: Fall yesterday, left hip pain  LEFT HIP - COMPLETE 2+ VIEW  Comparison: Concurrently obtained radiographs of the left knee  Findings: The bones appear mildly osteopenic.  No acute fracture, or malalignment.  The femoral head remains located within the acetabulum.  The bony pelvis is intact.  Left common iliac arterial stent.  IMPRESSION: No acute fracture, or malalignment.   Original Report Authenticated By: Vilma Prader    US Abdomen Complete  05/04/2012  *RADIOLOGY REPORT*  Clinical Data:  Cholelithiasis.  Clinical concern for choledocholithiasis.  COMPLETE ABDOMINAL ULTRASOUND  Comparison:  Ultrasound dated 11/11/2011 and CT dated 11/11/2011.  Findings:  Gallbladder:  Small number of small gallstones in the gallbladder. The largest measures 3 mm in maximum diameter.  No gallbladder wall thickening or pericholecystic fluid.  The patient was not tender over the gallbladder.  Common bile duct:  Dilated, measuring 8.9 mm in diameter proximally.  There is a  suggestion of soft tissue type material in the distal common duct with no visible calculi.  Liver:  Mildly echogenic.  Corresponding to minimal decreased density on the previous CT.  IVC:  Normal proximally.  Pancreas:  Again demonstrated is a cystic structure in the proximal body of the pancreas, measuring 4.8 x 4.7 x 2.9 cm in maximum dimensions.  The remainder of the pancreas is poorly visualized and contained extensive dense calcifications on the previous CT.  Spleen:  Normal, measuring 3.1 cm in length.  Right Kidney:  Normal, measuring 11.1 cm in length.  Left Kidney:  6 mm cyst in the midportion of the kidney. Otherwise, normal, measuring 11.1 cm in length.  Abdominal aorta:  No aneurysm identified.  IMPRESSION:  1.  Small number of small gallstones in the gallbladder without evidence of cholecystitis. 2.  Dilated common duct with possible sludge and small stones in the distal common duct. 3.  Mild increase in size of the previously demonstrated pancreatic pseudocyst. 4.  Mild hepatic steatosis.   Original Report Authenticated By: Darrol Angel, M.D.    Dg Knee Complete 4 Views Left  05/04/2012  *RADIOLOGY REPORT*  Clinical Data: Left knee pain, fell while walking do not yesterday  LEFT KNEE - COMPLETE 4+ VIEW  Comparison: None.  Findings: The bones are mildly osteopenic.  No acute fracture, malalignment or knee joint effusion.  No significant degenerative changes.  Mottled appearance of the patella favored to represent osteopenia.  Scattered atherosclerotic vascular calcifications.  IMPRESSION:  No acute fracture, malalignment or joint effusion.  Osteopenia   Original Report Authenticated By: Vilma Prader    Results for orders placed during the hospital encounter of 05/04/12  CBC WITH DIFFERENTIAL      Component Value Range   WBC 11.2 (*) 4.0 - 10.5 K/uL   RBC 4.54  4.22 - 5.81 MIL/uL   Hemoglobin 16.2  13.0 - 17.0 g/dL   HCT 96.2  95.2 - 84.1 %   MCV 100.9 (*) 78.0 - 100.0 fL   MCH 35.7 (*) 26.0 -  34.0 pg   MCHC 35.4  30.0 - 36.0 g/dL   RDW 32.4  40.1 - 02.7 %   Platelets 232  150 - 400 K/uL   Neutrophils Relative 56  43 - 77 %   Neutro Abs 6.2  1.7 - 7.7 K/uL   Lymphocytes Relative 37  12 - 46 %   Lymphs Abs 4.2 (*) 0.7 - 4.0 K/uL   Monocytes Relative 6  3 - 12 %   Monocytes Absolute 0.7  0.1 - 1.0 K/uL   Eosinophils Relative 1  0 - 5 %   Eosinophils Absolute 0.1  0.0 - 0.7 K/uL   Basophils Relative 0  0 - 1 %   Basophils Absolute 0.0  0.0 - 0.1 K/uL  COMPREHENSIVE METABOLIC PANEL      Component Value Range   Sodium 139  135 - 145 mEq/L   Potassium 3.8  3.5 - 5.1 mEq/L   Chloride 93 (*) 96 - 112 mEq/L   CO2 24  19 - 32 mEq/L   Glucose, Bld 234 (*) 70 - 99 mg/dL   BUN 10  6 - 23 mg/dL   Creatinine, Ser 2.53 (*) 0.50 - 1.35 mg/dL   Calcium 9.4  8.4 - 66.4 mg/dL   Total Protein 7.6  6.0 - 8.3 g/dL   Albumin 4.1  3.5 - 5.2 g/dL   AST 36  0 - 37 U/L   ALT 19  0 - 53 U/L   Alkaline Phosphatase 59  39 - 117 U/L   Total Bilirubin 0.3  0.3 - 1.2 mg/dL   GFR calc non Af Amer >90  >90 mL/min   GFR calc Af Amer >90  >90 mL/min  LIPASE, BLOOD      Component Value Range   Lipase 60 (*) 11 - 59 U/L  GLUCOSE, CAPILLARY      Component Value Range   Glucose-Capillary 229 (*) 70 - 99 mg/dL   Comment 1 Documented in Chart     Comment 2 Notify RN    POCT I-STAT TROPONIN I      Component Value Range   Troponin i, poc 0.01  0.00 - 0.08 ng/mL   Comment 3            Dg Chest 2 View  05/04/2012  *RADIOLOGY REPORT*  Clinical Data: Short of breath, fell yesterday  CHEST - 2 VIEW  Comparison: Prior chest x-ray 11/11/2011  Findings: Similar appearance of slight hyperinflation of the lungs. Negative for edema, focal airspace consolidation, pneumothorax, or pleural effusion.  Cardiac and mediastinal contours within normal limits.  Trace atherosclerotic calcifications in the aorta.  No acute osseous abnormality.  IMPRESSION:  1.  No acute cardiopulmonary disease. 2.  Stable mild hyperinflation  3.  Trace aortic atherosclerotic calcifications   Original Report Authenticated By: Alvino Blood Elbow Complete Left  05/04/2012  *RADIOLOGY REPORT*  Clinical Data: Fall yesterday left elbow, the and hip pain  LEFT ELBOW - COMPLETE 3+ VIEW  Comparison: None.  Findings: No acute fracture, malalignment or elbow joint effusion. No focal soft tissue swelling.  IMPRESSION: Negative  radiographs of the left elbow   Original Report Authenticated By: Alvino Blood Hip Complete Left  05/04/2012  *RADIOLOGY REPORT*  Clinical Data: Fall yesterday, left hip pain  LEFT HIP - COMPLETE 2+ VIEW  Comparison: Concurrently obtained radiographs of the left knee  Findings: The bones appear mildly osteopenic.  No acute fracture, or malalignment.  The femoral head remains located within the acetabulum.  The bony pelvis is intact.  Left common iliac arterial stent.  IMPRESSION: No acute fracture, or malalignment.   Original Report Authenticated By: Vilma Prader    US Abdomen Complete  05/04/2012  *RADIOLOGY REPORT*  Clinical Data:  Cholelithiasis.  Clinical concern for choledocholithiasis.  COMPLETE ABDOMINAL ULTRASOUND  Comparison:  Ultrasound dated 11/11/2011 and CT dated 11/11/2011.  Findings:  Gallbladder:  Small number of small gallstones in the gallbladder. The largest measures 3 mm in maximum diameter.  No gallbladder wall thickening or pericholecystic fluid.  The patient was not tender over the gallbladder.  Common bile duct:  Dilated, measuring 8.9 mm in diameter proximally.  There is a suggestion of soft tissue type material in the distal common duct with no visible calculi.  Liver:  Mildly echogenic.  Corresponding to minimal decreased density on the previous CT.  IVC:  Normal proximally.  Pancreas:  Again demonstrated is a cystic structure in the proximal body of the pancreas, measuring 4.8 x 4.7 x 2.9 cm in maximum dimensions.  The remainder of the pancreas is poorly visualized and contained extensive dense calcifications on the  previous CT.  Spleen:  Normal, measuring 3.1 cm in length.  Right Kidney:  Normal, measuring 11.1 cm in length.  Left Kidney:  6 mm cyst in the midportion of the kidney. Otherwise, normal, measuring 11.1 cm in length.  Abdominal aorta:  No aneurysm identified.  IMPRESSION:  1.  Small number of small gallstones in the gallbladder without evidence of cholecystitis. 2.  Dilated common duct with possible sludge and small stones in the distal common duct. 3.  Mild increase in size of the previously demonstrated pancreatic pseudocyst. 4.  Mild hepatic steatosis.   Original Report Authenticated By: Darrol Angel, M.D.    Dg Knee Complete 4 Views Left  05/04/2012  *RADIOLOGY REPORT*  Clinical Data: Left knee pain, fell while walking do not yesterday  LEFT KNEE - COMPLETE 4+ VIEW  Comparison: None.  Findings: The bones are mildly osteopenic.  No acute fracture, malalignment or knee joint effusion.  No significant degenerative changes.  Mottled appearance of the patella favored to represent osteopenia.  Scattered atherosclerotic vascular calcifications.  IMPRESSION:  No acute fracture, malalignment or joint effusion.  Osteopenia   Original Report Authenticated By: HEATH       1. Gallbladder disease   2. Abdominal pain   3. Fall   4. Knee pain   5. Hip pain       MDM  Mitchell Mccall presents for right upper quadrant pain, weakness and confusion and seizure activity.  Concern for possible infection versus non-nutrition and her nausea.  Will evaluate for cause of weakness. Loss evaluation for pain in the left extremities secondary to fall.  2 mg of Ativan given IV for seizure activity.  No further seizure activity after Ativan given.  CMP unremarkable, troponin negative, CBC with mild leukocytosis at 11.2 without left shift. Lipase mildly elevated at 60.  Right upper quadrant abdominal ultrasound with mildly dilated common bile duct. No elevation in liver function. No concern for cholecystitis.  Patient is  scheduled to have gallbladder removed on Friday morning.  Left knee, left hip and left elbow x-rays without acute bony abnormalities.  Concern for fracture at this time.  Discharge home with pain and nausea control.  Discussed this with the patient and his family.  I have also discussed reasons to return immediately to the ER.  Patient expresses understanding and agrees with plan.  1. Medications: Norco, Zofran 2. Treatment: Rest, drink plenty of fluids, take medications as prescribed 3. Follow Up: With surgeon on Friday morning for removal of gallbladder.           Dahlia Client Kamorah Nevils, PA-C 05/04/12 2314

## 2012-05-04 NOTE — ED Notes (Signed)
Seizure pads placed to the bed for pt safety. Suction at the bedside.

## 2012-05-04 NOTE — ED Notes (Signed)
Pt eating and drinking fluids.

## 2012-05-04 NOTE — ED Notes (Signed)
Pt was able to keep food and fluids down, denies pain.

## 2012-05-05 LAB — URINALYSIS, ROUTINE W REFLEX MICROSCOPIC
Bilirubin Urine: NEGATIVE
Glucose, UA: 100 mg/dL — AB
Hgb urine dipstick: NEGATIVE
Ketones, ur: 40 mg/dL — AB
Protein, ur: NEGATIVE mg/dL

## 2012-05-05 MED ORDER — DEXTROSE 5 % IV SOLN
2.0000 g | INTRAVENOUS | Status: DC
  Filled 2012-05-05: qty 2

## 2012-05-06 ENCOUNTER — Encounter (HOSPITAL_COMMUNITY): Payer: Self-pay | Admitting: Anesthesiology

## 2012-05-06 ENCOUNTER — Encounter (HOSPITAL_COMMUNITY): Payer: Self-pay | Admitting: Certified Registered"

## 2012-05-06 ENCOUNTER — Ambulatory Visit (HOSPITAL_COMMUNITY): Payer: Medicare Other | Admitting: Anesthesiology

## 2012-05-06 ENCOUNTER — Ambulatory Visit (HOSPITAL_COMMUNITY): Payer: Medicare Other

## 2012-05-06 ENCOUNTER — Encounter (HOSPITAL_COMMUNITY)
Admission: RE | Disposition: A | Discharge status: Home / Self Care | Payer: Self-pay | Source: Ambulatory Visit | Attending: General Surgery

## 2012-05-06 ENCOUNTER — Ambulatory Visit (HOSPITAL_COMMUNITY)
Admission: RE | Admit: 2012-05-06 | Discharge: 2012-05-07 | Disposition: A | Discharge status: Home / Self Care | Payer: Medicare Other | Source: Ambulatory Visit | Attending: General Surgery | Admitting: General Surgery

## 2012-05-06 DIAGNOSIS — K828 Other specified diseases of gallbladder: Secondary | ICD-10-CM | POA: Diagnosis present

## 2012-05-06 DIAGNOSIS — E118 Type 2 diabetes mellitus with unspecified complications: Secondary | ICD-10-CM | POA: Diagnosis present

## 2012-05-06 DIAGNOSIS — K811 Chronic cholecystitis: Secondary | ICD-10-CM

## 2012-05-06 DIAGNOSIS — K861 Other chronic pancreatitis: Secondary | ICD-10-CM | POA: Insufficient documentation

## 2012-05-06 DIAGNOSIS — Z01812 Encounter for preprocedural laboratory examination: Secondary | ICD-10-CM | POA: Insufficient documentation

## 2012-05-06 DIAGNOSIS — K8010 Calculus of gallbladder with chronic cholecystitis without obstruction: Secondary | ICD-10-CM | POA: Insufficient documentation

## 2012-05-06 DIAGNOSIS — I10 Essential (primary) hypertension: Secondary | ICD-10-CM | POA: Insufficient documentation

## 2012-05-06 DIAGNOSIS — F1020 Alcohol dependence, uncomplicated: Secondary | ICD-10-CM | POA: Diagnosis present

## 2012-05-06 DIAGNOSIS — F17200 Nicotine dependence, unspecified, uncomplicated: Secondary | ICD-10-CM | POA: Insufficient documentation

## 2012-05-06 DIAGNOSIS — E1165 Type 2 diabetes mellitus with hyperglycemia: Secondary | ICD-10-CM | POA: Diagnosis present

## 2012-05-06 DIAGNOSIS — K219 Gastro-esophageal reflux disease without esophagitis: Secondary | ICD-10-CM | POA: Diagnosis present

## 2012-05-06 DIAGNOSIS — Z01810 Encounter for preprocedural cardiovascular examination: Secondary | ICD-10-CM | POA: Insufficient documentation

## 2012-05-06 DIAGNOSIS — K860 Alcohol-induced chronic pancreatitis: Secondary | ICD-10-CM | POA: Diagnosis present

## 2012-05-06 DIAGNOSIS — E119 Type 2 diabetes mellitus without complications: Secondary | ICD-10-CM | POA: Insufficient documentation

## 2012-05-06 HISTORY — PX: CHOLECYSTECTOMY: SHX55

## 2012-05-06 LAB — GLUCOSE, CAPILLARY
Glucose-Capillary: 229 mg/dL — ABNORMAL HIGH (ref 70–99)
Glucose-Capillary: 192 mg/dL — ABNORMAL HIGH (ref 70–99)

## 2012-05-06 SURGERY — LAPAROSCOPIC CHOLECYSTECTOMY WITH INTRAOPERATIVE CHOLANGIOGRAM
Anesthesia: General | Site: Abdomen | Wound class: Contaminated

## 2012-05-06 MED ORDER — LORAZEPAM 1 MG PO TABS: 1.0000 mg | ORAL_TABLET | Freq: Four times a day (QID) | ORAL | Status: DC | PRN

## 2012-05-06 MED ORDER — SODIUM CHLORIDE 0.9 % IR SOLN
Status: DC | PRN
  Administered 2012-05-06: 1000 mL

## 2012-05-06 MED ORDER — OXYCODONE-ACETAMINOPHEN 5-325 MG PO TABS
1.0000 | ORAL_TABLET | ORAL | Status: DC | PRN
  Administered 2012-05-06 – 2012-05-07 (×3): 2 via ORAL
  Filled 2012-05-06 (×3): qty 2

## 2012-05-06 MED ORDER — THIAMINE HCL 100 MG/ML IJ SOLN
100.0000 mg | Freq: Every day | INTRAMUSCULAR | Status: DC
  Filled 2012-05-06 (×2): qty 1

## 2012-05-06 MED ORDER — ONDANSETRON HCL 4 MG/2ML IJ SOLN
INTRAMUSCULAR | Status: AC
  Filled 2012-05-06: qty 2

## 2012-05-06 MED ORDER — HYDROMORPHONE HCL PF 1 MG/ML IJ SOLN
0.2500 mg | INTRAMUSCULAR | Status: DC | PRN
  Administered 2012-05-06: 0.5 mg via INTRAVENOUS

## 2012-05-06 MED ORDER — LACTATED RINGERS IV SOLN
INTRAVENOUS | Status: DC
  Administered 2012-05-06: 09:00:00 via INTRAVENOUS

## 2012-05-06 MED ORDER — NITROGLYCERIN 0.4 MG SL SUBL: 0.4000 mg | SUBLINGUAL_TABLET | SUBLINGUAL | Status: DC | PRN

## 2012-05-06 MED ORDER — PHENYLEPHRINE HCL 10 MG/ML IJ SOLN
INTRAMUSCULAR | Status: DC | PRN
  Administered 2012-05-06 (×4): 40 ug via INTRAVENOUS

## 2012-05-06 MED ORDER — LACTATED RINGERS IV SOLN
INTRAVENOUS | Status: DC | PRN
  Administered 2012-05-06 (×3): via INTRAVENOUS

## 2012-05-06 MED ORDER — ACETAMINOPHEN 325 MG PO TABS: 650.0000 mg | ORAL_TABLET | ORAL | Status: DC | PRN

## 2012-05-06 MED ORDER — INSULIN ASPART 100 UNIT/ML ~~LOC~~ SOLN
0.0000 [IU] | Freq: Every day | SUBCUTANEOUS | Status: DC
  Administered 2012-05-06: 3 [IU] via SUBCUTANEOUS

## 2012-05-06 MED ORDER — OXYCODONE HCL 5 MG PO TABS
ORAL_TABLET | ORAL | Status: AC
  Filled 2012-05-06: qty 2

## 2012-05-06 MED ORDER — ONDANSETRON HCL 4 MG/2ML IJ SOLN: 4.0000 mg | Freq: Four times a day (QID) | INTRAMUSCULAR | Status: DC | PRN

## 2012-05-06 MED ORDER — PANCRELIPASE (LIP-PROT-AMYL) 12000-38000 UNITS PO CPEP
1.0000 | ORAL_CAPSULE | Freq: Four times a day (QID) | ORAL | Status: DC
  Administered 2012-05-06 – 2012-05-07 (×3): 1 via ORAL
  Filled 2012-05-06 (×6): qty 2

## 2012-05-06 MED ORDER — ADULT MULTIVITAMIN W/MINERALS CH
1.0000 | ORAL_TABLET | Freq: Every day | ORAL | Status: DC
Start: 2012-05-07 — End: 2012-05-07
  Administered 2012-05-07: 1 via ORAL
  Filled 2012-05-06: qty 1

## 2012-05-06 MED ORDER — GLYCOPYRROLATE 0.2 MG/ML IJ SOLN
INTRAMUSCULAR | Status: DC | PRN
  Administered 2012-05-06: 0.6 mg via INTRAVENOUS

## 2012-05-06 MED ORDER — OXYCODONE HCL 5 MG PO TABS: 5.0000 mg | ORAL_TABLET | ORAL | Status: DC | PRN

## 2012-05-06 MED ORDER — PIOGLITAZONE HCL 30 MG PO TABS
30.0000 mg | ORAL_TABLET | Freq: Every day | ORAL | Status: DC
Start: 2012-05-06 — End: 2012-05-07
  Administered 2012-05-06 – 2012-05-07 (×2): 30 mg via ORAL
  Filled 2012-05-06 (×2): qty 1

## 2012-05-06 MED ORDER — NEOSTIGMINE METHYLSULFATE 1 MG/ML IJ SOLN
INTRAMUSCULAR | Status: DC | PRN
  Administered 2012-05-06: 5 mg via INTRAVENOUS

## 2012-05-06 MED ORDER — FOLIC ACID 1 MG PO TABS
1.0000 mg | ORAL_TABLET | Freq: Every day | ORAL | Status: DC
  Administered 2012-05-06 – 2012-05-07 (×2): 1 mg via ORAL
  Filled 2012-05-06 (×2): qty 1

## 2012-05-06 MED ORDER — LIDOCAINE HCL 4 % MT SOLN
OROMUCOSAL | Status: DC | PRN
  Administered 2012-05-06: 4 mL via TOPICAL

## 2012-05-06 MED ORDER — LORAZEPAM 2 MG/ML IJ SOLN: 1.0000 mg | Freq: Four times a day (QID) | INTRAMUSCULAR | Status: DC | PRN

## 2012-05-06 MED ORDER — 0.9 % SODIUM CHLORIDE (POUR BTL) OPTIME
TOPICAL | Status: DC | PRN
  Administered 2012-05-06: 1000 mL

## 2012-05-06 MED ORDER — ACETAMINOPHEN 650 MG RE SUPP: 650.0000 mg | RECTAL | Status: DC | PRN

## 2012-05-06 MED ORDER — MIDAZOLAM HCL 5 MG/5ML IJ SOLN
INTRAMUSCULAR | Status: DC | PRN
  Administered 2012-05-06 (×2): 2 mg via INTRAVENOUS

## 2012-05-06 MED ORDER — SODIUM CHLORIDE 0.9 % IJ SOLN: 3.0000 mL | INTRAMUSCULAR | Status: DC | PRN

## 2012-05-06 MED ORDER — MORPHINE SULFATE 2 MG/ML IJ SOLN: 1.0000 mg | INTRAMUSCULAR | Status: DC | PRN

## 2012-05-06 MED ORDER — SPIRITUS FRUMENTI
1.0000 | Freq: Every day | ORAL | Status: DC
  Filled 2012-05-06: qty 1

## 2012-05-06 MED ORDER — BUPIVACAINE-EPINEPHRINE 0.25% -1:200000 IJ SOLN
INTRAMUSCULAR | Status: DC | PRN
  Administered 2012-05-06: 30 mL

## 2012-05-06 MED ORDER — SODIUM CHLORIDE 0.9 % IV SOLN: 250.0000 mL | INTRAVENOUS | Status: DC | PRN

## 2012-05-06 MED ORDER — GABAPENTIN 300 MG PO CAPS
300.0000 mg | ORAL_CAPSULE | Freq: Two times a day (BID) | ORAL | Status: DC
  Administered 2012-05-06 – 2012-05-07 (×2): 300 mg via ORAL
  Filled 2012-05-06 (×3): qty 1

## 2012-05-06 MED ORDER — VITAMIN B-1 100 MG PO TABS
100.0000 mg | ORAL_TABLET | Freq: Every day | ORAL | Status: DC
  Administered 2012-05-06 – 2012-05-07 (×2): 100 mg via ORAL
  Filled 2012-05-06 (×2): qty 1

## 2012-05-06 MED ORDER — SODIUM CHLORIDE 0.9 % IV SOLN
INTRAVENOUS | Status: DC | PRN
  Administered 2012-05-06: 11:00:00

## 2012-05-06 MED ORDER — LIDOCAINE HCL (CARDIAC) 20 MG/ML IV SOLN
INTRAVENOUS | Status: DC | PRN
  Administered 2012-05-06: 100 mg via INTRAVENOUS

## 2012-05-06 MED ORDER — INSULIN ASPART 100 UNIT/ML ~~LOC~~ SOLN
0.0000 [IU] | Freq: Three times a day (TID) | SUBCUTANEOUS | Status: DC
  Administered 2012-05-07: 3 [IU] via SUBCUTANEOUS

## 2012-05-06 MED ORDER — FENTANYL CITRATE 0.05 MG/ML IJ SOLN
INTRAMUSCULAR | Status: DC | PRN
  Administered 2012-05-06: 100 ug via INTRAVENOUS
  Administered 2012-05-06 (×2): 50 ug via INTRAVENOUS
  Administered 2012-05-06: 100 ug via INTRAVENOUS
  Administered 2012-05-06: 50 ug via INTRAVENOUS

## 2012-05-06 MED ORDER — HYDROMORPHONE HCL PF 1 MG/ML IJ SOLN
INTRAMUSCULAR | Status: AC
  Filled 2012-05-06: qty 1

## 2012-05-06 MED ORDER — DULOXETINE HCL 20 MG PO CPEP
20.0000 mg | ORAL_CAPSULE | Freq: Two times a day (BID) | ORAL | Status: DC
  Administered 2012-05-06 – 2012-05-07 (×2): 20 mg via ORAL
  Filled 2012-05-06 (×3): qty 1

## 2012-05-06 MED ORDER — POTASSIUM CHLORIDE IN NACL 20-0.9 MEQ/L-% IV SOLN
INTRAVENOUS | Status: DC
  Administered 2012-05-06: 75 mL/h via INTRAVENOUS
  Administered 2012-05-07: 05:00:00 via INTRAVENOUS
  Filled 2012-05-06 (×3): qty 1000

## 2012-05-06 MED ORDER — ONDANSETRON HCL 4 MG/2ML IJ SOLN
INTRAMUSCULAR | Status: DC | PRN
  Administered 2012-05-06: 4 mg via INTRAVENOUS

## 2012-05-06 MED ORDER — MORPHINE SULFATE 2 MG/ML IJ SOLN
1.0000 mg | INTRAMUSCULAR | Status: DC | PRN
  Administered 2012-05-06 – 2012-05-07 (×2): 1 mg via INTRAVENOUS
  Administered 2012-05-07: 2 mg via INTRAVENOUS
  Filled 2012-05-06 (×3): qty 1

## 2012-05-06 MED ORDER — THERA M PLUS PO TABS: 1.0000 | ORAL_TABLET | Freq: Every day | ORAL | Status: DC

## 2012-05-06 MED ORDER — HYDROMORPHONE HCL PF 1 MG/ML IJ SOLN
INTRAMUSCULAR | Status: AC
  Administered 2012-05-06: 1 mg
  Filled 2012-05-06: qty 1

## 2012-05-06 MED ORDER — ONDANSETRON HCL 4 MG/2ML IJ SOLN
4.0000 mg | Freq: Once | INTRAMUSCULAR | Status: AC | PRN
  Administered 2012-05-06: 4 mg via INTRAVENOUS

## 2012-05-06 MED ORDER — ONDANSETRON HCL 4 MG PO TABS: 4.0000 mg | ORAL_TABLET | Freq: Four times a day (QID) | ORAL | Status: DC | PRN

## 2012-05-06 MED ORDER — PROPOFOL 10 MG/ML IV BOLUS
INTRAVENOUS | Status: DC | PRN
  Administered 2012-05-06: 200 mg via INTRAVENOUS

## 2012-05-06 MED ORDER — SPIRITUS FRUMENTI
1.0000 | Freq: Every day | ORAL | Status: DC
  Administered 2012-05-06: 1 via ORAL
  Filled 2012-05-06 (×4): qty 1

## 2012-05-06 MED ORDER — SODIUM CHLORIDE 0.9 % IJ SOLN: 3.0000 mL | Freq: Two times a day (BID) | INTRAMUSCULAR | Status: DC

## 2012-05-06 MED ORDER — BUPIVACAINE-EPINEPHRINE PF 0.25-1:200000 % IJ SOLN
INTRAMUSCULAR | Status: AC
  Filled 2012-05-06: qty 30

## 2012-05-06 MED ORDER — PANCRELIPASE (LIP-PROT-AMYL) 25000 UNITS PO CPEP: 1.0000 | ORAL_CAPSULE | Freq: Four times a day (QID) | ORAL | Status: DC

## 2012-05-06 MED ORDER — PROPRANOLOL HCL 1 MG/ML IV SOLN
INTRAVENOUS | Status: DC | PRN
  Administered 2012-05-06: .1 mg via INTRAVENOUS
  Administered 2012-05-06: .2 mg via INTRAVENOUS
  Administered 2012-05-06 (×7): .1 mg via INTRAVENOUS
  Administered 2012-05-06: .2 mg via INTRAVENOUS
  Administered 2012-05-06 (×3): .1 mg via INTRAVENOUS
  Administered 2012-05-06: .2 mg via INTRAVENOUS
  Administered 2012-05-06: .1 mg via INTRAVENOUS
  Administered 2012-05-06: .2 mg via INTRAVENOUS

## 2012-05-06 MED ORDER — ENOXAPARIN SODIUM 30 MG/0.3ML ~~LOC~~ SOLN
30.0000 mg | SUBCUTANEOUS | Status: DC
  Administered 2012-05-07: 30 mg via SUBCUTANEOUS
  Filled 2012-05-06 (×2): qty 0.3

## 2012-05-06 MED ORDER — ROCURONIUM BROMIDE 100 MG/10ML IV SOLN
INTRAVENOUS | Status: DC | PRN
  Administered 2012-05-06: 35 mg via INTRAVENOUS

## 2012-05-06 SURGICAL SUPPLY — 50 items
APL SKNCLS STERI-STRIP NONHPOA (GAUZE/BANDAGES/DRESSINGS) ×1
APPLIER CLIP 5 13 M/L LIGAMAX5 (MISCELLANEOUS) ×4
APR CLP MED LRG 5 ANG JAW (MISCELLANEOUS) ×2
BAG SPEC RTRVL LRG 6X4 10 (ENDOMECHANICALS) ×1
BANDAGE ADHESIVE 1X3 (GAUZE/BANDAGES/DRESSINGS) ×6 IMPLANT
BENZOIN TINCTURE PRP APPL 2/3 (GAUZE/BANDAGES/DRESSINGS) ×2 IMPLANT
BLADE SURG ROTATE 9660 (MISCELLANEOUS) ×1 IMPLANT
CANISTER SUCTION 2500CC (MISCELLANEOUS) ×2 IMPLANT
CHLORAPREP W/TINT 26ML (MISCELLANEOUS) ×2 IMPLANT
CLIP APPLIE 5 13 M/L LIGAMAX5 (MISCELLANEOUS) ×1 IMPLANT
CLOTH BEACON ORANGE TIMEOUT ST (SAFETY) ×2 IMPLANT
COVER MAYO STAND STRL (DRAPES) ×2 IMPLANT
COVER SURGICAL LIGHT HANDLE (MISCELLANEOUS) ×2 IMPLANT
DECANTER SPIKE VIAL GLASS SM (MISCELLANEOUS) ×2 IMPLANT
DRAPE C-ARM 42X72 X-RAY (DRAPES) ×2 IMPLANT
DRAPE UTILITY 15X26 W/TAPE STR (DRAPE) ×4 IMPLANT
DRSG TEGADERM 4X4.75 (GAUZE/BANDAGES/DRESSINGS) ×2 IMPLANT
ELECT REM PT RETURN 9FT ADLT (ELECTROSURGICAL) ×2
ELECTRODE REM PT RTRN 9FT ADLT (ELECTROSURGICAL) ×1 IMPLANT
GAUZE SPONGE 2X2 8PLY STRL LF (GAUZE/BANDAGES/DRESSINGS) ×1 IMPLANT
GLOVE BIOGEL M 7.0 STRL (GLOVE) ×3 IMPLANT
GLOVE BIOGEL M STRL SZ7.5 (GLOVE) ×2 IMPLANT
GLOVE BIOGEL PI IND STRL 6.5 (GLOVE) IMPLANT
GLOVE BIOGEL PI IND STRL 8 (GLOVE) ×1 IMPLANT
GLOVE BIOGEL PI INDICATOR 6.5 (GLOVE) ×3
GLOVE BIOGEL PI INDICATOR 8 (GLOVE) ×2
GLOVE ECLIPSE 6.5 STRL STRAW (GLOVE) ×6 IMPLANT
GLOVE ECLIPSE 7.0 STRL STRAW (GLOVE) ×1 IMPLANT
GLOVE ECLIPSE 8.0 STRL XLNG CF (GLOVE) ×1 IMPLANT
GOWN DECONTAM LG NS DISP (GOWN DISPOSABLE) ×7 IMPLANT
GOWN STRL NON-REIN LRG LVL3 (GOWN DISPOSABLE) ×6 IMPLANT
GOWN STRL REIN XL XLG (GOWN DISPOSABLE) ×2 IMPLANT
HEMOSTAT SNOW SURGICEL 2X4 (HEMOSTASIS) ×1 IMPLANT
KIT BASIN OR (CUSTOM PROCEDURE TRAY) ×2 IMPLANT
KIT ROOM TURNOVER OR (KITS) ×2 IMPLANT
NS IRRIG 1000ML POUR BTL (IV SOLUTION) ×2 IMPLANT
PAD ARMBOARD 7.5X6 YLW CONV (MISCELLANEOUS) ×2 IMPLANT
POUCH SPECIMEN RETRIEVAL 10MM (ENDOMECHANICALS) ×2 IMPLANT
SCISSORS LAP 5X35 DISP (ENDOMECHANICALS) IMPLANT
SET CHOLANGIOGRAPH 5 50 .035 (SET/KITS/TRAYS/PACK) ×2 IMPLANT
SET IRRIG TUBING LAPAROSCOPIC (IRRIGATION / IRRIGATOR) ×2 IMPLANT
SLEEVE ENDOPATH XCEL 5M (ENDOMECHANICALS) ×4 IMPLANT
SPECIMEN JAR SMALL (MISCELLANEOUS) ×2 IMPLANT
SPONGE GAUZE 2X2 STER 10/PKG (GAUZE/BANDAGES/DRESSINGS) ×1
SUT MNCRL AB 4-0 PS2 18 (SUTURE) ×2 IMPLANT
TOWEL OR 17X24 6PK STRL BLUE (TOWEL DISPOSABLE) ×2 IMPLANT
TOWEL OR 17X26 10 PK STRL BLUE (TOWEL DISPOSABLE) ×2 IMPLANT
TRAY LAPAROSCOPIC (CUSTOM PROCEDURE TRAY) ×2 IMPLANT
TROCAR XCEL BLUNT TIP 100MML (ENDOMECHANICALS) ×2 IMPLANT
TROCAR XCEL NON-BLD 5MMX100MML (ENDOMECHANICALS) ×2 IMPLANT

## 2012-05-06 NOTE — H&P (Signed)
Mitchell Mccall is an 61 y.o. male.   Chief Complaint: here for surgery HPI: 61 year old Caucasian male referred by Dr. Dulce Sellar for consideration of cholecystectomy. 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 He was actually in the hospital back in March. During that hospitalization GI medicine as well as our surgical service was consulted because of his pain. At that time it was felt that cholecystectomy should be deferred. Since hospital discharge she is continued to have ongoing upper abdominal pain. His workup has been quite extensive. He has had an abdominal ultrasound, CT scan, and HIDA scan. He underwent an endoscopic ultrasound by Dr. Dulce Sellar which revealed chronic pancreatitis as well as a pancreatic pseudocyst. It was aspirated and drained. The cytology was benign. However the cyst recurred. He has ongoing pain. He describes it as a spasm in his upper abdomen. He feels like something is contracting inside his abdomen. The pain is generally after eating. It is associated with nausea. He will occasionally vomit. The spasm will last for several hours. It radiates into his right upper quadrant. He denies any acholic stools. There's been no particular food association. He has lost some weight up to about 9 pounds. He does have some heartburn. He denies any difficulty swallowing. He denies any NSAID use. He continues to smoke. He denies any melena, hematochezia, jaundice. He has daily bowel movement.He continues to drink several beers a day. He states that he drinks no more than 3 beers a day. He states he no longer drinks hard stuff.  Went to ED 2 days ago with abdominal pain and fall.   Past Medical History  Diagnosis Date  . Peripheral vascular disease   . Diabetes mellitus   . GERD (gastroesophageal reflux disease)   . Stomach ulcer   . Hypertension   . Anxiety   . Depression   . Hepatitis   . H/O hiatal hernia   . Anemia   . Blood  transfusion   . Undescended testicle   . Pneumonia DEC 2003  . Colon polyps   . Chronic lower back pain   . Subdural hematoma JULY 2006  . Duodenitis   . Esophagitis   . Coarse tremors   . Diverticulosis   . Rib fracture DEC 2005    Right sided from a fall  . Rectal bleed JULY 2004  . Vitamin d deficiency   . Macrocytosis   . DTs (delirium tremens)   . Claudication in peripheral vascular disease   . Pancreatitis, alcoholic   . Fatty liver, alcoholic   . Hemorrhoids   . 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 Pancreatic pseudocyst   . Blood dyscrasia     macrocytosis  . Seizures     1-2 yrs ago ? cause    Past Surgical History  Procedure Date  . Vascular surgery   . Ligament repair 1955    left leg   . False aneurysm repair 09/16/2011    Procedure: REPAIR FALSE ANEURYSM;  Surgeon: Larina Earthly, MD;  Location: [facility] OR;  Service: Vascular;  Laterality: Left;  . Incision and drainage of wound     groin  . Eus 01/20/2012    Procedure: ESOPHAGEAL ENDOSCOPIC ULTRASOUND (EUS) RADIAL;  Surgeon: Willis Modena, MD;  Location: [facility] ENDOSCOPY;  Service: Endoscopy;  Laterality: N/A;  . Fine needle aspiration 01/20/2012    Procedure: FINE NEEDLE ASPIRATION (FNA) LINEAR;  Surgeon: Willis Modena, MD;  Location: [facility] ENDOSCOPY;  Service: Endoscopy;  Laterality: N/A;    Family History  Problem Relation Age of Onset  . Diabetes Father   . Coronary artery disease Mother    Social History:  reports that he has been smoking Cigarettes.  He has a 50 pack-year smoking history. He has never used smokeless tobacco. He reports that he drinks about 7.2 ounces of alcohol per week. He reports that he does not use illicit drugs.  Allergies: No Known Allergies  Medications Prior to Admission  Medication Sig Dispense Refill  . aspirin 81 MG tablet Take 81 mg by mouth daily.      . clopidogrel (PLAVIX) 75 MG tablet Take 75 mg by mouth daily.      .  DULoxetine (CYMBALTA) 20 MG capsule Take 20 mg by mouth 2 (two) times daily.       . ergocalciferol (VITAMIN D2) 50000 UNITS capsule Take 50,000 Units by mouth once a week. SUNDAY      . gabapentin (NEURONTIN) 300 MG capsule Take 300 mg by mouth 2 (two) times daily.       Marland Kitchen HYDROcodone-acetaminophen (NORCO/VICODIN) 5-325 MG per tablet Take 2 tablets by mouth every 4 (four) hours as needed for pain.  10 tablet  0  . insulin detemir (LEVEMIR) 100 UNIT/ML injection Inject 30 Units into the skin 2 (two) times daily.      Marland Kitchen l-methylfolate-B6-B12 (METANX) 3-35-2 MG TABS Take 1 tablet by mouth 2 (two) times daily.      . Multiple Vitamins-Minerals (MULTIVITAMINS THER. W/MINERALS) TABS Take 1 tablet by mouth daily.       . nitroGLYCERIN (NITROSTAT) 0.4 MG SL tablet Place 1 tablet (0.4 mg total) under the tongue every 5 (five) minutes x 3 doses as needed for chest pain (if needed for severe chest pain/pressure).  25 tablet  2  . ondansetron (ZOFRAN ODT) 4 MG disintegrating tablet Take 1 tablet (4 mg total) by mouth every 8 (eight) hours as needed for nausea.  10 tablet  0  . oxyCODONE (OXY IR/ROXICODONE) 5 MG immediate release tablet Take 1 tablet (5 mg total) by mouth every 6 (six) hours as needed for pain. For pain  15 tablet  0  . Pancrelipase, Lip-Prot-Amyl, (ZENPEP) 25000 UNITS CPEP Take 1-2 capsules by mouth 4 (four) times daily. 2 capsules with each meal and 1 capsule with a snack      . pioglitazone (ACTOS) 30 MG tablet Take 30 mg by mouth daily.         Results for orders placed during the hospital encounter of 05/06/12 (from the past 48 hour(s))  GLUCOSE, CAPILLARY     Status: Abnormal   Collection Time   05/06/12  8:03 AM      Component Value Range Comment   Glucose-Capillary 243 (*) 70 - 99 mg/dL   GLUCOSE, CAPILLARY     Status: Abnormal   Collection Time   05/06/12  9:15 AM      Component Value Range Comment   Glucose-Capillary 206 (*) 70 - 99 mg/dL    Dg Chest 2 View  1/61/0960   *RADIOLOGY REPORT*  Clinical Data: Short of breath, fell yesterday  CHEST - 2 VIEW  Comparison: Prior chest x-ray 11/11/2011  Findings: Similar appearance of slight hyperinflation of the lungs. Negative for edema, focal airspace consolidation, pneumothorax, or pleural effusion.  Cardiac and mediastinal contours within normal limits.  Trace atherosclerotic calcifications in the aorta.  No acute osseous abnormality.  IMPRESSION:  1.  No acute cardiopulmonary disease. 2.  Stable mild hyperinflation 3.  Trace aortic atherosclerotic calcifications   Original Report Authenticated By: Alvino Blood Elbow Complete Left  05/04/2012  *RADIOLOGY REPORT*  Clinical Data: Fall yesterday left elbow, the and hip pain  LEFT ELBOW - COMPLETE 3+ VIEW  Comparison: None.  Findings: No acute fracture, malalignment or elbow joint effusion. No focal soft tissue swelling.  IMPRESSION: Negative radiographs of the left elbow   Original Report Authenticated By: Alvino Blood Hip Complete Left  05/04/2012  *RADIOLOGY REPORT*  Clinical Data: Fall yesterday, left hip pain  LEFT HIP - COMPLETE 2+ VIEW  Comparison: Concurrently obtained radiographs of the left knee  Findings: The bones appear mildly osteopenic.  No acute fracture, or malalignment.  The femoral head remains located within the acetabulum.  The bony pelvis is intact.  Left common iliac arterial stent.  IMPRESSION: No acute fracture, or malalignment.   Original Report Authenticated By: Vilma Prader    US Abdomen Complete  05/04/2012  *RADIOLOGY REPORT*  Clinical Data:  Cholelithiasis.  Clinical concern for choledocholithiasis.  COMPLETE ABDOMINAL ULTRASOUND  Comparison:  Ultrasound dated 11/11/2011 and CT dated 11/11/2011.  Findings:  Gallbladder:  Small number of small gallstones in the gallbladder. The largest measures 3 mm in maximum diameter.  No gallbladder wall thickening or pericholecystic fluid.  The patient was not tender over the gallbladder.  Common bile duct:  Dilated,  measuring 8.9 mm in diameter proximally.  There is a suggestion of soft tissue type material in the distal common duct with no visible calculi.  Liver:  Mildly echogenic.  Corresponding to minimal decreased density on the previous CT.  IVC:  Normal proximally.  Pancreas:  Again demonstrated is a cystic structure in the proximal body of the pancreas, measuring 4.8 x 4.7 x 2.9 cm in maximum dimensions.  The remainder of the pancreas is poorly visualized and contained extensive dense calcifications on the previous CT.  Spleen:  Normal, measuring 3.1 cm in length.  Right Kidney:  Normal, measuring 11.1 cm in length.  Left Kidney:  6 mm cyst in the midportion of the kidney. Otherwise, normal, measuring 11.1 cm in length.  Abdominal aorta:  No aneurysm identified.  IMPRESSION:  1.  Small number of small gallstones in the gallbladder without evidence of cholecystitis. 2.  Dilated common duct with possible sludge and small stones in the distal common duct. 3.  Mild increase in size of the previously demonstrated pancreatic pseudocyst. 4.  Mild hepatic steatosis.   Original Report Authenticated By: Darrol Angel, M.D.    Dg Knee Complete 4 Views Left  05/04/2012  *RADIOLOGY REPORT*  Clinical Data: Left knee pain, fell while walking do not yesterday  LEFT KNEE - COMPLETE 4+ VIEW  Comparison: None.  Findings: The bones are mildly osteopenic.  No acute fracture, malalignment or knee joint effusion.  No significant degenerative changes.  Mottled appearance of the patella favored to represent osteopenia.  Scattered atherosclerotic vascular calcifications.  IMPRESSION:  No acute fracture, malalignment or joint effusion.  Osteopenia   Original Report Authenticated By: HEATH     Review of Systems  Constitutional: Negative for fever and chills.  HENT: Negative for hearing loss.   Respiratory: Negative for shortness of breath.   Cardiovascular: Negative for chest pain, palpitations and PND.  Gastrointestinal: Positive for  nausea, vomiting and abdominal pain.  Genitourinary: Negative for dysuria.  Neurological: Positive for seizures.       Was  in ED on 9/11 with n/v/abd pain. During interview with ED - per note - it appeared pt suffered petit mal seizure. Speech garbled, breathing spont. Postictal then appropriate. Spoke with wife - she reports 1 similar episode years ago- it occurs when he's in a crowded unfamiliar place.  Psychiatric/Behavioral: Positive for substance abuse.       Wife reports he continues to drink about a 6pack per day. He reports a 12 pack/week. Last drink on Wed night.    Blood pressure 126/71, pulse 116, temperature 98.5 F (36.9 C), temperature source Oral, resp. rate 20, SpO2 97.00%. Physical Exam  Vitals reviewed. Constitutional: He is oriented to person, place, and time. He appears well-developed. He is cooperative.  Non-toxic appearance. He does not have a sickly appearance. No distress.  HENT:  Head: Normocephalic and atraumatic.  Right Ear: External ear normal.  Left Ear: External ear normal.  Eyes: Conjunctivae normal are normal. No scleral icterus.  Neck: Neck supple. No tracheal deviation present.  Cardiovascular: Normal rate, regular rhythm and normal heart sounds.   Respiratory: Effort normal and breath sounds normal. No stridor. No respiratory distress.  GI: Soft. Bowel sounds are normal. He exhibits no distension. There is no tenderness.  Musculoskeletal: He exhibits no edema and no tenderness.  Neurological: He is alert and oriented to person, place, and time. He exhibits normal muscle tone.  Skin: Skin is warm and dry. He is not diaphoretic.       Bruise on L shoulder  Psychiatric: He has a normal mood and affect. His behavior is normal.      Assessment/Plan Wife reports no plavix for 1 week  Biliary dyskinesia/chronic cholecystitis Chronic pancreatitis secondary to alcohol Pancreatitic pseudocyst  To OR for lap chole with ioc  All questions asked and  answered. Discussed case with anesthesiologist and mutually decided to proceeded with case. I spoke with wife on phone - confirmed no plavix, discussed his 'seizure history' I explained again to both pt and wife that he should expect some improvement in his symptoms but given his chronic pancreatitis he will most likely have on-going abdominal pain issues. Also reinforced that he is at higher risk for complications given his co-morbidities. They both elected to proceed with surgery  Mary Sella. Andrey Campanile, MD, FACS General, Bariatric, & Minimally Invasive Surgery [facility], [place]   [facility] M 05/06/2012, 9:39 AM

## 2012-05-06 NOTE — Op Note (Signed)
Laparoscopic Cholecystectomy with IOC Procedure Note  Indications: This patient presents with symptomatic gallbladder disease and will undergo laparoscopic cholecystectomy.  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 The risks, benefits, complications, treatment options, and expected outcomes were discussed with the patient. The possibilities of reaction to medication, pulmonary aspiration, perforation of viscus, bleeding, recurrent infection, finding a normal gallbladder, the need for additional procedures, failure to diagnose a condition, the possible need to convert to an open procedure, and creating a complication requiring transfusion or operation were discussed with the patient. The likelihood of improving the patient's symptoms with return to their baseline status is good.  The patient and/or family concurred with the proposed plan, giving informed consent. The site of surgery properly noted. The patient was taken to Operating Room, identified as Mitchell Mccall and the procedure verified as Laparoscopic Cholecystectomy with Intraoperative Cholangiogram. A Time Out was held and the above information confirmed.  Prior to the induction of general anesthesia, antibiotic prophylaxis was administered. General endotracheal anesthesia was then administered and tolerated well. After the induction, the abdomen was prepped with Chloraprep and draped in the sterile fashion. The patient was positioned in the supine position.  Local anesthetic agent was injected into the skin near the umbilicus and an incision made. We dissected down to the abdominal fascia with blunt dissection.  The fascia was incised  vertically and we entered the peritoneal cavity bluntly.  A pursestring suture of 0-Vicryl was placed around the fascial opening.  The Hasson cannula was inserted and secured with the stay suture.  Pneumoperitoneum was then created with CO2 and tolerated well without any adverse changes in the patient's vital signs. An 5-mm port was placed in the subxiphoid position.  Two 5-mm ports were placed in the right upper quadrant. All skin incisions were infiltrated with a local anesthetic agent before making the incision and placing the trocars.   We positioned the patient in reverse Trendelenburg, tilted slightly to the patient's left.  The gallbladder was identified, the fundus grasped and retracted cephalad. Adhesions were lysed bluntly and with the electrocautery where indicated, taking care not to injure any adjacent organs or viscus. The infundibulum was grasped and retracted laterally, exposing the peritoneum overlying the triangle of Calot. This was then divided and exposed in a blunt fashion. A critical view of the cystic duct and cystic artery was obtained.  The cystic duct was clearly identified and bluntly dissected circumferentially. The cystic duct was ligated with a clip distally.   An incision was made in the cystic duct and the [facility] cholangiogram catheter introduced. The catheter was secured using a clip. A cholangiogram was then obtained which showed good visualization of the distal and proximal biliary tree with no sign of filling defects or obstruction.  Contrast flowed easily into the duodenum. However in the common hepatic duct there appeared to be a focal dilation of the bile duct suggestive of a choledochal cyst. There was some spillage of contrast into the GB fossa. The catheter was then removed.   The cystic duct was then ligated with clips and divided. The cystic artery (both a anterior and posterior branch) was identified, dissected free, and each ligated with clips and divided as well.    The gallbladder was  dissected from the liver bed in retrograde fashion with the electrocautery. There was a tiny vessel in the GB fossa which we encountered which we clipped with 2 clips. The gallbladder was removed and placed in an Endocatch sac.  The gallbladder and Endocatch sac were then removed through the umbilical port site. The liver bed was irrigated and inspected. Hemostasis was achieved with the electrocautery. A piece of Ethicon SNoW was placed in the gallbladder fossa. Copious irrigation was utilized and was repeatedly aspirated until clear.  The pursestring suture was used to close the umbilical fascia.    We again inspected the right upper quadrant for hemostasis.  The umbilical closure was inspected and there was no air leak and nothing trapped within the closure. Pneumoperitoneum was released as we removed the trocars.  4-0 Monocryl was used to close the skin.   Dermabond was applied. The patient was then extubated and brought to the recovery room in stable condition. Instrument, sponge, and needle counts were correct at closure and at the conclusion of the case.   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         Disposition: PACU - hemodynamically stable.         Condition: stable  Mary Sella. Andrey Campanile, MD, FACS General, Bariatric, & Minimally Invasive Surgery [facility], [place]

## 2012-05-06 NOTE — Transfer of Care (Signed)
Immediate Anesthesia Transfer of Care Note  Patient: Mitchell Mccall  Procedure(s) Performed: Procedure(s) (LRB) with comments: LAPAROSCOPIC CHOLECYSTECTOMY WITH INTRAOPERATIVE CHOLANGIOGRAM (N/A) - laparoscopic cholecystectomy with intraoperative cholangiogram  Patient Location: PACU  Anesthesia Type: General  Level of Consciousness: sedated  Airway & Oxygen Therapy: Patient Spontanous Breathing  Post-op Assessment: Report given to PACU RN  Post vital signs: stable  Complications: No apparent anesthesia complications

## 2012-05-06 NOTE — OR Nursing (Signed)
Calmer and more appropriately interactive/ oriented after single dose 0.5mg  dilaudid /  sats are 92-96 on 4 lnc put must relocate /reposition probe at times

## 2012-05-06 NOTE — Progress Notes (Signed)
pts sats down on 3 liters, face mask back on

## 2012-05-06 NOTE — Anesthesia Procedure Notes (Signed)
Procedure Name: Intubation Date/Time: 05/06/2012 10:24 AM Performed by: Darcey Nora B Pre-anesthesia Checklist: Patient identified, Emergency Drugs available, Suction available and Patient being monitored Patient Re-evaluated:Patient Re-evaluated prior to inductionOxygen Delivery Method: Circle system utilized Preoxygenation: Pre-oxygenation with 100% oxygen Intubation Type: IV induction Ventilation: Mask ventilation without difficulty Laryngoscope Size: Mac and 3 Grade View: Grade I Tube type: Oral Tube size: 7.5 mm Number of attempts: 1 Airway Equipment and Method: Stylet Placement Confirmation: ETT inserted through vocal cords under direct vision,  breath sounds checked- equal and bilateral and positive ETCO2 Secured at: 21 (cm at teeth) cm Tube secured with: Tape Dental Injury: Teeth and Oropharynx as per pre-operative assessment

## 2012-05-06 NOTE — OR Nursing (Signed)
Spoke with dr smith/anes re status and cbg 192 now   / o2 sats  ~95 or better on 2lnc----> no add'l treatment per dr Katrinka Blazing and is ok to go to floor

## 2012-05-06 NOTE — Anesthesia Postprocedure Evaluation (Signed)
  Anesthesia Post-op Note  Patient: Mitchell Mccall  Procedure(s) Performed: Procedure(s) (LRB) with comments: LAPAROSCOPIC CHOLECYSTECTOMY WITH INTRAOPERATIVE CHOLANGIOGRAM (N/A) - laparoscopic cholecystectomy with intraoperative cholangiogram  Patient Location: PACU  Anesthesia Type: General  Level of Consciousness: awake, alert , oriented and patient cooperative  Airway and Oxygen Therapy: Patient Spontanous Breathing  Post-op Pain: mild  Post-op Assessment: Post-op Vital signs reviewed, Patient's Cardiovascular Status Stable, Respiratory Function Stable, Patent Airway, No signs of Nausea or vomiting and Pain level controlled  Post-op Vital Signs: stable  Complications: No apparent anesthesia complications

## 2012-05-06 NOTE — Preoperative (Signed)
Beta Blockers   Reason not to administer Beta Blockers:Not Applicable 

## 2012-05-06 NOTE — Anesthesia Preprocedure Evaluation (Signed)
Anesthesia Evaluation  Patient identified by MRN, date of birth, ID band Patient awake    Reviewed: Allergy & Precautions, H&P , NPO status , Patient's Chart, lab work & pertinent test results  Airway Mallampati: I TM Distance: >3 FB Neck ROM: full    Dental   Pulmonary Current Smoker,          Cardiovascular hypertension, Rhythm:regular Rate:Normal     Neuro/Psych Seizures -,  PSYCHIATRIC DISORDERS    GI/Hepatic hiatal hernia, PUD, GERD-  ,(+) Hepatitis -  Endo/Other  diabetes, Poorly Controlled, Type 2  Renal/GU      Musculoskeletal   Abdominal   Peds  Hematology   Anesthesia Other Findings   Reproductive/Obstetrics                           Anesthesia Physical Anesthesia Plan  ASA: III  Anesthesia Plan: General   Post-op Pain Management:    Induction: Intravenous  Airway Management Planned: Oral ETT  Additional Equipment:   Intra-op Plan:   Post-operative Plan: Extubation in OR  Informed Consent: I have reviewed the patients History and Physical, chart, labs and discussed the procedure including the risks, benefits and alternatives for the proposed anesthesia with the patient or authorized representative who has indicated his/her understanding and acceptance.     Plan Discussed with: CRNA, Anesthesiologist and Surgeon  Anesthesia Plan Comments:         Anesthesia Quick Evaluation

## 2012-05-07 LAB — CBC
HCT: 36.2 % — ABNORMAL LOW (ref 39.0–52.0)
MCH: 34.7 pg — ABNORMAL HIGH (ref 26.0–34.0)
MCHC: 33.7 g/dL (ref 30.0–36.0)
MCV: 102.8 fL — ABNORMAL HIGH (ref 78.0–100.0)
Platelets: 124 10*3/uL — ABNORMAL LOW (ref 150–400)
RDW: 12.9 % (ref 11.5–15.5)

## 2012-05-07 LAB — GLUCOSE, CAPILLARY: Glucose-Capillary: 194 mg/dL — ABNORMAL HIGH (ref 70–99)

## 2012-05-07 MED ORDER — OXYCODONE-ACETAMINOPHEN 5-325 MG PO TABS: 1.0000 | ORAL_TABLET | ORAL | Status: AC | PRN

## 2012-05-07 MED ORDER — NICOTINE 7 MG/24HR TD PT24
7.0000 mg | MEDICATED_PATCH | Freq: Every day | TRANSDERMAL | Status: DC
  Administered 2012-05-07: 7 mg via TRANSDERMAL
  Filled 2012-05-07: qty 1

## 2012-05-07 MED ORDER — ACETAMINOPHEN 325 MG PO TABS: 650.0000 mg | ORAL_TABLET | ORAL | Status: DC | PRN

## 2012-05-07 NOTE — Progress Notes (Signed)
Pt CBG 254 at 2130. MD on call, Janee Morn made aware and orders given for sliding scale insulin moderate scale and bedtime coverage. Patient given 3 units of Novolog per sliding scale orders.

## 2012-05-07 NOTE — ED Provider Notes (Signed)
Medical screening examination/treatment/procedure(s) were performed by non-physician practitioner and as supervising physician I was immediately available for consultation/collaboration.  Rembert Skene, M.D.     Egle Skene, MD 05/07/12 1625

## 2012-05-07 NOTE — Progress Notes (Signed)
1 Day Post-Op  Subjective: Sore has not been able to eat much, Very sore from a fall before admission.  His whole left side is bruised up.  Objective: Vital signs in last 24 hours: Temp:  [96.5 F (35.8 C)-98.5 F (36.9 C)] 97.4 F (36.3 C) (09/14 0652) Pulse Rate:  [92-120] 108  (09/14 0700) Resp:  [15-31] 16  (09/14 0652) BP: (102-158)/(58-103) 114/62 mmHg (09/14 0652) SpO2:  [89 %-98 %] 96 % (09/13 1823) Weight:  [54.885 kg (121 lb)] 54.885 kg (121 lb) (09/13 2236) Last BM Date: 05/06/12 Diet Carb modified, Tachycardic HR in the 100's, BP OK, Sat's OK on [place], CBC stable  Intake/Output from previous day: 09/13 0701 - 09/14 0700 In: 3108.8 [I.V.:3108.8] Out: 400 [Urine:400] Intake/Output this shift:    General appearance: alert, cooperative and no distress Resp: clear to auscultation bilaterally GI: soft, very tender, +BS, + flatus.incisions look fine.  Lab Results:   Basename 05/07/12 0500 05/04/12 1826  WBC 7.5 11.2*  HGB 12.2* 16.2  HCT 36.2* 45.8  PLT 124* 232    BMET  Basename 05/04/12 1826  NA 139  K 3.8  CL 93*  CO2 24  GLUCOSE 234*  BUN 10  CREATININE 0.48*  CALCIUM 9.4   PT/INR No results found for this basename: LABPROT:2,INR:2 in the last 72 hours   Lab 05/04/12 1826  AST 36  ALT 19  ALKPHOS 59  BILITOT 0.3  PROT 7.6  ALBUMIN 4.1     Lipase     Component Value Date/Time   LIPASE 60* 05/04/2012 1826     Studies/Results: Dg Cholangiogram Operative  05/06/2012  *RADIOLOGY REPORT*  Clinical Data:   Cholelithiasis  INTRAOPERATIVE CHOLANGIOGRAM  Technique:  Cholangiographic images from the C-arm fluoroscopic device were submitted for interpretation post-operatively.  Please see the procedural report for the amount of contrast and the fluoroscopy time utilized.  Comparison:  None  Findings:  No persistent filling defects in the common duct. Intrahepatic ducts are incompletely visualized, appearing decompressed centrally. Contrast passes into  the duodenum.  IMPRESSION  Negative for retained common duct stone.   Original Report Authenticated By: Osa Craver, M.D.     Medications:    . DULoxetine  20 mg Oral BID  . enoxaparin (LOVENOX) injection  30 mg Subcutaneous Q24H  . folic acid  1 mg Oral Daily  . gabapentin  300 mg Oral BID  . HYDROmorphone      . HYDROmorphone      . insulin aspart  0-15 Units Subcutaneous TID WC  . insulin aspart  0-5 Units Subcutaneous QHS  . lipase/protease/amylase  1-2 capsule Oral QID  . multivitamin with minerals  1 tablet Oral Daily  . ondansetron      . pioglitazone  30 mg Oral Daily  . sodium chloride  3 mL Intravenous Q12H  . spiritus frumenti  1 each Oral Q2000  . thiamine  100 mg Oral Daily   Or  . thiamine  100 mg Intravenous Daily  . DISCONTD: cefOXitin  2 g Intravenous 60 min Pre-Op  . DISCONTD: multivitamins ther. w/minerals  1 tablet Oral Daily  . DISCONTD: Pancrelipase (Lip-Prot-Amyl)  1-2 capsule Oral QID  . DISCONTD: spiritus frumenti  1 each Oral Daily   Prior to Admission medications   Medication Sig Start Date End Date Taking? Authorizing Provider  aspirin 81 MG tablet Take 81 mg by mouth daily.   Yes Historical Provider, MD  DULoxetine (CYMBALTA) 20 MG capsule Take  20 mg by mouth 2 (two) times daily.    Yes Historical Provider, MD  ergocalciferol (VITAMIN D2) 50000 UNITS capsule Take 50,000 Units by mouth once a week. SUNDAY   Yes Historical Provider, MD  gabapentin (NEURONTIN) 300 MG capsule Take 300 mg by mouth 2 (two) times daily.    Yes Historical Provider, MD  HYDROcodone-acetaminophen (NORCO/VICODIN) 5-325 MG per tablet Take 2 tablets by mouth every 4 (four) hours as needed for pain. 05/04/12 05/14/12 Yes Hannah Muthersbaugh, PA-C  insulin detemir (LEVEMIR) 100 UNIT/ML injection Inject 30 Units into the skin 2 (two) times daily.   Yes Historical Provider, MD  l-methylfolate-B6-B12 (METANX) 3-35-2 MG TABS Take 1 tablet by mouth 2 (two) times daily.   Yes  Historical Provider, MD  Multiple Vitamins-Minerals (MULTIVITAMINS THER. W/MINERALS) TABS Take 1 tablet by mouth daily.    Yes Historical Provider, MD  nitroGLYCERIN (NITROSTAT) 0.4 MG SL tablet Place 1 tablet (0.4 mg total) under the tongue every 5 (five) minutes x 3 doses as needed for chest pain (if needed for severe chest pain/pressure). 08/29/11 08/28/12 Yes Luke K Kilroy, PA  ondansetron (ZOFRAN ODT) 4 MG disintegrating tablet Take 1 tablet (4 mg total) by mouth every 8 (eight) hours as needed for nausea. 05/04/12 05/11/12 Yes Hannah Muthersbaugh, PA-C  oxyCODONE (OXY IR/ROXICODONE) 5 MG immediate release tablet Take 1 tablet (5 mg total) by mouth every 6 (six) hours as needed for pain. For pain 11/13/11  Yes Srikar Cherlynn Kaiser, MD  Pancrelipase, Lip-Prot-Amyl, (ZENPEP) 25000 UNITS CPEP Take 1-2 capsules by mouth 4 (four) times daily. 2 capsules with each meal and 1 capsule with a snack   Yes Historical Provider, MD  pioglitazone (ACTOS) 30 MG tablet Take 30 mg by mouth daily.    Yes Historical Provider, MD    Assessment/Plan Calculus of gallbladder with other cholecystitis, without mention of obstruction s/p Lap cholecystectomy with IOC, 05/06/2012 Dr. Andrey Campanile Tobacco use  ETOH use (12 pk per week)  Chronic Pancreatitis/pancreatic pseudocyst Patient Active Problem List  Diagnosis  . Claudication  . PVD (peripheral vascular disease), RCIA PTA 11/12  . Pseudoaneurysm of femoral artery, Lt. with closure /Thrombin injection.  . Aneurysm of unspecified site  . Aneurysm of artery of lower extremity  . Epigastric pain  . Peripheral vascular disease  . GERD (gastroesophageal reflux disease)  . Seizures  . Hypertension  . Depression  . Hepatitis  . Anemia  . Pancreatitis, alcoholic  . DTs (delirium tremens)  . Pancreatic pseudocyst  . DM (diabetes mellitus), type 2, uncontrolled with complications  . Hyponatremia  . Biliary colic  . Biliary dyskinesia   Plan:  Advance diet, nicotine patch,  mobilize, hope to send home later today.  He says he's ready to go home now.  Will discharge this AM.      LOS: 1 day    Vincen Bejar 05/07/2012

## 2012-05-09 ENCOUNTER — Encounter (HOSPITAL_COMMUNITY): Payer: Self-pay | Admitting: General Surgery

## 2012-05-09 DIAGNOSIS — K860 Alcohol-induced chronic pancreatitis: Secondary | ICD-10-CM | POA: Diagnosis present

## 2012-05-09 DIAGNOSIS — F1020 Alcohol dependence, uncomplicated: Secondary | ICD-10-CM | POA: Diagnosis present

## 2012-05-09 NOTE — Discharge Summary (Signed)
Physician Discharge Summary  Patient ID: Mitchell Mccall MRN: [id number] DOB/AGE: June 29, 1951 61 y.o.  Admit date: 05/06/2012 Discharge date: 05/07/2012  Admission Diagnoses: Patient Active Problem List  Diagnosis  . Claudication  . PVD (peripheral vascular disease), RCIA PTA 11/12  . Pseudoaneurysm of femoral artery, Lt. with closure /Thrombin injection.  . Aneurysm of unspecified site  . Aneurysm of artery of lower extremity  . Epigastric pain  . Peripheral vascular disease  . GERD (gastroesophageal reflux disease)  . Seizures  . Hypertension  . Depression  . Hepatitis  . Anemia  . Pancreatitis, alcoholic  . DTs (delirium tremens)  . Pancreatic pseudocyst  . DM (diabetes mellitus), type 2, uncontrolled with complications  . Hyponatremia  . Biliary dyskinesia  . Chronic alcohol dependence, continuous  . Chronic alcoholic pancreatitis    Discharge Diagnoses:  Active Problems:  GERD (gastroesophageal reflux disease)  Hypertension  DM (diabetes mellitus), type 2, uncontrolled with complications  Biliary dyskinesia  Chronic alcohol dependence, continuous  Chronic alcoholic pancreatitis   Discharged Condition: fair  Hospital Course: 61 year old Caucasian male was taken to the operating room for a scheduled elective laparoscopic cholecystectomy with interoperative cholangiogram due to biliary dyskinesia. He has a chronic history of abdominal pain and has known chronic pancreatitis and pancreatic pseudocysts mainly due to alcohol. He was seen in the emergency room by the ER physician 2 days prior to surgery for ongoing epigastric abdominal pain along with nausea. While being interviewed by the ER physician he was noticed to have a petite mall seizure. He recovered without any sequela and was discharged home from the ER. The patient does not recall that event. I spoke with his wife prior surgery. I again explained that the patient was higher risk for surgery compared to an average  individual along with higher chance of postoperative complications given his medical history. She elected for [place] to proceed to the operating room. Of note during his preoperative evaluation, he was found to be on Plavix. However his primary care physician nor his cardiologist  Knew why he was placed on that medication and we were told we could discontinue it. He underwent an uneventful laparoscopic cholecystectomy with interoperative cholangiogram. He had a small focal dilatation of his common hepatic duct suggestive of a choledochocyst. He was kept overnight for observation. On the morning of discharge, his vital signs are stable. He was ambulating without difficulty. His pain was controlled. He was tolerating a diet. Postoperatively he had been placed on alcohol withdrawal protocol and he was given a Beer after dinner. He was deemed stable for discharge to home.  Consults: None  Significant Diagnostic Studies: labs: cbc  Treatments: IV hydration, analgesia: Morphine and percocet and surgery: LAPAROSCOPIC CHOLECYSTECTOMY WITH IOC 05/06/12  Discharge Exam: Blood pressure 114/62, pulse 108, temperature 97.4 F (36.3 C), temperature source Oral, resp. rate 16, height 5\' 9"  (1.753 m), weight 121 lb (54.885 kg), SpO2 96.00%. Alert, nad cta ant Reg Soft, nd, incision c/d/i. Mild expected TTP. No edema. +SCD  Disposition: 01-Home or Self Care     Medication List     As of 05/09/2012 12:35 PM    STOP taking these medications         clopidogrel 75 MG tablet   Commonly known as: PLAVIX      TAKE these medications         acetaminophen 325 MG tablet   Commonly known as: TYLENOL   Take 2 tablets (650 mg total) by mouth every 4 (  four) hours as needed (or Fever >/= 101).      aspirin 81 MG tablet   Take 81 mg by mouth daily.      DULoxetine 20 MG capsule   Commonly known as: CYMBALTA   Take 20 mg by mouth 2 (two) times daily.      ergocalciferol 50000 UNITS capsule   Commonly known  as: VITAMIN D2   Take 50,000 Units by mouth once a week. SUNDAY      gabapentin 300 MG capsule   Commonly known as: NEURONTIN   Take 300 mg by mouth 2 (two) times daily.      HYDROcodone-acetaminophen 5-325 MG per tablet   Commonly known as: NORCO/VICODIN   Take 2 tablets by mouth every 4 (four) hours as needed for pain.      insulin detemir 100 UNIT/ML injection   Commonly known as: LEVEMIR   Inject 30 Units into the skin 2 (two) times daily.      l-methylfolate-B6-B12 3-35-2 MG Tabs   Commonly known as: METANX   Take 1 tablet by mouth 2 (two) times daily.      multivitamins ther. w/minerals Tabs   Take 1 tablet by mouth daily.      nitroGLYCERIN 0.4 MG SL tablet   Commonly known as: NITROSTAT   Place 1 tablet (0.4 mg total) under the tongue every 5 (five) minutes x 3 doses as needed for chest pain (if needed for severe chest pain/pressure).      ondansetron 4 MG disintegrating tablet   Commonly known as: ZOFRAN-ODT   Take 1 tablet (4 mg total) by mouth every 8 (eight) hours as needed for nausea.      oxyCODONE 5 MG immediate release tablet   Commonly known as: Oxy IR/ROXICODONE   Take 1 tablet (5 mg total) by mouth every 6 (six) hours as needed for pain. For pain      oxyCODONE-acetaminophen 5-325 MG per tablet   Commonly known as: PERCOCET/ROXICET   Take 1-2 tablets by mouth every 4 (four) hours as needed.      pioglitazone 30 MG tablet   Commonly known as: ACTOS   Take 30 mg by mouth daily.      ZENPEP 25000 UNITS Cpep   Generic drug: Pancrelipase (Lip-Prot-Amyl)   Take 1-2 capsules by mouth 4 (four) times daily. 2 capsules with each meal and 1 capsule with a snack           Follow-up Information    Follow up with Atilano Ina, MD,FACS. Schedule an appointment as soon as possible for a visit in 3 weeks.   Contact information:   [address] [place] [address] [phone]       Schedule an appointment as soon as possible for a visit with  [name], MD. (Call for all medical issues. as needed)    Contact information:   [address] [place] [address] [phone]          Signed: Atilano Ina 05/09/2012, 12:35 PM

## 2012-05-27 ENCOUNTER — Ambulatory Visit (INDEPENDENT_AMBULATORY_CARE_PROVIDER_SITE_OTHER): Payer: Medicare Other | Admitting: General Surgery

## 2012-05-27 ENCOUNTER — Encounter (INDEPENDENT_AMBULATORY_CARE_PROVIDER_SITE_OTHER): Payer: Self-pay | Admitting: General Surgery

## 2012-05-27 ENCOUNTER — Telehealth (INDEPENDENT_AMBULATORY_CARE_PROVIDER_SITE_OTHER): Payer: Self-pay

## 2012-05-27 VITALS — BP 118/64 | HR 108 | Temp 97.3°F | Resp 16 | Ht 69.0 in | Wt 117.8 lb

## 2012-05-27 DIAGNOSIS — Z9049 Acquired absence of other specified parts of digestive tract: Secondary | ICD-10-CM

## 2012-05-27 DIAGNOSIS — R1011 Right upper quadrant pain: Secondary | ICD-10-CM

## 2012-05-27 DIAGNOSIS — Z9089 Acquired absence of other organs: Secondary | ICD-10-CM

## 2012-05-27 DIAGNOSIS — R1110 Vomiting, unspecified: Secondary | ICD-10-CM

## 2012-05-27 LAB — COMPREHENSIVE METABOLIC PANEL
Albumin: 3.7 g/dL (ref 3.5–5.2)
BUN: 6 mg/dL (ref 6–23)
CO2: 21 mEq/L (ref 19–32)
Calcium: 9.1 mg/dL (ref 8.4–10.5)
Chloride: 95 mEq/L — ABNORMAL LOW (ref 96–112)
Creat: 0.53 mg/dL (ref 0.50–1.35)
Glucose, Bld: 217 mg/dL — ABNORMAL HIGH (ref 70–99)
Potassium: 4.1 mEq/L (ref 3.5–5.3)

## 2012-05-27 LAB — CBC WITH DIFFERENTIAL/PLATELET
Basophils Absolute: 0 10*3/uL (ref 0.0–0.1)
Eosinophils Relative: 0 % (ref 0–5)
HCT: 43.3 % (ref 39.0–52.0)
Hemoglobin: 14.7 g/dL (ref 13.0–17.0)
Lymphocytes Relative: 32 % (ref 12–46)
Lymphs Abs: 2.8 10*3/uL (ref 0.7–4.0)
MCV: 106.4 fL — ABNORMAL HIGH (ref 78.0–100.0)
Monocytes Absolute: 0.7 10*3/uL (ref 0.1–1.0)
Monocytes Relative: 8 % (ref 3–12)
Neutro Abs: 5.4 10*3/uL (ref 1.7–7.7)
RDW: 14.3 % (ref 11.5–15.5)
WBC: 9 10*3/uL (ref 4.0–10.5)

## 2012-05-27 MED ORDER — HYDROCODONE-ACETAMINOPHEN 5-325 MG PO TABS: 1.0000 | ORAL_TABLET | Freq: Four times a day (QID) | ORAL | Status: DC | PRN

## 2012-05-27 NOTE — Patient Instructions (Signed)
Get your labs drawn please 

## 2012-05-27 NOTE — Telephone Encounter (Signed)
Pt's wife called stating pt was seen this morning for post op and forgot to ask for more pain medication. I advised her that I will send a msg to Dr Andrey Campanile to review. Pharmacy is [facility]. Pt's wife can be reached at [phone].

## 2012-05-27 NOTE — Telephone Encounter (Signed)
Ok per Dr Andrey Campanile to call in Norco 5/325 #30 per protocol. Wife aware. RX called to [facility].

## 2012-05-27 NOTE — Progress Notes (Signed)
Subjective:     Patient ID: Mitchell Mccall, male   DOB: [birth year], 61 y.o.   MRN: [id number]  HPI 61 year old Caucasian male comes in for followup after undergoing laparoscopic cholecystectomy with interoperative angiogram on September 13 for right upper quadrant pain and chronic cholecystitis. He was kept in the hospital overnight for observation. He states since discharge she still has had some right-sided abdominal pain. He also complains of abdominal spasms. He had one episode of vomiting last night after drinking some Ensure. He states the pain on the right-sided has gotten better but it still persisted. He states that he can't sleep for more than 3-4 hours at a time without taking a pain pill.he states that he is drinking about 3-4 inch or so a day. He states that the spasms were present before surgery. He denies any fevers or chills. He denies any diarrhea or constipation.  Review of Systems     Objective:   Physical Exam BP 118/64  Pulse 108  Temp 97.3 F (36.3 C) (Temporal)  Resp 16  Ht 5\' 9"  (1.753 m)  Wt 117 lb 12.8 oz (53.434 kg)  BMI 17.40 kg/m2  Gen: alert, NAD, non-toxic appearing, older than stated age, walks with cane Pupils: equal, no scleral icterus Pulm: Lungs clear to auscultation, symmetric chest rise CV: regular rate and rhythm Abd: soft, nondistended. Well-healed trocar sites. No cellulitis. No incisional hernia, RUQ TTP. No RT/guarding Ext: no edema, no calf tenderness Skin: no rash, no jaundice     Assessment:     S/p Lap cholecystectomy with [facility] 9/13    Plan:     We discussed his surgical pathology report which showed mild chronic cholecystitis. My suspicion for postoperative complication is low. However I will check a CMET and CBC. He is nontoxic appearing and has stable vital signs. There is no signs of scleral icterus or jaundice. As I discussed with him and his wife preoperatively he has many reasons to have abdominal pain such as his chronic  pancreatitis with pseudocysts. As we discussed preoperatively cholecystectomy would probably not ameliorate all of his abdominal pains. Followup in 6 weeks or sooner depending on lab results.  Mary Sella. Andrey Campanile, MD, FACS General, Bariatric, & Minimally Invasive Surgery [facility], [place]

## 2012-05-27 NOTE — Addendum Note (Signed)
Addended byLiliana Cline on: 05/27/2012 03:55 PM   Modules accepted: Orders

## 2012-06-01 ENCOUNTER — Telehealth (INDEPENDENT_AMBULATORY_CARE_PROVIDER_SITE_OTHER): Payer: Self-pay | Admitting: General Surgery

## 2012-06-01 DIAGNOSIS — G8918 Other acute postprocedural pain: Secondary | ICD-10-CM

## 2012-06-01 DIAGNOSIS — R110 Nausea: Secondary | ICD-10-CM

## 2012-06-01 NOTE — Telephone Encounter (Signed)
Patient's wife made aware lab work okay. To have repeated next week. Patient's wife aware. Order placed.

## 2012-06-01 NOTE — Telephone Encounter (Signed)
Left message on machine for patient to call back and ask for me.   

## 2012-06-01 NOTE — Telephone Encounter (Signed)
Message copied by Liliana Cline on Wed Jun 01, 2012  8:32 AM ------      Message from: [place], ERIC M      Created: Tue May 31, 2012  6:30 PM       Labs are typical. I would like to repeat his CMET in 1 week. Sooner if symptoms worsen

## 2012-06-09 ENCOUNTER — Encounter (INDEPENDENT_AMBULATORY_CARE_PROVIDER_SITE_OTHER): Payer: Medicare Other | Admitting: General Surgery

## 2012-06-10 ENCOUNTER — Telehealth (INDEPENDENT_AMBULATORY_CARE_PROVIDER_SITE_OTHER): Payer: Self-pay | Admitting: General Surgery

## 2012-06-10 NOTE — Telephone Encounter (Signed)
Message copied by Liliana Cline on Fri Jun 10, 2012  9:23 AM ------      Message from: Andrey Campanile, ERIC M      Created: Fri Jun 10, 2012  8:47 AM       What's going on with this guy's repeat CMET. thanks

## 2012-06-10 NOTE — Telephone Encounter (Signed)
Left message on machine for patient to call back and ask for me. Calling to see if patient had labs drawn on Wednesday as directed. Awaiting call back.

## 2012-06-10 NOTE — Telephone Encounter (Signed)
Spoke with patient's wife who states they are going for lab work today. I will contact them once we have results.

## 2012-06-13 LAB — COMPREHENSIVE METABOLIC PANEL
ALT: 34 U/L (ref 0–53)
AST: 38 U/L — ABNORMAL HIGH (ref 0–37)
CO2: 28 mEq/L (ref 19–32)
Calcium: 9.6 mg/dL (ref 8.4–10.5)
Chloride: 94 mEq/L — ABNORMAL LOW (ref 96–112)
Creat: 0.55 mg/dL (ref 0.50–1.35)
Sodium: 137 mEq/L (ref 135–145)
Total Protein: 7.1 g/dL (ref 6.0–8.3)

## 2012-06-14 ENCOUNTER — Other Ambulatory Visit (INDEPENDENT_AMBULATORY_CARE_PROVIDER_SITE_OTHER): Payer: Self-pay | Admitting: General Surgery

## 2012-06-14 ENCOUNTER — Telehealth (INDEPENDENT_AMBULATORY_CARE_PROVIDER_SITE_OTHER): Payer: Self-pay | Admitting: General Surgery

## 2012-06-14 NOTE — Telephone Encounter (Signed)
Message copied by Liliana Cline on Tue Jun 14, 2012  9:26 AM ------      Message from: Andrey Campanile, ERIC M      Created: Tue Jun 14, 2012  9:18 AM       Labs are ok and improving except for his blood glucose

## 2012-06-14 NOTE — Telephone Encounter (Signed)
Left message on machine for patient to call back and ask for me. Patient's labs are better except for his glucose. He needs to follow up with his PCP about getting his glucose under control.

## 2012-06-14 NOTE — Telephone Encounter (Signed)
Ok to refill 

## 2012-06-15 NOTE — Telephone Encounter (Signed)
Hydrocodone #30 with no refills called to pharmacy per patient's request.

## 2012-06-15 NOTE — Telephone Encounter (Signed)
This will be his last refill from me

## 2012-06-15 NOTE — Telephone Encounter (Signed)
Patients wife aware

## 2012-06-15 NOTE — Telephone Encounter (Signed)
Left another message on machine for patient to call back and ask for me. Need to make him aware labs ok except for glucose and refill of pain medicine called to pharmacy.

## 2012-07-14 ENCOUNTER — Encounter (INDEPENDENT_AMBULATORY_CARE_PROVIDER_SITE_OTHER): Payer: Medicare Other | Admitting: General Surgery

## 2012-08-04 ENCOUNTER — Other Ambulatory Visit (HOSPITAL_COMMUNITY): Payer: Self-pay | Admitting: Cardiovascular Disease

## 2012-08-04 ENCOUNTER — Other Ambulatory Visit (HOSPITAL_COMMUNITY): Payer: Self-pay | Admitting: General Practice

## 2012-08-04 ENCOUNTER — Encounter (INDEPENDENT_AMBULATORY_CARE_PROVIDER_SITE_OTHER): Payer: Self-pay | Admitting: General Surgery

## 2012-08-04 DIAGNOSIS — R0602 Shortness of breath: Secondary | ICD-10-CM

## 2012-08-04 DIAGNOSIS — R634 Abnormal weight loss: Secondary | ICD-10-CM

## 2012-08-04 DIAGNOSIS — Z720 Tobacco use: Secondary | ICD-10-CM

## 2012-08-19 ENCOUNTER — Ambulatory Visit (HOSPITAL_COMMUNITY): Payer: Medicare Other

## 2012-09-15 ENCOUNTER — Ambulatory Visit (INDEPENDENT_AMBULATORY_CARE_PROVIDER_SITE_OTHER): Payer: Medicare Other | Admitting: General Surgery

## 2012-09-15 ENCOUNTER — Encounter (INDEPENDENT_AMBULATORY_CARE_PROVIDER_SITE_OTHER): Payer: Self-pay | Admitting: General Surgery

## 2012-09-15 VITALS — BP 136/70 | HR 108 | Resp 20 | Ht 68.0 in | Wt 115.0 lb

## 2012-09-15 DIAGNOSIS — K861 Other chronic pancreatitis: Secondary | ICD-10-CM

## 2012-09-15 NOTE — Progress Notes (Signed)
Subjective:     Patient ID: Mitchell Mccall, male   DOB: 1950/09/12, 62 y.o.   MRN: [id number]  HPI 62 year old gentleman comes in for long-term followup after undergoing laparoscopic cholecystectomy on 05/06/2012 for chronic cholecystitis and biliary dyskinesia. Patient has a history of chronic pancreatitis with pancreatic pseudocyst. He was last in the office on October 4. After that visit we checked a set of labs. His alkaline phosphatase level was 132 down from 230. His AST was mildly elevated at 38. His bilirubin was normal.  He states that he continues to have daily constant abdominal pain. It occurs both on his right and left sides. He also has fairly frequent episodes of nausea. On occasion the abdominal pain will become quite severe. His appetite is all right he states. He reports daily bowel movements. He denies any fevers or chills. His wife states that his blood sugar got really low the other night. He states that he will have a beer on occasion. He states that he has been belching quite a lot lately.  PMHx, PSHx, SOCHx, FAMHx, ALL reviewed   Review of Systems 8 point ROS performed and negative except for above    Objective:   Physical Exam BP 136/70  Pulse 108  Resp 20  Ht 5\' 8"  (1.727 m)  Wt 115 lb (52.164 kg)  BMI 17.49 kg/m2  Gen: alert, NAD, non-toxic appearing, thin, cachetic Pupils: equal, no scleral icterus Pulm: Lungs clear to auscultation, symmetric chest rise CV: regular rate and rhythm Abd: soft,  nondistended. Well-healed trocar sites. No cellulitis. No incisional hernia. Mild rt and lt abd ttp. Ext: no edema, no calf tenderness Skin: no rash, no jaundice     Assessment:     Status post laparoscopic cholecystectomy for chronic cholecystitis and biliary dyskinesia    Plan:     I do not believe the patient has had a complication from his cholecystectomy. I believe this is his underlying pancreatitis and pancreatic pseudocyst issues. Interestingly the patient  was belching continuously throughout the visit. Unfortunately I do not have anything else to offer the patient. I explained to him and his wife preoperatively I doubted that a cholecystectomy would ameliorate abdominal pain and they understood this going into the procedure. I encouraged him to contact Dr. Hulen Shouts office to schedule followup appointment. F/u PRN  Mary Sella. Andrey Campanile, MD, FACS General, Bariatric, & Minimally Invasive Surgery [facility], [place]

## 2012-09-15 NOTE — Patient Instructions (Signed)
Please call and make an appt to see Dr Dulce Sellar

## 2012-10-27 ENCOUNTER — Other Ambulatory Visit: Payer: Self-pay | Admitting: Gastroenterology

## 2012-10-27 DIAGNOSIS — R109 Unspecified abdominal pain: Secondary | ICD-10-CM

## 2012-10-27 DIAGNOSIS — R634 Abnormal weight loss: Secondary | ICD-10-CM

## 2012-10-27 DIAGNOSIS — Z8719 Personal history of other diseases of the digestive system: Secondary | ICD-10-CM

## 2012-11-03 ENCOUNTER — Ambulatory Visit
Admission: RE | Admit: 2012-11-03 | Discharge: 2012-11-03 | Disposition: A | Discharge status: Home / Self Care | Payer: Medicare Other | Source: Ambulatory Visit | Attending: Gastroenterology | Admitting: Gastroenterology

## 2012-11-03 DIAGNOSIS — R634 Abnormal weight loss: Secondary | ICD-10-CM

## 2012-11-03 DIAGNOSIS — R109 Unspecified abdominal pain: Secondary | ICD-10-CM

## 2012-11-03 DIAGNOSIS — Z8719 Personal history of other diseases of the digestive system: Secondary | ICD-10-CM

## 2012-11-03 MED ORDER — IOHEXOL 350 MG/ML SOLN
100.0000 mL | Freq: Once | INTRAVENOUS | Status: AC | PRN
  Administered 2012-11-03: 100 mL via INTRAVENOUS

## 2012-12-11 ENCOUNTER — Emergency Department (HOSPITAL_COMMUNITY): Payer: Medicare Other

## 2012-12-11 ENCOUNTER — Encounter (HOSPITAL_COMMUNITY): Payer: Self-pay | Admitting: *Deleted

## 2012-12-11 ENCOUNTER — Emergency Department (HOSPITAL_COMMUNITY)
Admission: EM | Admit: 2012-12-11 | Discharge: 2012-12-11 | Disposition: A | Discharge status: Home Health | Payer: Medicare Other | Attending: Emergency Medicine | Admitting: Emergency Medicine

## 2012-12-11 DIAGNOSIS — Z860100 Personal history of colon polyps, unspecified: Secondary | ICD-10-CM | POA: Insufficient documentation

## 2012-12-11 DIAGNOSIS — G8929 Other chronic pain: Secondary | ICD-10-CM | POA: Insufficient documentation

## 2012-12-11 DIAGNOSIS — I10 Essential (primary) hypertension: Secondary | ICD-10-CM | POA: Insufficient documentation

## 2012-12-11 DIAGNOSIS — F411 Generalized anxiety disorder: Secondary | ICD-10-CM | POA: Insufficient documentation

## 2012-12-11 DIAGNOSIS — Z794 Long term (current) use of insulin: Secondary | ICD-10-CM | POA: Insufficient documentation

## 2012-12-11 DIAGNOSIS — S42202A Unspecified fracture of upper end of left humerus, initial encounter for closed fracture: Secondary | ICD-10-CM

## 2012-12-11 DIAGNOSIS — M545 Low back pain, unspecified: Secondary | ICD-10-CM | POA: Insufficient documentation

## 2012-12-11 DIAGNOSIS — S42209A Unspecified fracture of upper end of unspecified humerus, initial encounter for closed fracture: Secondary | ICD-10-CM | POA: Insufficient documentation

## 2012-12-11 DIAGNOSIS — Z8679 Personal history of other diseases of the circulatory system: Secondary | ICD-10-CM | POA: Insufficient documentation

## 2012-12-11 DIAGNOSIS — Z862 Personal history of diseases of the blood and blood-forming organs and certain disorders involving the immune mechanism: Secondary | ICD-10-CM | POA: Insufficient documentation

## 2012-12-11 DIAGNOSIS — Z7982 Long term (current) use of aspirin: Secondary | ICD-10-CM | POA: Insufficient documentation

## 2012-12-11 DIAGNOSIS — Y9389 Activity, other specified: Secondary | ICD-10-CM | POA: Insufficient documentation

## 2012-12-11 DIAGNOSIS — F3289 Other specified depressive episodes: Secondary | ICD-10-CM | POA: Insufficient documentation

## 2012-12-11 DIAGNOSIS — E559 Vitamin D deficiency, unspecified: Secondary | ICD-10-CM | POA: Insufficient documentation

## 2012-12-11 DIAGNOSIS — W2209XA Striking against other stationary object, initial encounter: Secondary | ICD-10-CM | POA: Insufficient documentation

## 2012-12-11 DIAGNOSIS — E119 Type 2 diabetes mellitus without complications: Secondary | ICD-10-CM | POA: Insufficient documentation

## 2012-12-11 DIAGNOSIS — Y929 Unspecified place or not applicable: Secondary | ICD-10-CM | POA: Insufficient documentation

## 2012-12-11 DIAGNOSIS — S0990XA Unspecified injury of head, initial encounter: Secondary | ICD-10-CM | POA: Insufficient documentation

## 2012-12-11 DIAGNOSIS — F329 Major depressive disorder, single episode, unspecified: Secondary | ICD-10-CM | POA: Insufficient documentation

## 2012-12-11 DIAGNOSIS — R079 Chest pain, unspecified: Secondary | ICD-10-CM

## 2012-12-11 DIAGNOSIS — Z8669 Personal history of other diseases of the nervous system and sense organs: Secondary | ICD-10-CM | POA: Insufficient documentation

## 2012-12-11 DIAGNOSIS — R0602 Shortness of breath: Secondary | ICD-10-CM | POA: Insufficient documentation

## 2012-12-11 DIAGNOSIS — Z8701 Personal history of pneumonia (recurrent): Secondary | ICD-10-CM | POA: Insufficient documentation

## 2012-12-11 DIAGNOSIS — Z8719 Personal history of other diseases of the digestive system: Secondary | ICD-10-CM | POA: Insufficient documentation

## 2012-12-11 DIAGNOSIS — F17200 Nicotine dependence, unspecified, uncomplicated: Secondary | ICD-10-CM | POA: Insufficient documentation

## 2012-12-11 DIAGNOSIS — Z8781 Personal history of (healed) traumatic fracture: Secondary | ICD-10-CM | POA: Insufficient documentation

## 2012-12-11 DIAGNOSIS — Z79899 Other long term (current) drug therapy: Secondary | ICD-10-CM | POA: Insufficient documentation

## 2012-12-11 DIAGNOSIS — Z8639 Personal history of other endocrine, nutritional and metabolic disease: Secondary | ICD-10-CM | POA: Insufficient documentation

## 2012-12-11 LAB — D-DIMER, QUANTITATIVE: D-Dimer, Quant: 3.4 ug/mL-FEU — ABNORMAL HIGH (ref 0.00–0.48)

## 2012-12-11 LAB — CBC WITH DIFFERENTIAL/PLATELET
Basophils Absolute: 0 10*3/uL (ref 0.0–0.1)
Basophils Relative: 0 % (ref 0–1)
Eosinophils Relative: 1 % (ref 0–5)
HCT: 33.8 % — ABNORMAL LOW (ref 39.0–52.0)
Lymphocytes Relative: 38 % (ref 12–46)
MCHC: 36.7 g/dL — ABNORMAL HIGH (ref 30.0–36.0)
MCV: 94.2 fL (ref 78.0–100.0)
Monocytes Absolute: 0.9 10*3/uL (ref 0.1–1.0)
Neutro Abs: 5.3 10*3/uL (ref 1.7–7.7)
Platelets: 174 10*3/uL (ref 150–400)
RDW: 12.5 % (ref 11.5–15.5)
WBC: 10.1 10*3/uL (ref 4.0–10.5)

## 2012-12-11 LAB — COMPREHENSIVE METABOLIC PANEL
ALT: 113 U/L — ABNORMAL HIGH (ref 0–53)
AST: 172 U/L — ABNORMAL HIGH (ref 0–37)
Albumin: 3.5 g/dL (ref 3.5–5.2)
Calcium: 9.7 mg/dL (ref 8.4–10.5)
Creatinine, Ser: 0.42 mg/dL — ABNORMAL LOW (ref 0.50–1.35)
Sodium: 133 mEq/L — ABNORMAL LOW (ref 135–145)

## 2012-12-11 LAB — LIPASE, BLOOD: Lipase: 28 U/L (ref 11–59)

## 2012-12-11 LAB — TROPONIN I: Troponin I: <0.3 ng/mL (ref <0.30)

## 2012-12-11 MED ORDER — SODIUM CHLORIDE 0.9 % IV BOLUS (SEPSIS)
500.0000 mL | Freq: Once | INTRAVENOUS | Status: AC
  Administered 2012-12-11: 500 mL via INTRAVENOUS

## 2012-12-11 MED ORDER — MORPHINE SULFATE 4 MG/ML IJ SOLN
4.0000 mg | Freq: Once | INTRAMUSCULAR | Status: AC
  Administered 2012-12-11: 4 mg via INTRAVENOUS
  Filled 2012-12-11: qty 1

## 2012-12-11 MED ORDER — OXYCODONE HCL 5 MG PO TABS: 5.0000 mg | ORAL_TABLET | ORAL | Status: DC | PRN

## 2012-12-11 MED ORDER — ONDANSETRON HCL 4 MG/2ML IJ SOLN
4.0000 mg | Freq: Once | INTRAMUSCULAR | Status: AC
  Administered 2012-12-11: 4 mg via INTRAVENOUS
  Filled 2012-12-11: qty 2

## 2012-12-11 MED ORDER — ASPIRIN 81 MG PO CHEW
324.0000 mg | CHEWABLE_TABLET | Freq: Once | ORAL | Status: AC
  Administered 2012-12-11: 324 mg via ORAL
  Filled 2012-12-11: qty 4

## 2012-12-11 MED ORDER — OXYCODONE HCL 5 MG PO TABS
10.0000 mg | ORAL_TABLET | Freq: Once | ORAL | Status: AC
  Administered 2012-12-11: 10 mg via ORAL
  Filled 2012-12-11: qty 2

## 2012-12-11 MED ORDER — IOHEXOL 350 MG/ML SOLN
55.0000 mL | Freq: Once | INTRAVENOUS | Status: AC | PRN
  Administered 2012-12-11: 55 mL via INTRAVENOUS

## 2012-12-11 NOTE — ED Notes (Signed)
The pt was walking down in the hallway at home  He had some chest pain and fell striking his lt shoulder.  C/o the lt shoulder with sl deformity.  Alert no distress

## 2012-12-11 NOTE — ED Notes (Signed)
Pt discharged to home with family. NAD.  

## 2012-12-11 NOTE — ED Provider Notes (Signed)
Patient seen/examined in the Emergency Department in conjunction with Midlevel Provider [place] Patient reports left arm pain and CP Exam : he has humerus fracture (he is neurovascularly intact in left UE) and reports CP was worse with deep breathing Plan: will need immobilizer for humerus fracture, will further evaluate CP with d-dimer to evaluate for PE.  Initial EKG shows sinus tachycardia   Joya Gaskins, MD 12/11/12 [phone]

## 2012-12-11 NOTE — ED Provider Notes (Signed)
History     CSN: [id number]  Arrival date & time 12/11/12  0614   First MD Initiated Contact with Patient 12/11/12 [phone]      Chief Complaint  Patient presents with  . Chest Pain    (Consider location/radiation/quality/duration/timing/severity/associated sxs/prior treatment) HPI Comments: Patient with history of peripheral vascular disease, pancreatitis, no known CAD -- presents with complaint of left chest pain and shoulder pain. Patient had an episode of sharp stabbing left chest pain at approximately 1 AM. This episode did not radiate and lasted for only a few seconds. The pain was severe and caused the patient to fall. He struck his left forehead and left shoulder on a wall. He then went to sleep. He awoke lying in bed and had another episode of chest pain similar in character induration. He states that this returned several times. He had shortness of breath with the chest pain that is now improved but not gone. No nausea or vomiting. Patient took tramadol and nitroglycerin. He is unsure if these helped. Patient did not take any aspirin. Onset of symptoms acute. Course is improved. Nothing makes symptoms better or worse.  Patient is a 62 y.o. male presenting with chest pain. The history is provided by the patient.  Chest Pain Associated symptoms: shortness of breath   Associated symptoms: no abdominal pain, no back pain, no cough, no diaphoresis, no fever, no nausea, no palpitations and not vomiting     Past Medical History  Diagnosis Date  . Peripheral vascular disease   . Diabetes mellitus   . GERD (gastroesophageal reflux disease)   . Stomach ulcer   . Hypertension   . Anxiety   . Depression   . Hepatitis   . H/O hiatal hernia   . Anemia   . Blood transfusion   . Undescended testicle   . Pneumonia DEC 2003  . Colon polyps   . Chronic lower back pain   . Subdural hematoma JULY 2006  . Duodenitis   . Esophagitis   . Coarse tremors   . Diverticulosis   . Rib fracture  DEC 2005    Right sided from a fall  . Rectal bleed JULY 2004  . Vitamin D deficiency   . Macrocytosis   . DTs (delirium tremens)   . Claudication in peripheral vascular disease   . Pancreatitis, alcoholic   . Fatty liver, alcoholic   . Hemorrhoids   . 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 Pancreatic pseudocyst   . Blood dyscrasia     macrocytosis  . Seizures     1-2 yrs ago ? cause    Past Surgical History  Procedure Laterality Date  . Vascular surgery    . Ligament repair  1955    left leg   . False aneurysm repair  09/16/2011    Procedure: REPAIR FALSE ANEURYSM;  Surgeon: Larina Earthly, MD;  Location: [facility] OR;  Service: Vascular;  Laterality: Left;  . Incision and drainage of wound      groin  . Eus  01/20/2012    Procedure: ESOPHAGEAL ENDOSCOPIC ULTRASOUND (EUS) RADIAL;  Surgeon: Willis Modena, MD;  Location: [facility] ENDOSCOPY;  Service: Endoscopy;  Laterality: N/A;  . Fine needle aspiration  01/20/2012    Procedure: FINE NEEDLE ASPIRATION (FNA) LINEAR;  Surgeon: Willis Modena, MD;  Location: [facility] ENDOSCOPY;  Service: Endoscopy;  Laterality: N/A;  . Cholecystectomy  05/06/2012    Procedure: LAPAROSCOPIC CHOLECYSTECTOMY WITH INTRAOPERATIVE CHOLANGIOGRAM;  Surgeon: Minerva Areola  Elson Clan, MD,FACS;  Location: [facility] OR;  Service: General;  Laterality: N/A;  laparoscopic cholecystectomy with intraoperative cholangiogram    Family History  Problem Relation Age of Onset  . Diabetes Father   . Coronary artery disease Mother     History  Substance Use Topics  . Smoking status: Current Every Day Smoker -- 1.00 packs/day for 50 years    Types: Cigarettes  . Smokeless tobacco: Never Used  . Alcohol Use: 7.2 oz/week    12 Cans of beer per week     Comment: 6 pack of beer per week      Review of Systems  Constitutional: Negative for fever and diaphoresis.  HENT: Negative for neck pain.   Eyes: Negative for redness.  Respiratory: Positive for  shortness of breath. Negative for cough.   Cardiovascular: Positive for chest pain. Negative for palpitations and leg swelling.  Gastrointestinal: Negative for nausea, vomiting and abdominal pain.  Genitourinary: Negative for dysuria.  Musculoskeletal: Positive for arthralgias. Negative for back pain.  Skin: Negative for rash.  Neurological: Negative for syncope and light-headedness.    Allergies  Review of patient's allergies indicates no known allergies.  Home Medications   Current Outpatient Rx  Name  Route  Sig  Dispense  Refill  . acetaminophen (TYLENOL) 325 MG tablet   Oral   Take 2 tablets (650 mg total) by mouth every 4 (four) hours as needed (or Fever >/= 101).         Marland Kitchen aspirin 81 MG tablet   Oral   Take 81 mg by mouth daily.         . DULoxetine (CYMBALTA) 20 MG capsule   Oral   Take 20 mg by mouth 2 (two) times daily.          . ergocalciferol (VITAMIN D2) 50000 UNITS capsule   Oral   Take 50,000 Units by mouth once a week. SUNDAY         . gabapentin (NEURONTIN) 300 MG capsule   Oral   Take 300 mg by mouth 2 (two) times daily.          . insulin detemir (LEVEMIR) 100 UNIT/ML injection   Subcutaneous   Inject 30 Units into the skin 2 (two) times daily.         Marland Kitchen l-methylfolate-B6-B12 (METANX) 3-35-2 MG TABS   Oral   Take 1 tablet by mouth 2 (two) times daily.         . Multiple Vitamins-Minerals (MULTIVITAMINS THER. W/MINERALS) TABS   Oral   Take 1 tablet by mouth daily.          . nitroGLYCERIN (NITROSTAT) 0.4 MG SL tablet   Sublingual   Place 0.4 mg under the tongue every 5 (five) minutes x 3 doses as needed.         . Pancrelipase, Lip-Prot-Amyl, (ZENPEP) 25000 UNITS CPEP   Oral   Take 1-2 capsules by mouth 4 (four) times daily. 2 capsules with each meal and 1 capsule with a snack         . pioglitazone (ACTOS) 30 MG tablet   Oral   Take 30 mg by mouth daily.          . traMADol (ULTRAM) 50 MG tablet                  BP 125/83  Temp(Src) 97.4 F (36.3 C) (Oral)  Resp 17  SpO2 100%  Physical Exam  Nursing note and vitals reviewed.  Constitutional: He appears well-developed and well-nourished.  HENT:  Head: Normocephalic and atraumatic.  Mouth/Throat: Mucous membranes are normal. Mucous membranes are not dry.  Eyes: Conjunctivae are normal.  Neck: Trachea normal and normal range of motion. Neck supple. Normal carotid pulses and no JVD present. No muscular tenderness present. Carotid bruit is not present. No tracheal deviation present.  Cardiovascular: Regular rhythm, S1 normal, S2 normal, normal heart sounds and intact distal pulses.  Tachycardia present.  Exam reveals no distant heart sounds and no decreased pulses.   No murmur heard. Pulmonary/Chest: Effort normal and breath sounds normal. No respiratory distress. He has no wheezes. He exhibits no tenderness.  Abdominal: Soft. Normal aorta and bowel sounds are normal. There is no tenderness. There is no rebound and no guarding.  Musculoskeletal: He exhibits no edema.       Left shoulder: He exhibits decreased range of motion, tenderness (anterior shoulder) and bony tenderness. He exhibits no swelling, no spasm and normal pulse.       Arms: Neurological: He is alert.  Skin: Skin is warm and dry. He is not diaphoretic. No cyanosis. No pallor.  Psychiatric: He has a normal mood and affect.    ED Course  Procedures (including critical care time)  Labs Reviewed  CBC WITH DIFFERENTIAL - Abnormal; Notable for the following:    RBC 3.59 (*)    Hemoglobin 12.4 (*)    HCT 33.8 (*)    MCH 34.5 (*)    MCHC 36.7 (*)    All other components within normal limits  COMPREHENSIVE METABOLIC PANEL - Abnormal; Notable for the following:    Sodium 133 (*)    Chloride 88 (*)    Glucose, Bld 315 (*)    Creatinine, Ser 0.42 (*)    AST 172 (*)    ALT 113 (*)    Alkaline Phosphatase 141 (*)    All other components within normal limits  D-DIMER,  QUANTITATIVE - Abnormal; Notable for the following:    D-Dimer, Quant 3.40 (*)    All other components within normal limits  TROPONIN I  LIPASE, BLOOD  TROPONIN I   Dg Chest 2 View  12/11/2012  **ADDENDUM** CREATED: 12/11/2012 07:47:55  There is a nondisplaced fracture through the surgical neck of the left humerus.  This is better evaluated on the concurrently obtained left shoulder chest x-ray.  **END ADDENDUM** SIGNED BY: Sterling Big, M.D.   12/11/2012  *RADIOLOGY REPORT*  Clinical Data:  Chest pain  CHEST - 2 VIEW  Comparison: Prior chest x-ray 05/04/2012  Findings: Negative for pulmonary edema, or focal airspace consolidation.  No pneumothorax or pleural effusion.  Cardiac and mediastinal contours within normal limits.  There is atherosclerotic calcification of the transverse aorta.  Prominent left nipple shadows seen on prior radiographs.  No suspicious pulmonary nodule identified.  Multilevel degenerative disc disease, predominately in the lumbar spine.  No acute osseous abnormality.  IMPRESSION: No acute cardiopulmonary disease.   Original Report Authenticated By: Malachy Moan, M.D.    Ct Angio Chest Pe W/cm &/or Wo Cm  12/11/2012  *RADIOLOGY REPORT*  Clinical Data: Chest pain, fall  CT ANGIOGRAPHY CHEST  Technique:  Multidetector CT imaging of the chest using the standard protocol during bolus administration of intravenous contrast. Multiplanar reconstructed images including MIPs were obtained and reviewed to evaluate the vascular anatomy.  Contrast: 55mL OMNIPAQUE IOHEXOL 350 MG/ML SOLN  Comparison: Chest radiographs dated 12/11/2012  Findings: No evidence of the pulmonary embolism.  Mild paraseptal emphysematous  changes.  Lungs are otherwise clear. No suspicious pulmonary nodules.  No pleural effusion or pneumothorax.  Visualized thyroid is unremarkable.  The heart is normal in size.  No pericardial effusion.  Coronary atherosclerosis.  Atherosclerotic calcifications of the aortic  arch.  No suspicious mediastinal, hilar, or axillary lymphadenopathy.  Distal esophagus is mildly thick-walled (series 4/image 71), nonspecific, correlate for esophagitis.  Visualized upper abdomen is notable for sequela of prior/chronic pancreatitis including a 3.3 cm pseudocyst (series 4/image 104), severe hepatic steatosis, prior cholecystectomy, and vascular calcifications.  Suspected intramuscular hematoma in the left anterolateral chest wall (series 4/image 28).  Degenerative changes of the visualized thoracolumbar spine.  No fracture is seen.  IMPRESSION: No evidence of pulmonary embolism.  Suspected intramuscular hematoma in the left anterolateral chest wall.  Otherwise, no evidence of traumatic injury to the chest.  Mild paraseptal emphysematous changes.  Stable upper abdominal findings, as described above. findings, as described above.   Original Report Authenticated By: Charline Bills, M.D.    Dg Shoulder Left  12/11/2012  *RADIOLOGY REPORT*  Clinical Data: Left shoulder pain, fell this morning  LEFT SHOULDER - 2+ VIEW  Comparison: Concurrently obtained chest x-ray  Findings: Nondisplaced fracture through the surgical neck of the humerus.  The humeral head remains located on the scapular Y view. There is mild osteoarthritis of the acromioclavicular joint. Visualized thorax is within normal limits.  IMPRESSION:  Nondisplaced fracture through the surgical neck of the humerus.   Original Report Authenticated By: Malachy Moan, M.D.      1. Proximal humerus fracture, left, closed, initial encounter   2. Minor head injury without loss of consciousness, initial encounter   3. Chest pain     6:42 AM Patient seen and examined. Work-up initiated. Medications ordered. EKG reviewed.   Vital signs reviewed and are as follows: Filed Vitals:   12/11/12 0634  BP: 125/83  Temp: 97.4 F (36.3 C)  Resp: 17    Date: 12/11/2012  Rate: 135  Rhythm: sinus tachycardia  QRS Axis: normal  Intervals:  normal  ST/T Wave abnormalities: normal  Conduction Disutrbances:none  Narrative Interpretation:   Old EKG Reviewed: unchanged from 04/29/2012, tachy 111  Pt d/w and seen by Dr. Bebe Shaggy. D-dimer and additional pain control ordered.   D-dimer was elevated -- CT ordered. Will also get 2nd troponin.   CT reviewed by myself. No evidence for PE.   2nd troponin neg.   Patient's pain controlled. Immobilizer by ortho tech.   Prior to d/c, arm is neurovascularly intact with normal sensation and 2+ radial pulse in L wrist.   Patient counseled on use of narcotic pain medications. Counseled not to combine these medications with others containing tylenol. Urged not to drink alcohol, drive, or perform any other activities that requires focus while taking these medications. The patient verbalizes understanding and agrees with the plan.  Ortho f/u given and encouraged. Urged PCP f/u for eval of atypical CP.   Patient was counseled to return with severe chest pain, especially if the pain is crushing or pressure-like and spreads to the arms, back, neck, or jaw, or if they have sweating, nausea, or shortness of breath with the pain. They were encouraged to call 911 with these symptoms.   They were also told to return if their chest pain gets worse and does not go away with rest, they have an attack of chest pain lasting longer than usual despite rest and treatment with the medications their caregiver has prescribed, if they wake  from sleep with chest pain or shortness of breath, if they feel dizzy or faint, if they have chest pain not typical of their usual pain, or if they have any other emergent concerns regarding their health.  The patient verbalized understanding and agreed.     MDM  Fall, shoulder pain: humerus fx. UE is neurovascularly intact distally. No LOC. No indication of syncope.  Tachycardia: baseline appears to be 100-110 per previous notes, surgery visits. Suspect somewhat elevated  today 2/2 pain from shoulder fx. HR is just minimally higher than apparent baseline.   Patient with chest pain, atypical, lasts only several seconds at a time. PE r/o by CT.  Neg cardiac enzymes, unchanged EKG, no exertional component, lack of accompanying sx including palps, sweating, radiation.           Renne Crigler, PA-C 12/11/12 1450

## 2012-12-13 NOTE — ED Provider Notes (Signed)
Medical screening examination/treatment/procedure(s) were conducted as a shared visit with non-physician practitioner(s) and myself.  I personally evaluated the patient during the encounter   Joya Gaskins, MD 12/13/12 1248

## 2013-02-27 ENCOUNTER — Other Ambulatory Visit: Payer: Self-pay | Admitting: *Deleted

## 2013-02-27 MED ORDER — CLOPIDOGREL BISULFATE 75 MG PO TABS: 75.0000 mg | ORAL_TABLET | Freq: Every day | ORAL | Status: AC

## 2013-03-23 ENCOUNTER — Encounter: Payer: Self-pay | Admitting: Cardiology

## 2013-03-23 DIAGNOSIS — I729 Aneurysm of unspecified site: Secondary | ICD-10-CM

## 2013-03-23 DIAGNOSIS — T81718A Complication of other artery following a procedure, not elsewhere classified, initial encounter: Secondary | ICD-10-CM | POA: Insufficient documentation

## 2013-03-24 ENCOUNTER — Encounter: Payer: Self-pay | Admitting: Cardiovascular Disease

## 2013-03-24 ENCOUNTER — Ambulatory Visit (INDEPENDENT_AMBULATORY_CARE_PROVIDER_SITE_OTHER): Payer: Medicare Other | Admitting: Cardiovascular Disease

## 2013-03-24 VITALS — BP 112/62 | HR 122 | Ht 69.0 in | Wt 114.1 lb

## 2013-03-24 DIAGNOSIS — I10 Essential (primary) hypertension: Secondary | ICD-10-CM

## 2013-03-24 DIAGNOSIS — I739 Peripheral vascular disease, unspecified: Secondary | ICD-10-CM

## 2013-03-24 DIAGNOSIS — IMO0002 Reserved for concepts with insufficient information to code with codable children: Secondary | ICD-10-CM

## 2013-03-24 DIAGNOSIS — E1165 Type 2 diabetes mellitus with hyperglycemia: Secondary | ICD-10-CM

## 2013-03-24 NOTE — Progress Notes (Signed)
03/24/2013 Mitchell Mccall   [birth year]  [id number]  Primary Physician Mitchell Floro, MD Primary Cardiologist: Runell Gess MD [name]   HPI: The patient is a 62 year old thin and frail-appearing married Caucasian male, father of 3 and grandfather of 5 grandchildren, whom I last saw 8 months ago. He has a history of 45 pack years of tobacco use, smoking 1 pack per day, as well as diabetes. He also has peripheral vascular occlusive disease with a calcified occluded left common iliac which I revascularized on August 28, 2011, with subsequent Dopplers performed March 18, 2012, showing a patent stent with normal ABIs bilaterally. I did bring him back and perform iCAST stenting. He was hospitalized in September for a cholecystectomy. He has chronic pancreatitis and pancreatic pseudocyst and does continue to drink alcohol. He denies attempts at weight loss but is chronically weak and fatigued. He saw Dr. Tenny Craw last week who apparently drew blood work. He had a chest x-ray performed in September that was unremarkable.  I saw him 6 months ago and since that time he denied chest pain or shortness of breath. He does complain of some atypical shooting left lower extreme pain which does not sound like claudication. He continues to smoke one pack per day and drinks beer.   Current Outpatient Prescriptions  Medication Sig Dispense Refill  . clopidogrel (PLAVIX) 75 MG tablet Take 1 tablet (75 mg total) by mouth daily.  30 tablet  6  . DULoxetine (CYMBALTA) 30 MG capsule Take 30 mg by mouth 2 (two) times daily.      Marland Kitchen gabapentin (NEURONTIN) 300 MG capsule Take 300 mg by mouth 2 (two) times daily.       Marland Kitchen ibuprofen (ADVIL,MOTRIN) 200 MG tablet Take 400 mg by mouth every 6 (six) hours as needed for pain.      Marland Kitchen insulin detemir (LEVEMIR) 100 UNIT/ML injection Inject 30 Units into the skin 2 (two) times daily.      . Multiple Vitamins-Minerals (MULTIVITAMINS THER. W/MINERALS) TABS Take 1 tablet by  mouth daily.       . nitroGLYCERIN (NITROSTAT) 0.4 MG SL tablet Place 0.4 mg under the tongue every 5 (five) minutes x 3 doses as needed for chest pain.       Marland Kitchen NOVOLOG FLEXPEN 100 UNIT/ML SOPN FlexPen On a Sliding Scale      . Pancrelipase, Lip-Prot-Amyl, (ZENPEP) 25000 UNITS CPEP Take 1-2 capsules by mouth 4 (four) times daily. 2 capsules with each meal and 1 capsule with a snack      . traMADol (ULTRAM) 50 MG tablet Take 100 mg by mouth 2 (two) times daily as needed for pain.        No current facility-administered medications for this visit.    No Known Allergies  History   Social History  . Marital Status: Married    Spouse Name: N/A    Number of Children: N/A  . Years of Education: N/A   Occupational History  . Not on file.   Social History Main Topics  . Smoking status: Current Every Day Smoker -- 1.00 packs/day for 50 years    Types: Cigarettes  . Smokeless tobacco: Never Used  . Alcohol Use: 7.2 oz/week    12 Cans of beer per week     Comment: 6 pack of beer per week  . Drug Use: No  . Sexually Active: Not Currently    Birth Control/ Protection: Diaphragm   Other Topics Concern  . Not on  file   Social History Narrative  . No narrative on file     Review of Systems: General: negative for chills, fever, night sweats or weight changes.  Cardiovascular: negative for chest pain, dyspnea on exertion, edema, orthopnea, palpitations, paroxysmal nocturnal dyspnea or shortness of breath Dermatological: negative for rash Respiratory: negative for cough or wheezing Urologic: negative for hematuria Abdominal: negative for nausea, vomiting, diarrhea, bright red blood per rectum, melena, or hematemesis Neurologic: negative for visual changes, syncope, or dizziness All other systems reviewed and are otherwise negative except as noted above.    Blood pressure 112/62, pulse 122, height 5\' 9"  (1.753 m), weight 114 lb 1.6 oz (51.755 kg).  General appearance: alert and no  distress Neck: no adenopathy, no carotid bruit, no JVD, supple, symmetrical, trachea midline and thyroid not enlarged, symmetric, no tenderness/mass/nodules Lungs: clear to auscultation bilaterally Heart: regular rate and rhythm, S1, S2 normal, no murmur, click, rub or gallop Extremities: extremities normal, atraumatic, no cyanosis or edema  EKG sinus tachycardia at 122 with right bundle branch block  ASSESSMENT AND PLAN:   PVD RCIA PTA 11/12, LCIA PTA  Jan 2013 Status post PTA and stenting of both iliac arteries in the past. Arterial Doppler from 03/18/12 revealed ABIs of the proximal and one bilaterally with patent stents. Patient does complain of atypical left lower extremity pain which does not sound like claudication. His Doppler performed in July of this year were normal. His exam likewise is normal. We will repeat arterial Dopplers.      Runell Gess MD FACP,FACC,FAHA, [facility] 03/24/2013 3:10 PM

## 2013-03-24 NOTE — Patient Instructions (Addendum)
Your physician recommends that you schedule a follow-up appointment in: 1 year  Your physician has requested that you have a lower or upper extremity arterial duplex. This test is an ultrasound of the arteries in the legs or arms. It looks at arterial blood flow in the legs and arms. Allow one hour for Lower and Upper Arterial scans. There are no restrictions or special instructions

## 2013-03-24 NOTE — Assessment & Plan Note (Addendum)
Status post PTA and stenting of both iliac arteries in the past. Arterial Doppler from 03/18/12 revealed ABIs of the proximal and one bilaterally with patent stents. Patient does complain of atypical left lower extremity pain which does not sound like claudication. His Doppler performed in July of this year were normal. His exam likewise is normal. We will repeat arterial Dopplers.

## 2013-04-06 ENCOUNTER — Encounter (HOSPITAL_COMMUNITY): Payer: Medicare Other

## 2013-04-20 ENCOUNTER — Ambulatory Visit (HOSPITAL_COMMUNITY)
Admission: RE | Admit: 2013-04-20 | Discharge: 2013-04-20 | Disposition: A | Discharge status: Home / Self Care | Payer: Medicare Other | Source: Ambulatory Visit | Attending: Cardiovascular Disease | Admitting: Cardiovascular Disease

## 2013-04-20 DIAGNOSIS — I739 Peripheral vascular disease, unspecified: Secondary | ICD-10-CM | POA: Insufficient documentation

## 2013-04-20 NOTE — Progress Notes (Signed)
Lower Extremity Arterial Duplex Completed. °Brianna L Mazza,RVT °

## 2013-05-08 ENCOUNTER — Telehealth: Payer: Self-pay | Admitting: Cardiovascular Disease

## 2013-05-08 NOTE — Telephone Encounter (Signed)
Returning your call concerning test results

## 2013-05-08 NOTE — Telephone Encounter (Signed)
Spoke with patient's wife and results of doppler given.

## 2013-05-26 ENCOUNTER — Encounter: Payer: Self-pay | Admitting: Cardiovascular Disease

## 2013-05-26 ENCOUNTER — Ambulatory Visit (INDEPENDENT_AMBULATORY_CARE_PROVIDER_SITE_OTHER): Payer: Medicare Other | Admitting: Cardiovascular Disease

## 2013-05-26 VITALS — BP 122/80 | HR 112 | Ht 68.0 in | Wt 108.0 lb

## 2013-05-26 DIAGNOSIS — I10 Essential (primary) hypertension: Secondary | ICD-10-CM

## 2013-05-26 DIAGNOSIS — D689 Coagulation defect, unspecified: Secondary | ICD-10-CM

## 2013-05-26 DIAGNOSIS — I739 Peripheral vascular disease, unspecified: Secondary | ICD-10-CM

## 2013-05-26 DIAGNOSIS — Z79899 Other long term (current) drug therapy: Secondary | ICD-10-CM

## 2013-05-26 DIAGNOSIS — Z01818 Encounter for other preprocedural examination: Secondary | ICD-10-CM

## 2013-05-26 NOTE — Patient Instructions (Signed)
Dr. Allyson Sabal has ordered a peripheral angiogram to be done at [facility].  This procedure is going to look at the bloodflow in your lower extremities.  If Dr. Allyson Sabal is able to open up the arteries, you will have to spend one night in the hospital.  If he is not able to open the arteries, you will be able to go home that same day.    After the procedure, you will not be allowed to drive for 3 days or push, pull, or lift anything greater than 10 lbs for one week.    You will be required to have bloodwork  prior to your procedure.  Our scheduler will advise you on when these items need to be done.

## 2013-05-26 NOTE — Progress Notes (Signed)
05/26/2013 [name]   [birth year]  [id number]  Primary Physician [name], MD Primary Cardiologist: Runell Gess MD [name]   HPI:  The patient is a 62 year old thin and frail-appearing married Caucasian male, father of 3 and grandfather of 5 grandchildren, whom I last saw 8 months ago. He has a history of 45 pack years of tobacco use, smoking 1 pack per day, as well as diabetes. He also has peripheral vascular occlusive disease with a calcified occluded left common iliac which I revascularized on August 28, 2011, with subsequent Dopplers performed March 18, 2012, showing a patent stent with normal ABIs bilaterally. I did bring him back and perform iCAST stenting. He was hospitalized in September for a cholecystectomy. He has chronic pancreatitis and pancreatic pseudocyst and does continue to drink alcohol. He denies attempts at weight loss but is chronically weak and fatigued. He saw Dr. Tenny Craw last week who apparently drew blood work. He had a chest x-ray performed in September that was unremarkable.  I saw him 6 months ago and since that time he denied chest pain or shortness of breath. He does complain of some atypical shooting left lower extreme pain which does not sound like claudication. He continues to smoke one pack per day and drinks beer. Since I saw him in the office 03/24/13 he had lowered to the Dopplers performed on 04/20/13 which revealed a right ABI of 0.750 left ABI 1.1. He did have high-frequency velocities (500 cm/s) at the origin of his right common iliac artery suggesting restenosis and continuing to his recurrent symptoms. Based on this I decided to proceed with angiography and potential intervention.    Current Outpatient Prescriptions  Medication Sig Dispense Refill  . clopidogrel (PLAVIX) 75 MG tablet Take 1 tablet (75 mg total) by mouth daily.  30 tablet  6  . DULoxetine (CYMBALTA) 30 MG capsule Take 30 mg by mouth 2 (two) times daily.        Marland Kitchen gabapentin (NEURONTIN) 300 MG capsule Take 300 mg by mouth 2 (two) times daily.       Marland Kitchen ibuprofen (ADVIL,MOTRIN) 200 MG tablet Take 400 mg by mouth every 6 (six) hours as needed for pain.      Marland Kitchen insulin detemir (LEVEMIR) 100 UNIT/ML injection Inject 30 Units into the skin 2 (two) times daily.      . Multiple Vitamins-Minerals (MULTIVITAMINS THER. W/MINERALS) TABS Take 1 tablet by mouth daily.       . nitroGLYCERIN (NITROSTAT) 0.4 MG SL tablet Place 0.4 mg under the tongue every 5 (five) minutes x 3 doses as needed for chest pain.       Marland Kitchen NOVOLOG FLEXPEN 100 UNIT/ML SOPN FlexPen On a Sliding Scale      . Pancrelipase, Lip-Prot-Amyl, (ZENPEP) 25000 UNITS CPEP Take 1-2 capsules by mouth 4 (four) times daily. 2 capsules with each meal and 1 capsule with a snack      . traMADol (ULTRAM) 50 MG tablet Take 100 mg by mouth 2 (two) times daily as needed for pain.        No current facility-administered medications for this visit.    No Known Allergies  History   Social History  . Marital Status: Married    Spouse Name: N/A    Number of Children: N/A  . Years of Education: N/A   Occupational History  . Not on file.   Social History Main Topics  . Smoking status: Current Every Day Smoker -- 1.00  packs/day for 50 years    Types: Cigarettes  . Smokeless tobacco: Never Used  . Alcohol Use: 7.2 oz/week    12 Cans of beer per week     Comment: 6 pack of beer per week  . Drug Use: No  . Sexual Activity: Not Currently    Birth Control/ Protection: Diaphragm   Other Topics Concern  . Not on file   Social History Narrative  . No narrative on file     Review of Systems: General: negative for chills, fever, night sweats or weight changes.  Cardiovascular: negative for chest pain, dyspnea on exertion, edema, orthopnea, palpitations, paroxysmal nocturnal dyspnea or shortness of breath Dermatological: negative for rash Respiratory: negative for cough or wheezing Urologic: negative for  hematuria Abdominal: negative for nausea, vomiting, diarrhea, bright red blood per rectum, melena, or hematemesis Neurologic: negative for visual changes, syncope, or dizziness All other systems reviewed and are otherwise negative except as noted above.    Blood pressure 122/80, pulse 112, height 5\' 8"  (1.727 m), weight 108 lb (48.988 kg).  General appearance: alert and no distress Neck: no adenopathy, no carotid bruit, no JVD, supple, symmetrical, trachea midline and thyroid not enlarged, symmetric, no tenderness/mass/nodules Lungs: clear to auscultation bilaterally Heart: regular rate and rhythm, S1, S2 normal, no murmur, click, rub or gallop Extremities: extremities normal, atraumatic, no cyanosis or edema and 1 post right common femoral with soft bruit and 2+ left common femoral  EKG sinus tachycardia 113 without ST or T wave changes  ASSESSMENT AND PLAN:   PVD RCIA PTA 11/12, LCIA PTA  Jan 2013 Patient has undergone staged right and left common iliac artery intervention using diamondback orbital rotational arthrectomy. He's had recurrent symptoms in his right leg with recent Dopplers that showed decrease in his right ABI of 0.75 and an increase in his right common iliac artery velocities. Based on this I am going to arrange for him to undergo re\re angiography and potential intervention.      Runell Gess MD FACP,FACC,FAHA, [facility] 05/26/2013 12:07 PM

## 2013-05-26 NOTE — Assessment & Plan Note (Signed)
Patient has undergone staged right and left common iliac artery intervention using diamondback orbital rotational arthrectomy. He's had recurrent symptoms in his right leg with recent Dopplers that showed decrease in his right ABI of 0.75 and an increase in his right common iliac artery velocities. Based on this I am going to arrange for him to undergo re\re angiography and potential intervention.

## 2013-05-29 ENCOUNTER — Encounter: Payer: Self-pay | Admitting: Cardiovascular Disease

## 2013-05-29 LAB — PROTIME-INR: INR: 1.03 (ref <1.50)

## 2013-05-29 LAB — CBC
Hemoglobin: 14.2 g/dL (ref 13.0–17.0)
MCV: 99.8 fL (ref 78.0–100.0)
Platelets: 185 10*3/uL (ref 150–400)
RBC: 4.15 MIL/uL — ABNORMAL LOW (ref 4.22–5.81)
WBC: 8.6 10*3/uL (ref 4.0–10.5)

## 2013-05-30 ENCOUNTER — Telehealth: Payer: Self-pay | Admitting: *Deleted

## 2013-05-30 DIAGNOSIS — Z79899 Other long term (current) drug therapy: Secondary | ICD-10-CM

## 2013-05-30 LAB — BASIC METABOLIC PANEL
BUN: 3 mg/dL — ABNORMAL LOW (ref 6–23)
Potassium: 2.8 mEq/L — ABNORMAL LOW (ref 3.5–5.3)
Sodium: 130 mEq/L — ABNORMAL LOW (ref 135–145)

## 2013-05-30 MED ORDER — POTASSIUM CHLORIDE CRYS ER 20 MEQ PO TBCR: 40.0000 meq | EXTENDED_RELEASE_TABLET | Freq: Every day | ORAL | Status: DC

## 2013-05-30 NOTE — Telephone Encounter (Signed)
Called and spoke with patient wife, for patient to start potassium 40 mg daily and to repeat BMP bloodwork tomorrow as per Dr Allyson Sabal.Marland Kitchen

## 2013-05-31 LAB — BASIC METABOLIC PANEL
BUN: 4 mg/dL — ABNORMAL LOW (ref 6–23)
CO2: 34 mEq/L — ABNORMAL HIGH (ref 19–32)
Calcium: 8.9 mg/dL (ref 8.4–10.5)
Chloride: 91 mEq/L — ABNORMAL LOW (ref 96–112)
Creat: 0.5 mg/dL (ref 0.50–1.35)
Glucose, Bld: 79 mg/dL (ref 70–99)
Potassium: 3.9 mEq/L (ref 3.5–5.3)
Sodium: 133 mEq/L — ABNORMAL LOW (ref 135–145)

## 2013-06-01 ENCOUNTER — Encounter (HOSPITAL_COMMUNITY): Payer: Self-pay | Admitting: General Practice

## 2013-06-01 ENCOUNTER — Encounter (HOSPITAL_COMMUNITY): Payer: Self-pay | Admitting: Pharmacy Technician

## 2013-06-01 ENCOUNTER — Encounter (HOSPITAL_COMMUNITY)
Admission: RE | Disposition: A | Discharge status: Home / Self Care | Payer: Self-pay | Source: Ambulatory Visit | Attending: Cardiovascular Disease

## 2013-06-01 ENCOUNTER — Ambulatory Visit (HOSPITAL_COMMUNITY)
Admission: RE | Admit: 2013-06-01 | Discharge: 2013-06-02 | Disposition: A | Discharge status: Home / Self Care | Payer: Medicare Other | Source: Ambulatory Visit | Attending: Cardiovascular Disease | Admitting: Cardiovascular Disease

## 2013-06-01 DIAGNOSIS — I739 Peripheral vascular disease, unspecified: Secondary | ICD-10-CM | POA: Diagnosis present

## 2013-06-01 DIAGNOSIS — E876 Hypokalemia: Secondary | ICD-10-CM

## 2013-06-01 DIAGNOSIS — Z01818 Encounter for other preprocedural examination: Secondary | ICD-10-CM

## 2013-06-01 DIAGNOSIS — E1165 Type 2 diabetes mellitus with hyperglycemia: Secondary | ICD-10-CM | POA: Diagnosis present

## 2013-06-01 DIAGNOSIS — Z23 Encounter for immunization: Secondary | ICD-10-CM | POA: Insufficient documentation

## 2013-06-01 DIAGNOSIS — IMO0002 Reserved for concepts with insufficient information to code with codable children: Secondary | ICD-10-CM | POA: Insufficient documentation

## 2013-06-01 DIAGNOSIS — M79609 Pain in unspecified limb: Secondary | ICD-10-CM

## 2013-06-01 DIAGNOSIS — F17200 Nicotine dependence, unspecified, uncomplicated: Secondary | ICD-10-CM | POA: Insufficient documentation

## 2013-06-01 DIAGNOSIS — I70219 Atherosclerosis of native arteries of extremities with intermittent claudication, unspecified extremity: Secondary | ICD-10-CM | POA: Insufficient documentation

## 2013-06-01 DIAGNOSIS — K861 Other chronic pancreatitis: Secondary | ICD-10-CM | POA: Insufficient documentation

## 2013-06-01 DIAGNOSIS — K862 Cyst of pancreas: Secondary | ICD-10-CM | POA: Insufficient documentation

## 2013-06-01 HISTORY — DX: Headache: R51

## 2013-06-01 HISTORY — DX: Other generalized epilepsy and epileptic syndromes, not intractable, without status epilepticus: G40.409

## 2013-06-01 HISTORY — DX: Type 2 diabetes mellitus without complications: E11.9

## 2013-06-01 HISTORY — DX: Unspecified asthma, uncomplicated: J45.909

## 2013-06-01 LAB — GLUCOSE, CAPILLARY
Glucose-Capillary: 289 mg/dL — ABNORMAL HIGH (ref 70–99)
Glucose-Capillary: 231 mg/dL — ABNORMAL HIGH (ref 70–99)

## 2013-06-01 SURGERY — ABDOMINAL ANGIOGRAM

## 2013-06-01 MED ORDER — NITROGLYCERIN 0.4 MG SL SUBL: 0.4000 mg | SUBLINGUAL_TABLET | SUBLINGUAL | Status: DC | PRN

## 2013-06-01 MED ORDER — INSULIN ASPART 100 UNIT/ML ~~LOC~~ SOLN
0.0000 [IU] | Freq: Three times a day (TID) | SUBCUTANEOUS | Status: DC
  Administered 2013-06-01: 18:00:00 3 [IU] via SUBCUTANEOUS

## 2013-06-01 MED ORDER — MIDAZOLAM HCL 2 MG/2ML IJ SOLN
INTRAMUSCULAR | Status: AC
  Filled 2013-06-01: qty 2

## 2013-06-01 MED ORDER — CLOPIDOGREL BISULFATE 75 MG PO TABS
75.0000 mg | ORAL_TABLET | Freq: Every day | ORAL | Status: DC
Start: 2013-06-01 — End: 2013-06-02
  Administered 2013-06-01 – 2013-06-02 (×2): 75 mg via ORAL
  Filled 2013-06-01: qty 1

## 2013-06-01 MED ORDER — INSULIN ASPART 100 UNIT/ML FLEXPEN: 1.0000 [IU] | PEN_INJECTOR | SUBCUTANEOUS | Status: DC

## 2013-06-01 MED ORDER — IBUPROFEN 400 MG PO TABS
400.0000 mg | ORAL_TABLET | Freq: Four times a day (QID) | ORAL | Status: DC | PRN
  Filled 2013-06-01: qty 1

## 2013-06-01 MED ORDER — PANCRELIPASE (LIP-PROT-AMYL) 12000-38000 UNITS PO CPEP
2.0000 | ORAL_CAPSULE | ORAL | Status: DC | PRN
  Filled 2013-06-01: qty 2

## 2013-06-01 MED ORDER — NICOTINE 7 MG/24HR TD PT24
7.0000 mg | MEDICATED_PATCH | Freq: Every day | TRANSDERMAL | Status: DC
  Administered 2013-06-01 – 2013-06-02 (×2): 7 mg via TRANSDERMAL
  Filled 2013-06-01 (×2): qty 1

## 2013-06-01 MED ORDER — DULOXETINE HCL 30 MG PO CPEP
30.0000 mg | ORAL_CAPSULE | Freq: Two times a day (BID) | ORAL | Status: DC
Start: 2013-06-01 — End: 2013-06-02
  Administered 2013-06-01 – 2013-06-02 (×3): 30 mg via ORAL
  Filled 2013-06-01 (×4): qty 1

## 2013-06-01 MED ORDER — ZOLPIDEM TARTRATE 5 MG PO TABS
5.0000 mg | ORAL_TABLET | Freq: Every evening | ORAL | Status: DC | PRN
  Administered 2013-06-01: 5 mg via ORAL
  Filled 2013-06-01: qty 1

## 2013-06-01 MED ORDER — SODIUM CHLORIDE 0.9 % IV SOLN
INTRAVENOUS | Status: AC
  Administered 2013-06-01: 15:00:00 via INTRAVENOUS

## 2013-06-01 MED ORDER — INSULIN DETEMIR 100 UNIT/ML ~~LOC~~ SOLN
30.0000 [IU] | Freq: Every day | SUBCUTANEOUS | Status: DC
  Administered 2013-06-01 – 2013-06-02 (×2): 30 [IU] via SUBCUTANEOUS
  Filled 2013-06-01 (×3): qty 0.3

## 2013-06-01 MED ORDER — FENTANYL CITRATE 0.05 MG/ML IJ SOLN
INTRAMUSCULAR | Status: AC
  Filled 2013-06-01: qty 2

## 2013-06-01 MED ORDER — SODIUM CHLORIDE 0.9 % IJ SOLN: 3.0000 mL | INTRAMUSCULAR | Status: DC | PRN

## 2013-06-01 MED ORDER — DIAZEPAM 5 MG PO TABS
5.0000 mg | ORAL_TABLET | Freq: Once | ORAL | Status: AC
  Administered 2013-06-01: 5 mg via ORAL
  Filled 2013-06-01: qty 1

## 2013-06-01 MED ORDER — TRAMADOL HCL 50 MG PO TABS
100.0000 mg | ORAL_TABLET | Freq: Two times a day (BID) | ORAL | Status: DC | PRN
  Administered 2013-06-01: 100 mg via ORAL
  Filled 2013-06-01: qty 2

## 2013-06-01 MED ORDER — SODIUM CHLORIDE 0.9 % IV SOLN
INTRAVENOUS | Status: DC
  Administered 2013-06-01: 11:00:00 via INTRAVENOUS

## 2013-06-01 MED ORDER — PANCRELIPASE (LIP-PROT-AMYL) 12000-38000 UNITS PO CPEP
2.0000 | ORAL_CAPSULE | Freq: Four times a day (QID) | ORAL | Status: DC
  Administered 2013-06-01 – 2013-06-02 (×3): 2 via ORAL
  Filled 2013-06-01 (×8): qty 4

## 2013-06-01 MED ORDER — HYDRALAZINE HCL 20 MG/ML IJ SOLN
10.0000 mg | INTRAMUSCULAR | Status: DC
  Administered 2013-06-01 – 2013-06-02 (×2): 10 mg via INTRAVENOUS
  Filled 2013-06-01 (×2): qty 1

## 2013-06-01 MED ORDER — ACETAMINOPHEN 325 MG PO TABS: 650.0000 mg | ORAL_TABLET | ORAL | Status: DC | PRN

## 2013-06-01 MED ORDER — LIDOCAINE HCL (PF) 1 % IJ SOLN
INTRAMUSCULAR | Status: AC
  Filled 2013-06-01: qty 30

## 2013-06-01 MED ORDER — GABAPENTIN 300 MG PO CAPS
300.0000 mg | ORAL_CAPSULE | Freq: Two times a day (BID) | ORAL | Status: DC
Start: 2013-06-01 — End: 2013-06-02
  Administered 2013-06-01 – 2013-06-02 (×3): 300 mg via ORAL
  Filled 2013-06-01 (×4): qty 1

## 2013-06-01 MED ORDER — POTASSIUM CHLORIDE CRYS ER 20 MEQ PO TBCR
40.0000 meq | EXTENDED_RELEASE_TABLET | Freq: Every day | ORAL | Status: DC
  Administered 2013-06-01 – 2013-06-02 (×2): 40 meq via ORAL
  Filled 2013-06-01 (×3): qty 2

## 2013-06-01 MED ORDER — MORPHINE SULFATE 2 MG/ML IJ SOLN: 1.0000 mg | INTRAMUSCULAR | Status: DC | PRN

## 2013-06-01 MED ORDER — INFLUENZA VAC SPLIT QUAD 0.5 ML IM SUSP
0.5000 mL | INTRAMUSCULAR | Status: AC
  Administered 2013-06-02: 12:00:00 0.5 mL via INTRAMUSCULAR
  Filled 2013-06-01: qty 0.5

## 2013-06-01 MED ORDER — ONDANSETRON HCL 4 MG/2ML IJ SOLN: 4.0000 mg | Freq: Four times a day (QID) | INTRAMUSCULAR | Status: DC | PRN

## 2013-06-01 MED ORDER — HEPARIN (PORCINE) IN NACL 2-0.9 UNIT/ML-% IJ SOLN
INTRAMUSCULAR | Status: AC
  Filled 2013-06-01: qty 1000

## 2013-06-01 MED ORDER — DIAZEPAM 5 MG PO TABS
5.0000 mg | ORAL_TABLET | ORAL | Status: AC
  Administered 2013-06-01: 5 mg via ORAL

## 2013-06-01 MED ORDER — ASPIRIN 81 MG PO CHEW
81.0000 mg | CHEWABLE_TABLET | ORAL | Status: AC
  Administered 2013-06-01: 81 mg via ORAL

## 2013-06-01 NOTE — H&P (Signed)
.    Pt was reexamined and existing H & P reviewed. No changes found.  Runell Gess, MD [facility] 06/01/2013 12:59 PM

## 2013-06-01 NOTE — CV Procedure (Signed)
Mitchell Mccall is a 62 y.o. male    [id number] LOCATION:  FACILITY: [facility]  PHYSICIAN: Nanetta Batty, M.D. 11/16/1950   DATE OF PROCEDURE:  06/01/2013  DATE OF DISCHARGE:     PV ANGIOGRAM     History obtained from chart review.The patient is a 62 year old thin and frail-appearing married Caucasian male, father of 3 and grandfather of 5 grandchildren, whom I last saw 8 months ago. He has a history of 45 pack years of tobacco use, smoking 1 pack per day, as well as diabetes. He also has peripheral vascular occlusive disease with a calcified occluded left common iliac which I revascularized on August 28, 2011, with subsequent Dopplers performed March 18, 2012, showing a patent stent with normal ABIs bilaterally. I did bring him back and perform iCAST stenting. He was hospitalized in September for a cholecystectomy. He has chronic pancreatitis and pancreatic pseudocyst and does continue to drink alcohol. He denies attempts at weight loss but is chronically weak and fatigued. He saw Dr. Tenny Craw last week who apparently drew blood work. He had a chest x-ray performed in September that was unremarkable.  I saw him 6 months ago and since that time he denied chest pain or shortness of breath. He does complain of some atypical shooting left lower extreme pain which does not sound like claudication. He continues to smoke one pack per day and drinks beer.  Since I saw him in the office 03/24/13 he had lowered to the Dopplers performed on 04/20/13 which revealed a right ABI of 0.750 left ABI 1.1. He did have high-frequency velocities (500 cm/s) at the origin of his right common iliac artery suggesting restenosis and continuing to his recurrent symptoms. Based on this I decided to proceed with angiography and potential intervention    PROCEDURE DESCRIPTION:    The patient was brought to the second floor [place] [facility] cath lab in the postabsorptive state. He was premedicated with Valium 5 mg by mouth, IV Versed  and fentanyl. His right and left groinsWere prepped and shaved in usual sterile fashion. Xylocaine 1% was used for local anesthesia. A 5 French sheath was inserted into the right and left common femoral arteries  using standard Seldinger technique. A 5 French pigtail catheter was used for abdominal aortography in the AP and oblique views. Visipaque dye which is for the entirety of the case (74 cc administered to the patient). Retrograde aorta aortic pressure was monitored during the case. There was a 60 mm gradient across the right common iliac artery ostium.   HEMODYNAMICS:    AO SYSTOLIC/AO DIASTOLIC: 159/68   Right common femoral artery pressure-110/67  ANGIOGRAPHIC RESULTS:   1: Abdominal aortogram-the distal abdominal aorta was mildly dilated. The stent in the origin of the left common iliac artery was widely patent. The protruded 1-2 mm beyond the ostium. The ostium of the right common iliac artery had a complex calcified high-grade stenosis with a 60 mm gradient.  IMPRESSION:Mitchell Mccall has a widely patent left common iliac artery stent with significant progression of disease at the origin of his right common iliac artery. This is calcified, and exophytic. I was able to cross with an L3-5 Glidewire but was unable to cross with a 4 [place] endhole catheter. He will require orbital for additional atherectomy to debulk prior to stenting plans somewhat concerned about the issue of stent protrusion on the left. At this point we will terminate the procedure and review the angiogram. Because of puncture both groins am going  to keep the patient overnight and hydrate him, and send him home in the morning.  Mitchell Gess MD, [facility] 06/01/2013 2:02 PM

## 2013-06-02 DIAGNOSIS — I739 Peripheral vascular disease, unspecified: Secondary | ICD-10-CM

## 2013-06-02 DIAGNOSIS — E876 Hypokalemia: Secondary | ICD-10-CM

## 2013-06-02 LAB — GLUCOSE, CAPILLARY: Glucose-Capillary: 267 mg/dL — ABNORMAL HIGH (ref 70–99)

## 2013-06-02 LAB — BASIC METABOLIC PANEL
BUN: 6 mg/dL (ref 6–23)
CO2: 29 mEq/L (ref 19–32)
Calcium: 8.5 mg/dL (ref 8.4–10.5)
Chloride: 101 mEq/L (ref 96–112)
Creatinine, Ser: 0.29 mg/dL — ABNORMAL LOW (ref 0.50–1.35)
GFR calc Af Amer: >90 mL/min (ref >90)
GFR calc non Af Amer: >90 mL/min (ref >90)
Glucose, Bld: 37 mg/dL — CL (ref 70–99)
Potassium: 2.5 mEq/L — CL (ref 3.5–5.1)
Sodium: 141 mEq/L (ref 135–145)

## 2013-06-02 LAB — CBC
HCT: 37.9 % — ABNORMAL LOW (ref 39.0–52.0)
Hemoglobin: 13.5 g/dL (ref 13.0–17.0)
MCH: 34.2 pg — ABNORMAL HIGH (ref 26.0–34.0)
MCHC: 35.6 g/dL (ref 30.0–36.0)
MCV: 95.9 fL (ref 78.0–100.0)
Platelets: 207 10*3/uL (ref 150–400)
RBC: 3.95 MIL/uL — ABNORMAL LOW (ref 4.22–5.81)
RDW: 13.7 % (ref 11.5–15.5)
WBC: 11.8 10*3/uL — ABNORMAL HIGH (ref 4.0–10.5)

## 2013-06-02 LAB — POTASSIUM: Potassium: 3.7 mEq/L (ref 3.5–5.1)

## 2013-06-02 LAB — MAGNESIUM: Magnesium: 2 mg/dL (ref 1.5–2.5)

## 2013-06-02 MED ORDER — MAGNESIUM SULFATE 40 MG/ML IJ SOLN
2.0000 g | Freq: Once | INTRAMUSCULAR | Status: AC
  Administered 2013-06-02: 2 g via INTRAVENOUS
  Filled 2013-06-02: qty 50

## 2013-06-02 MED ORDER — ENSURE COMPLETE PO LIQD
237.0000 mL | Freq: Two times a day (BID) | ORAL | Status: DC
  Administered 2013-06-02: 237 mL via ORAL
  Filled 2013-06-02 (×4): qty 237

## 2013-06-02 MED ORDER — POTASSIUM CHLORIDE CRYS ER 20 MEQ PO TBCR
20.0000 meq | EXTENDED_RELEASE_TABLET | Freq: Once | ORAL | Status: AC
  Administered 2013-06-02: 20 meq via ORAL

## 2013-06-02 MED ORDER — DEXTROSE 50 % IV SOLN
INTRAVENOUS | Status: AC
  Administered 2013-06-02: 25 mL
  Filled 2013-06-02: qty 50

## 2013-06-02 NOTE — Progress Notes (Signed)
INITIAL NUTRITION ASSESSMENT  DOCUMENTATION CODES Per approved criteria  -Underweight   INTERVENTION: 1. Ensure Complete po BID, each supplement provides 350 kcal and 13 grams of protein.  NUTRITION DIAGNOSIS: Unintentional weight loss related to unknown etiology as evidenced by weight hx .   Goal: PO intake to meet >/=90% estimated nutrition needs.   Monitor:  PO intake, weight trends, labs   Reason for Assessment: Malnutrition Screening Tool  62 y.o. male  Admitting Dx: PVD (peripheral vascular disease)  ASSESSMENT: Pt admitted for PV angiogram on 10/9. Planned for d/c today.  Spoke with pt who states he has lost about 40 lb in the last 4-5 years. Appetite remains good.  Spoke with pt about oral nutrition supplements, drinks about 4-5 per week. Encouraged at least one per day. Also encouraged pt to monitor weight at least weekly.   Height: Ht Readings from Last 1 Encounters:  06/01/13 5\' 8"  (1.727 m)    Weight: Wt Readings from Last 1 Encounters:  06/02/13 101 lb 6.6 oz (46 kg)    Ideal Body Weight: 154 lbs   % Ideal Body Weight: 66%  Wt Readings from Last 10 Encounters:  06/02/13 101 lb 6.6 oz (46 kg)  06/02/13 101 lb 6.6 oz (46 kg)  05/26/13 108 lb (48.988 kg)  03/24/13 114 lb 1.6 oz (51.755 kg)  09/15/12 115 lb (52.164 kg)  05/27/12 117 lb 12.8 oz (53.434 kg)  05/06/12 121 lb (54.885 kg)  05/06/12 121 lb (54.885 kg)  04/29/12 121 lb 11.2 oz (55.203 kg)  03/23/12 124 lb (56.246 kg)    Usual Body Weight: 124 lbs in the past year  % Usual Body Weight: 81%  BMI:  Body mass index is 15.42 kg/(m^2). underweight   Estimated Nutritional Needs: Kcal: 1400-1600 Protein: 50-60 gm  Fluid: 1.4-1.6 L   Skin: intact   Diet Order: Carb Control  EDUCATION NEEDS: -No education needs identified at this time   Intake/Output Summary (Last 24 hours) at 06/02/13 1111 Last data filed at 06/02/13 0900  Gross per 24 hour  Intake    735 ml  Output    450 ml   Net    285 ml    Last BM: 10/10    Labs:   Recent Labs Lab 05/29/13 1349 05/30/13 1039 06/02/13 0550  NA 130* 133* 141  K 2.8* 3.9 2.5*  CL 89* 91* 101  CO2 29 34* 29  BUN 3* 4* 6  CREATININE 0.52 0.50 0.29*  CALCIUM 9.0 8.9 8.5  MG  --   --  1.6  GLUCOSE 362* 79 37*    CBG (last 3)   Recent Labs  06/02/13 0552 06/02/13 0616 06/02/13 0808  GLUCAP 36* 130* 172*    Scheduled Meds: . clopidogrel  75 mg Oral Daily  . DULoxetine  30 mg Oral BID  . gabapentin  300 mg Oral BID  . hydrALAZINE  10 mg Intravenous UD  . influenza vac split quadrivalent PF  0.5 mL Intramuscular Tomorrow-1000  . insulin aspart  0-9 Units Subcutaneous TID WC  . insulin detemir  30 Units Subcutaneous Daily  . lipase/protease/amylase  2-4 capsule Oral QID  . nicotine  7 mg Transdermal Daily  . potassium chloride SA  40 mEq Oral Daily    Continuous Infusions:   Past Medical History  Diagnosis Date  . Peripheral vascular disease Jan 2013, Nov 2012    LCIA stent , RCIA stent  . GERD (gastroesophageal reflux disease)   . Stomach  ulcer   . Hypertension   . Anxiety   . Depression   . H/O hiatal hernia   . Anemia   . Undescended testicle   . Colon polyps   . Chronic lower back pain   . Subdural hematoma JULY 2006  . Duodenitis   . Esophagitis   . Coarse tremors   . Diverticulosis   . Rib fracture DEC 2005    Right sided from a fall  . Rectal bleed JULY 2004  . Vitamin D deficiency   . Macrocytosis   . DTs (delirium tremens)   . Claudication in peripheral vascular disease   . Pancreatitis, alcoholic   . Fatty liver, alcoholic   . Hemorrhoids   . 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 Pancreatic pseudocyst   . Asthma   . Pneumonia DEC 2003  . Type II diabetes mellitus   . Blood dyscrasia     macrocytosis  . ZOXWRUEA(540.9)     "probably monthly" (06/01/2013)  . Seizures     "loses contact w/the world for a minute then comes  back; probably q 6 months he'll have a couple of them" (06/01/2013)  . 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 Angioplasty / stenting iliac  Jan 2013, Nov 2012    LCIA stent, RCIA stent  . Ligament repair Left 1955    eg   . False aneurysm repair  09/16/2011    Procedure: REPAIR FALSE ANEURYSM;  Surgeon: Larina Earthly, MD;  Location: [facility];  Service: Vascular;  Laterality: Left;  . Incision and drainage of wound      groin  . Eus  01/20/2012    Procedure: ESOPHAGEAL ENDOSCOPIC ULTRASOUND (EUS) RADIAL;  Surgeon: Willis Modena, MD;  Location: [facility] ENDOSCOPY;  Service: Endoscopy;  Laterality: N/A;  . Fine needle aspiration  01/20/2012    Procedure: FINE NEEDLE ASPIRATION (FNA) LINEAR;  Surgeon: Willis Modena, MD;  Location: [facility] ENDOSCOPY;  Service: Endoscopy;  Laterality: N/A;  . Cholecystectomy  05/06/2012    Procedure: LAPAROSCOPIC CHOLECYSTECTOMY WITH INTRAOPERATIVE CHOLANGIOGRAM;  Surgeon: Atilano Ina, MD,FACS;  Location: [facility] OR;  Service: General;  Laterality: N/A;  laparoscopic cholecystectomy with intraoperative cholangiogram  . Ligament repair Right 1990's    "had an extra ligament in & they cut it loose" (06/01/2013)    Isabell Jarvis RD, LDN Pager [phone] After Hours pager [phone]

## 2013-06-02 NOTE — Progress Notes (Addendum)
Subjective: Drowsy this morning. He did not sleep well last night. He is feeling much better this morning. He is less weak and "shaky". He denies right groin, flank or back pain.   Objective: Vital signs in last 24 hours: Temp:  [98.1 F (36.7 C)-98.7 F (37.1 C)] 98.4 F (36.9 C) (10/10 0049) Pulse Rate:  [93-117] 93 (10/10 0500) Resp:  [15-18] 17 (10/10 0500) BP: (134-179)/(52-88) 134/56 mmHg (10/10 0500) SpO2:  [92 %-100 %] 93 % (10/10 0500) Weight:  [100 lb (45.36 kg)-101 lb 6.6 oz (46 kg)] 101 lb 6.6 oz (46 kg) (10/10 0049) Last BM Date: 06/01/13  Intake/Output from previous day: 10/09 0701 - 10/10 0700 In: 495 [P.O.:120; I.V.:375] Out: 450 [Urine:450] Intake/Output this shift:    Medications Current Facility-Administered Medications  Medication Dose Route Frequency Provider Last Rate Last Dose  . acetaminophen (TYLENOL) tablet 650 mg  650 mg Oral Q4H PRN Runell Gess, MD      . clopidogrel (PLAVIX) tablet 75 mg  75 mg Oral Daily Runell Gess, MD   75 mg at 06/01/13 1636  . DULoxetine (CYMBALTA) DR capsule 30 mg  30 mg Oral BID Runell Gess, MD   30 mg at 06/01/13 2128  . gabapentin (NEURONTIN) capsule 300 mg  300 mg Oral BID Runell Gess, MD   300 mg at 06/01/13 2128  . hydrALAZINE (APRESOLINE) injection 10 mg  10 mg Intravenous UD Runell Gess, MD   10 mg at 06/02/13 0129  . ibuprofen (ADVIL,MOTRIN) tablet 400 mg  400 mg Oral Q6H PRN Runell Gess, MD      . influenza vac split quadrivalent PF (FLUARIX) injection 0.5 mL  0.5 mL Intramuscular Tomorrow-1000 Runell Gess, MD      . insulin aspart (novoLOG) injection 0-9 Units  0-9 Units Subcutaneous TID WC Brittainy Simmons, PA-C   3 Units at 06/01/13 1742  . insulin detemir (LEVEMIR) injection 30 Units  30 Units Subcutaneous Daily Brittainy Simmons, PA-C   30 Units at 06/01/13 1500  . lipase/protease/amylase (CREON-12/PANCREASE) capsule 2 capsule  2 capsule Oral PRN Runell Gess, MD        . lipase/protease/amylase (CREON-12/PANCREASE) capsule 2-4 capsule  2-4 capsule Oral QID Runell Gess, MD   2 capsule at 06/01/13 2128  . magnesium sulfate IVPB 2 g 50 mL  2 g Intravenous Once [employer], PA-C      . morphine 2 MG/ML injection 1 mg  1 mg Intravenous Q1H PRN Runell Gess, MD      . nicotine (NICODERM CQ - dosed in mg/24 hr) patch 7 mg  7 mg Transdermal Daily Brittainy Simmons, PA-C   7 mg at 06/01/13 1840  . nitroGLYCERIN (NITROSTAT) SL tablet 0.4 mg  0.4 mg Sublingual Q5 Min x 3 PRN Runell Gess, MD      . ondansetron [facility]) injection 4 mg  4 mg Intravenous Q6H PRN Runell Gess, MD      . potassium chloride SA (K-DUR,KLOR-CON) CR tablet 40 mEq  40 mEq Oral Daily Runell Gess, MD   40 mEq at 06/02/13 0709  . traMADol (ULTRAM) tablet 100 mg  100 mg Oral BID PRN Runell Gess, MD   100 mg at 06/01/13 1929  . zolpidem (AMBIEN) tablet 5 mg  5 mg Oral QHS PRN Quintella Reichert, MD   5 mg at 06/01/13 2224    PE: General appearance: alert, cooperative and no distress  Lungs: clear to auscultation bilaterally Heart: regular rate and rhythm, S1, S2 normal, no murmur, click, rub or gallop Extremities: no LEE, the right groin: no ecchymosis, mildly tender, no hematoma, bruit or thrill Pulses: 2+ radials, 2+ left DP, 0 right DP Skin: warm and dry Neurologic: Grossly normal  Lab Results:   Recent Labs  06/02/13 0550  WBC 11.8*  HGB 13.5  HCT 37.9*  PLT 207   BMET  Recent Labs  05/30/13 1039 06/02/13 0550  NA 133* 141  K 3.9 2.5*  CL 91* 101  CO2 34* 29  GLUCOSE 79 37*  BUN 4* 6  CREATININE 0.50 0.29*  CALCIUM 8.9 8.5    Studies/Results:  PV Angio 06/01/13   HEMODYNAMICS:  AO SYSTOLIC/AO DIASTOLIC: 159/68  Right common femoral artery pressure-110/67  ANGIOGRAPHIC RESULTS:  1: Abdominal aortogram-the distal abdominal aorta was mildly dilated. The stent in the origin of the left common iliac artery was widely patent. The protruded  1-2 mm beyond the ostium. The ostium of the right common iliac artery had a complex calcified high-grade stenosis with a 60 mm gradient.    Assessment/Plan  Principal Problem:   PVD RCIA PTA 11/12, LCIA PTA  Jan 2013 - patent left CIA stent, right CIA stenosis via PV angio 06/01/13 Active Problems:   Claudication   DM (diabetes mellitus), type 2, uncontrolled with complications   Hypokalemia  Plan: S/p PV angio yesterday revealing a widely patent left common iliac artery stent with significant progression of disease at the origin of his right common iliac artery. This is calcified and a catheter could not cross the segment. The intervention was terminated. He will require orbital rotational atherectomy to debulk prior to stenting. Plan is to have patient return at a later date for the intervention.  He was kept overnight for hydration and observation. The right groin has remained stable, free from hematoma and bruit. Renal function is WNL. His BG dropped to the 30s overnight. He was given dextrose and BG improved to 130. He was hypokalemic with K+ of 2.5. Supplemental K+ was given. Mg was just checked and was 1.6. I have written for IV Mg. Will reassess both K and Mg in a few hours to see if deficits have been replete. Will continue to monitor BG as well. Will have patient ambulate with assistance later today. BP and HR both stable. WBC slightly elevated at 11.8. He is afebrile. Will continue to monitor. Will reassess this afternoon, if stable, plan for discharge.     LOS: 1 day    Brittainy M. Sharol Harness, PA-C 06/02/2013 8:07 AM  I have seen and examined the patient along with Brittainy M. Sharol Harness, PA-C.  I have reviewed the chart, notes and new data.  I agree with PA's note.  Key new complaints: feels much better. Ambulating in hall without difficulty Key new findings / data: K. MG recheck pending PLAN: DC if labs are improved Thurmon Fair, MD, [facility] [phone] 06/02/2013, 10:28 AM

## 2013-06-02 NOTE — Discharge Summary (Signed)
Physician Discharge Summary  Patient ID: Mitchell Mccall MRN: [id number] DOB/AGE: Apr 20, 1951 62 y.o.  Admit date: 06/01/2013 Discharge date: 06/02/2013  Admission Diagnoses: PVD with Claudication  Discharge Diagnoses:  Principal Problem:   PVD RCIA PTA 11/12, LCIA PTA  Jan 2013 - patent left CIA stent, right CIA stenosis via PV angio 06/01/13 Active Problems:   Claudication   DM (diabetes mellitus), type 2, uncontrolled with complications   Hypokalemia   Discharged Condition: stable  Hospital Course: The patient is a 62 y/o thin appearing Caucasian male, followed by Dr. Allyson Sabal.  He has a history of 45 pack years of tobacco use, smoking 1 pack per day, as well as diabetes. He also has peripheral vascular occlusive disease with a calcified occluded left common iliac, which Dr. Allyson Sabal revascularized on August 28, 2011, with subsequent Dopplers performed March 18, 2012, showing a patent stent with normal ABIs bilaterally. Dr. Allyson Sabal later, brought him back and performed iCAST stenting. Dr. Allyson Sabal saw him recently in clinic for routein follow-up and the patient complained of right lower extremity claudication. Dopplers in the office on 04/20/13 revealed a right ABI of 0.75 and a left ABI of 1.1. He did have high-frequency velocities (500 cm/s) at the origin of his right common iliac artery suggesting restenosis which was consistent to his recurrent symptoms. Based on this, Dr. Allyson Sabal decided to proceed with angiography and potential intervention. The patient presented to [facility] on 06/01/13 for the procedure. The stent in the origin of the left common iliac artery was widely patent. However, the ostium of the right common iliac artery had a complex calcified high-grade stenosis with a 60 mm gradient. Unfortunately, a catheter could not pass through the segment. The procedure was terminated, as it was felt that he would require orbital roational atherectomy to debulk prior stenting. The patient left the cath lab  in stable condition. He was kept overnight for observation and hydration. Post-cath he did become hypoglycemic with a BG of 30. He was treated with IV dextrose and his BG improved to 130. He was also hypokalemic with a K+ of 2.5. Mg was subsequently checked and was found to be 1.6. He received both supplemental K+ and Mg. Both levels were rechecked several hours later and had improved to a K+ of 3.7 and Mg of 2.0. The patient's right femoral access site remained stable, free from hematoma and bruit. He had no pain with ambulation. He was last seen and examined by Dr. Royann Shivers, who felt that he was stable for discharge. He will follow up with Dr. Allyson Sabal on 06/12/13 to discuss a plan for staged intervention on the right CIA.   Consults: None  Significant Diagnostic Studies:   PV Angio 06/01/13 HEMODYNAMICS:  AO SYSTOLIC/AO DIASTOLIC: 159/68  Right common femoral artery pressure-110/67  ANGIOGRAPHIC RESULTS:  1: Abdominal aortogram-the distal abdominal aorta was mildly dilated. The stent in the origin of the left common iliac artery was widely patent. The protruded 1-2 mm beyond the ostium. The ostium of the right common iliac artery had a complex calcified high-grade stenosis with a 60 mm gradient.   Treatments: See Hospital Course  Discharge Exam: Blood pressure 130/59, pulse 96, temperature 98.1 F (36.7 C), temperature source Oral, resp. rate 18, height 5\' 8"  (1.727 m), weight 101 lb 6.6 oz (46 kg), SpO2 98.00%.  Disposition: 06-Home-Health Care Svc      Discharge Orders   Future Appointments Provider Department Dept Phone   06/12/2013 4:00 PM Runell Gess,  MD [facility] [phone]   Future Orders Complete By Expires   Diet - low sodium heart healthy  As directed    Driving Restrictions  As directed    Comments:     No driving for 3 days   Increase activity slowly  As directed    Lifting restrictions  As directed    Comments:     No lifting more than 1/2 gallon  of milk for 3 days       Medication List         clopidogrel 75 MG tablet  Commonly known as:  PLAVIX  Take 1 tablet (75 mg total) by mouth daily.     DULoxetine 30 MG capsule  Commonly known as:  CYMBALTA  Take 30 mg by mouth 2 (two) times daily.     gabapentin 300 MG capsule  Commonly known as:  NEURONTIN  Take 300 mg by mouth 2 (two) times daily.     ibuprofen 200 MG tablet  Commonly known as:  ADVIL,MOTRIN  Take 400 mg by mouth every 6 (six) hours as needed for pain.     insulin detemir 100 UNIT/ML injection  Commonly known as:  LEVEMIR  Inject 30 Units into the skin daily.     multivitamins ther. w/minerals Tabs tablet  Take 1 tablet by mouth daily.     nitroGLYCERIN 0.4 MG SL tablet  Commonly known as:  NITROSTAT  Place 0.4 mg under the tongue every 5 (five) minutes x 3 doses as needed for chest pain.     NOVOLOG FLEXPEN 100 UNIT/ML Sopn FlexPen  Generic drug:  insulin aspart  1-8 Units See admin instructions. On a Sliding Scale     potassium chloride SA 20 MEQ tablet  Commonly known as:  KLOR-CON M20  Take 2 tablets (40 mEq total) by mouth daily.     traMADol 50 MG tablet  Commonly known as:  ULTRAM  Take 100 mg by mouth 2 (two) times daily as needed for pain.     ZENPEP 25000 UNITS Cpep  Generic drug:  Pancrelipase (Lip-Prot-Amyl)  Take 1-2 capsules by mouth 4 (four) times daily. 2 capsules with each meal and 1 capsule with a snack       Follow-up Information   Follow up with Runell Gess, MD On 06/12/2013. (4:00 pm )    Specialty:  Cardiology   Contact information:   [address] [place] [address] [phone]      TIME SPENT ON DISCHARGE SUMMARY INCLUDING PHYSICIAN TIME: > 30 MINUTES  Signed: BRITTAINY M.SIMMONS, PA-C 06/02/2013, 12:49 PM

## 2013-06-02 NOTE — Progress Notes (Signed)
Labs returned normal. Mg was 2.0 and K+ 3.7. Will discharge home today.  Allayne Butcher, PA-C

## 2013-06-02 NOTE — Progress Notes (Signed)
CRITICAL VALUE ALERT  Critical value received:  Potassium 2.5mg  and Glucose 37 per lab draw, 35 per CBG machine  Date of notification:  06/02/2013  Time of notification:  06:50  Critical value read back:yes  Nurse who received alert:  Toney Sang RN  MD notified (1st page):  PA Felecia Shelling  Time of first page:  06:55  MD notified (2nd page):  Time of second page:  Responding MD:   Time MD responded:  07:00

## 2013-06-08 ENCOUNTER — Ambulatory Visit: Payer: Medicare Other | Admitting: Cardiovascular Disease

## 2013-06-12 ENCOUNTER — Ambulatory Visit: Payer: Medicare Other | Admitting: Cardiovascular Disease

## 2013-06-22 ENCOUNTER — Encounter: Payer: Self-pay | Admitting: Cardiovascular Disease

## 2013-06-22 ENCOUNTER — Ambulatory Visit (INDEPENDENT_AMBULATORY_CARE_PROVIDER_SITE_OTHER): Payer: Medicare Other | Admitting: Cardiovascular Disease

## 2013-06-22 VITALS — BP 98/60 | HR 108 | Ht 68.0 in | Wt 109.0 lb

## 2013-06-22 DIAGNOSIS — I739 Peripheral vascular disease, unspecified: Secondary | ICD-10-CM

## 2013-06-22 NOTE — Assessment & Plan Note (Signed)
Status post revascularization of his left common iliac artery 08/28/11 with "in-stent restenosis and iCAST re Stenting.because of progressive right lower extremity claudication and Dopplers as suggested progression of disease I re\re angiogramed him 06/01/13 revealing a patent left common iliac stent with high-grade calcified ostial right common iliac artery stenosis. There was a 60 mm gradient. Unfortunately because of perfusionist instructs beyond the ostium diamondback orbital or tissue arthrectomy would be high risk. He'll need a fem-fem crossover graft.

## 2013-06-22 NOTE — Progress Notes (Signed)
06/22/2013 Mitchell Mccall   November 29, 1950  [id number]  Primary Physician  Duane Lope, MD Primary Cardiologist: Runell Gess MD [name]   HPI:  The patient is a 62 year old thin and frail-appearing married Caucasian male, father of 3 and grandfather of 5 grandchildren, whom I last saw 8 months ago. He has a history of 45 pack years of tobacco use, smoking 1 pack per day, as well as diabetes. He also has peripheral vascular occlusive disease with a calcified occluded left common iliac which I revascularized on August 28, 2011, with subsequent Dopplers performed March 18, 2012, showing a patent stent with normal ABIs bilaterally. I did bring him back and perform iCAST stenting. He was hospitalized in September for a cholecystectomy. He has chronic pancreatitis and pancreatic pseudocyst and does continue to drink alcohol. He denies attempts at weight loss but is chronically weak and fatigued. He saw Dr. Tenny Craw last week who apparently drew blood work. He had a chest x-ray performed in September that was unremarkable.  I saw him 6 months ago and since that time he denied chest pain or shortness of breath. He does complain of some atypical shooting left lower extreme pain which does not sound like claudication. He continues to smoke one pack per day and drinks beer.  Since I saw him in the office 03/24/13 he had lowered to the Dopplers performed on 04/20/13 which revealed a right ABI of 0.750 left ABI 1.1. He did have high-frequency velocities (500 cm/s) at the origin of his right common iliac artery suggesting restenosis and continuing to his recurrent symptoms. Based on this I decided to proceed with angiography and potential intervention.I performed angiography on him 06/01/13 revealing a widely patent left common iliac artery stent the 95% ostial right common iliac artery stenosis that was highly calcified with a 60 millimeter of mercury gradient. Unfortunately the stent struts on the left  mildly protrude beyond the ostium of the left common iliac artery making rotational atherectomy problematic. She will require a left-to-right fem-fem crossover graft in for revascularization of his right lower extremity though I told him that I will not refer him to a vascular surgeon until he has stopped smoking.    Current Outpatient Prescriptions  Medication Sig Dispense Refill  . chlorproMAZINE (THORAZINE) 25 MG tablet Take 75 mg by mouth as needed.       . clopidogrel (PLAVIX) 75 MG tablet Take 1 tablet (75 mg total) by mouth daily.  30 tablet  6  . doxepin (SINEQUAN) 25 MG capsule Take 25 mg by mouth as needed.       . DULoxetine (CYMBALTA) 30 MG capsule Take 30 mg by mouth 2 (two) times daily.      Marland Kitchen gabapentin (NEURONTIN) 300 MG capsule Take 300 mg by mouth 2 (two) times daily.       . Glucose Blood (FREESTYLE LITE TEST VI)       . ibuprofen (ADVIL,MOTRIN) 200 MG tablet Take 400 mg by mouth every 6 (six) hours as needed for pain.      Marland Kitchen insulin detemir (LEVEMIR) 100 UNIT/ML injection Inject 30 Units into the skin daily.       . Multiple Vitamins-Minerals (MULTIVITAMINS THER. W/MINERALS) TABS Take 1 tablet by mouth daily.       . nitroGLYCERIN (NITROSTAT) 0.4 MG SL tablet Place 0.4 mg under the tongue every 5 (five) minutes x 3 doses as needed for chest pain.       Marland Kitchen NOVOLOG  FLEXPEN 100 UNIT/ML SOPN FlexPen 1-8 Units See admin instructions. On a Sliding Scale      . Pancrelipase, Lip-Prot-Amyl, (ZENPEP) 25000 UNITS CPEP Take 1-2 capsules by mouth 4 (four) times daily. 2 capsules with each meal and 1 capsule with a snack      . potassium chloride SA (KLOR-CON M20) 20 MEQ tablet Take 2 tablets (40 mEq total) by mouth daily.  60 tablet  3  . traMADol (ULTRAM) 50 MG tablet Take 100 mg by mouth 2 (two) times daily as needed for pain.        No current facility-administered medications for this visit.    No Known Allergies  History   Social History  . Marital Status: Married    Spouse  Name: N/A    Number of Children: N/A  . Years of Education: N/A   Occupational History  . Not on file.   Social History Main Topics  . Smoking status: Current Every Day Smoker -- 1.00 packs/day for 50 years    Types: Cigarettes  . Smokeless tobacco: Never Used  . Alcohol Use: 12.6 oz/week    21 Cans of beer per week  . Drug Use: No  . Sexual Activity: No   Other Topics Concern  . Not on file   Social History Narrative  . No narrative on file     Review of Systems: General: negative for chills, fever, night sweats or weight changes.  Cardiovascular: negative for chest pain, dyspnea on exertion, edema, orthopnea, palpitations, paroxysmal nocturnal dyspnea or shortness of breath Dermatological: negative for rash Respiratory: negative for cough or wheezing Urologic: negative for hematuria Abdominal: negative for nausea, vomiting, diarrhea, bright red blood per rectum, melena, or hematemesis Neurologic: negative for visual changes, syncope, or dizziness All other systems reviewed and are otherwise negative except as noted above.    Blood pressure 98/60, pulse 108, height 5\' 8"  (1.727 m), weight 109 lb (49.442 kg).  General appearance: alert and no distress Neck: no adenopathy, no carotid bruit, no JVD, supple, symmetrical, trachea midline and thyroid not enlarged, symmetric, no tenderness/mass/nodules Lungs: clear to auscultation bilaterally Heart: regular rate and rhythm, S1, S2 normal, no murmur, click, rub or gallop Extremities: extremities normal, atraumatic, no cyanosis or edema and bilateral femoral arterial puncture sites are well-healed  EKG not performed today  ASSESSMENT AND PLAN:   No problem-specific assessment & plan notes found for this encounter.      Runell Gess MD FACP,FACC,FAHA, [facility] 06/22/2013 2:55 PM

## 2013-06-22 NOTE — Patient Instructions (Signed)
Your physician wants you to follow-up in:  6 months. You will receive a reminder letter in the mail two months in advance. If you don't receive a letter, please call our office to schedule the follow-up appointment.   

## 2013-07-18 ENCOUNTER — Inpatient Hospital Stay (HOSPITAL_COMMUNITY)

## 2013-07-18 ENCOUNTER — Encounter (HOSPITAL_COMMUNITY): Payer: Self-pay | Admitting: Emergency Medicine

## 2013-07-18 ENCOUNTER — Inpatient Hospital Stay (HOSPITAL_COMMUNITY)
Admission: EM | Admit: 2013-07-18 | Discharge: 2013-07-25 | DRG: 637 | Disposition: A | Discharge status: Skilled Nursing Facility | Attending: Internal Medicine | Admitting: Internal Medicine

## 2013-07-18 ENCOUNTER — Emergency Department (HOSPITAL_COMMUNITY)

## 2013-07-18 DIAGNOSIS — F10231 Alcohol dependence with withdrawal delirium: Secondary | ICD-10-CM

## 2013-07-18 DIAGNOSIS — K862 Cyst of pancreas: Secondary | ICD-10-CM | POA: Diagnosis present

## 2013-07-18 DIAGNOSIS — I2510 Atherosclerotic heart disease of native coronary artery without angina pectoris: Secondary | ICD-10-CM | POA: Diagnosis present

## 2013-07-18 DIAGNOSIS — K208 Other esophagitis without bleeding: Secondary | ICD-10-CM | POA: Diagnosis present

## 2013-07-18 DIAGNOSIS — I739 Peripheral vascular disease, unspecified: Secondary | ICD-10-CM | POA: Diagnosis present

## 2013-07-18 DIAGNOSIS — F10939 Alcohol use, unspecified with withdrawal, unspecified: Secondary | ICD-10-CM | POA: Diagnosis not present

## 2013-07-18 DIAGNOSIS — E1165 Type 2 diabetes mellitus with hyperglycemia: Secondary | ICD-10-CM | POA: Diagnosis present

## 2013-07-18 DIAGNOSIS — R197 Diarrhea, unspecified: Secondary | ICD-10-CM | POA: Diagnosis present

## 2013-07-18 DIAGNOSIS — R000 Tachycardia, unspecified: Secondary | ICD-10-CM

## 2013-07-18 DIAGNOSIS — D649 Anemia, unspecified: Secondary | ICD-10-CM

## 2013-07-18 DIAGNOSIS — E876 Hypokalemia: Secondary | ICD-10-CM | POA: Diagnosis present

## 2013-07-18 DIAGNOSIS — R64 Cachexia: Secondary | ICD-10-CM | POA: Diagnosis present

## 2013-07-18 DIAGNOSIS — F3289 Other specified depressive episodes: Secondary | ICD-10-CM | POA: Diagnosis present

## 2013-07-18 DIAGNOSIS — K863 Pseudocyst of pancreas: Secondary | ICD-10-CM | POA: Diagnosis present

## 2013-07-18 DIAGNOSIS — D696 Thrombocytopenia, unspecified: Secondary | ICD-10-CM | POA: Diagnosis present

## 2013-07-18 DIAGNOSIS — IMO0002 Reserved for concepts with insufficient information to code with codable children: Secondary | ICD-10-CM | POA: Diagnosis not present

## 2013-07-18 DIAGNOSIS — R739 Hyperglycemia, unspecified: Secondary | ICD-10-CM

## 2013-07-18 DIAGNOSIS — Z7982 Long term (current) use of aspirin: Secondary | ICD-10-CM

## 2013-07-18 DIAGNOSIS — E43 Unspecified severe protein-calorie malnutrition: Secondary | ICD-10-CM | POA: Diagnosis present

## 2013-07-18 DIAGNOSIS — E861 Hypovolemia: Secondary | ICD-10-CM

## 2013-07-18 DIAGNOSIS — J45909 Unspecified asthma, uncomplicated: Secondary | ICD-10-CM | POA: Diagnosis present

## 2013-07-18 DIAGNOSIS — F17200 Nicotine dependence, unspecified, uncomplicated: Secondary | ICD-10-CM | POA: Diagnosis present

## 2013-07-18 DIAGNOSIS — E1110 Type 2 diabetes mellitus with ketoacidosis without coma: Secondary | ICD-10-CM | POA: Diagnosis present

## 2013-07-18 DIAGNOSIS — F411 Generalized anxiety disorder: Secondary | ICD-10-CM | POA: Diagnosis present

## 2013-07-18 DIAGNOSIS — K2980 Duodenitis without bleeding: Secondary | ICD-10-CM | POA: Diagnosis present

## 2013-07-18 DIAGNOSIS — Z794 Long term (current) use of insulin: Secondary | ICD-10-CM

## 2013-07-18 DIAGNOSIS — F10239 Alcohol dependence with withdrawal, unspecified: Secondary | ICD-10-CM | POA: Diagnosis not present

## 2013-07-18 DIAGNOSIS — I10 Essential (primary) hypertension: Secondary | ICD-10-CM | POA: Diagnosis present

## 2013-07-18 DIAGNOSIS — K861 Other chronic pancreatitis: Secondary | ICD-10-CM | POA: Diagnosis present

## 2013-07-18 DIAGNOSIS — E1310 Other specified diabetes mellitus with ketoacidosis without coma: Principal | ICD-10-CM | POA: Diagnosis present

## 2013-07-18 DIAGNOSIS — F329 Major depressive disorder, single episode, unspecified: Secondary | ICD-10-CM | POA: Diagnosis present

## 2013-07-18 DIAGNOSIS — K860 Alcohol-induced chronic pancreatitis: Secondary | ICD-10-CM

## 2013-07-18 DIAGNOSIS — Z833 Family history of diabetes mellitus: Secondary | ICD-10-CM

## 2013-07-18 DIAGNOSIS — K920 Hematemesis: Secondary | ICD-10-CM | POA: Diagnosis not present

## 2013-07-18 DIAGNOSIS — K219 Gastro-esophageal reflux disease without esophagitis: Secondary | ICD-10-CM | POA: Diagnosis present

## 2013-07-18 DIAGNOSIS — F1020 Alcohol dependence, uncomplicated: Secondary | ICD-10-CM | POA: Diagnosis present

## 2013-07-18 DIAGNOSIS — E872 Acidosis: Secondary | ICD-10-CM | POA: Diagnosis present

## 2013-07-18 HISTORY — DX: Encounter for palliative care: Z51.5

## 2013-07-18 LAB — COMPREHENSIVE METABOLIC PANEL
ALT: 131 U/L — ABNORMAL HIGH (ref 0–53)
AST: 264 U/L — ABNORMAL HIGH (ref 0–37)
Albumin: 4.1 g/dL (ref 3.5–5.2)
Alkaline Phosphatase: 356 U/L — ABNORMAL HIGH (ref 39–117)
BUN: 17 mg/dL (ref 6–23)
Chloride: 78 mEq/L — ABNORMAL LOW (ref 96–112)
GFR calc Af Amer: >90 mL/min (ref >90)
Glucose, Bld: 278 mg/dL — ABNORMAL HIGH (ref 70–99)
Potassium: 2.9 mEq/L — ABNORMAL LOW (ref 3.5–5.1)
Sodium: 134 mEq/L — ABNORMAL LOW (ref 135–145)
Total Bilirubin: 1.9 mg/dL — ABNORMAL HIGH (ref 0.3–1.2)
Total Protein: 8 g/dL (ref 6.0–8.3)

## 2013-07-18 LAB — POCT I-STAT 3, ART BLOOD GAS (G3+)
Acid-base deficit: 20 mmol/L — ABNORMAL HIGH (ref 0.0–2.0)
Bicarbonate: 5.7 mEq/L — ABNORMAL LOW (ref 20.0–24.0)
O2 Saturation: 98 %
pCO2 arterial: 15.4 mmHg — CL (ref 35.0–45.0)
pO2, Arterial: 128 mmHg — ABNORMAL HIGH (ref 80.0–100.0)

## 2013-07-18 LAB — CBC WITH DIFFERENTIAL/PLATELET
Basophils Relative: 0 % (ref 0–1)
Eosinophils Absolute: 0 10*3/uL (ref 0.0–0.7)
HCT: 39.1 % (ref 39.0–52.0)
Hemoglobin: 13.8 g/dL (ref 13.0–17.0)
Lymphocytes Relative: 6 % — ABNORMAL LOW (ref 12–46)
Lymphs Abs: 0.7 10*3/uL (ref 0.7–4.0)
Monocytes Relative: 10 % (ref 3–12)
Neutro Abs: 9.1 10*3/uL — ABNORMAL HIGH (ref 1.7–7.7)
Neutrophils Relative %: 83 % — ABNORMAL HIGH (ref 43–77)
Platelets: 203 10*3/uL (ref 150–400)
RBC: 3.76 MIL/uL — ABNORMAL LOW (ref 4.22–5.81)
WBC: 10.8 10*3/uL — ABNORMAL HIGH (ref 4.0–10.5)

## 2013-07-18 LAB — GLUCOSE, CAPILLARY
Glucose-Capillary: 196 mg/dL — ABNORMAL HIGH (ref 70–99)
Glucose-Capillary: 227 mg/dL — ABNORMAL HIGH (ref 70–99)
Glucose-Capillary: 255 mg/dL — ABNORMAL HIGH (ref 70–99)
Glucose-Capillary: 272 mg/dL — ABNORMAL HIGH (ref 70–99)
Glucose-Capillary: 299 mg/dL — ABNORMAL HIGH (ref 70–99)

## 2013-07-18 LAB — URINALYSIS W MICROSCOPIC + REFLEX CULTURE
Bilirubin Urine: NEGATIVE
Glucose, UA: NEGATIVE mg/dL
Hgb urine dipstick: NEGATIVE
Ketones, ur: >80 mg/dL — AB
Protein, ur: 30 mg/dL — AB
Specific Gravity, Urine: 1.015 (ref 1.005–1.030)
pH: 5.5 (ref 5.0–8.0)

## 2013-07-18 LAB — CG4 I-STAT (LACTIC ACID): Lactic Acid, Venous: 2.96 mmol/L — ABNORMAL HIGH (ref 0.5–2.2)

## 2013-07-18 LAB — ETHANOL: Alcohol, Ethyl (B): <11 mg/dL (ref 0–11)

## 2013-07-18 LAB — POCT I-STAT TROPONIN I: Troponin i, poc: 0.01 ng/mL (ref 0.00–0.08)

## 2013-07-18 MED ORDER — HEPARIN SODIUM (PORCINE) 5000 UNIT/ML IJ SOLN
5000.0000 [IU] | Freq: Three times a day (TID) | INTRAMUSCULAR | Status: DC
  Administered 2013-07-18 – 2013-07-19 (×2): 5000 [IU] via SUBCUTANEOUS
  Filled 2013-07-18 (×5): qty 1

## 2013-07-18 MED ORDER — SODIUM CHLORIDE 0.9 % IV SOLN
INTRAVENOUS | Status: AC
  Administered 2013-07-18: 22:00:00 via INTRAVENOUS

## 2013-07-18 MED ORDER — SODIUM BICARBONATE 8.4 % IV SOLN
INTRAVENOUS | Status: DC
  Filled 2013-07-18 (×2): qty 1000

## 2013-07-18 MED ORDER — SODIUM BICARBONATE 8.4 % IV SOLN
INTRAVENOUS | Status: DC
  Administered 2013-07-18: 23:00:00 via INTRAVENOUS
  Filled 2013-07-18 (×3): qty 1000

## 2013-07-18 MED ORDER — ONDANSETRON HCL 4 MG/2ML IJ SOLN
4.0000 mg | Freq: Four times a day (QID) | INTRAMUSCULAR | Status: DC | PRN
  Administered 2013-07-18 – 2013-07-19 (×3): 4 mg via INTRAVENOUS
  Filled 2013-07-18 (×3): qty 2

## 2013-07-18 MED ORDER — ACETAMINOPHEN 325 MG PO TABS
650.0000 mg | ORAL_TABLET | Freq: Once | ORAL | Status: AC
  Administered 2013-07-18: 650 mg via ORAL
  Filled 2013-07-18: qty 2

## 2013-07-18 MED ORDER — CLOPIDOGREL BISULFATE 75 MG PO TABS
75.0000 mg | ORAL_TABLET | Freq: Every day | ORAL | Status: DC
Start: 2013-07-19 — End: 2013-07-19
  Filled 2013-07-18: qty 1

## 2013-07-18 MED ORDER — HEPARIN SODIUM (PORCINE) 5000 UNIT/ML IJ SOLN: 5000.0000 [IU] | Freq: Three times a day (TID) | INTRAMUSCULAR | Status: DC

## 2013-07-18 MED ORDER — ADULT MULTIVITAMIN W/MINERALS CH: 1.0000 | ORAL_TABLET | Freq: Every day | ORAL | Status: DC

## 2013-07-18 MED ORDER — SODIUM CHLORIDE 0.9 % IV SOLN: INTRAVENOUS | Status: DC

## 2013-07-18 MED ORDER — POTASSIUM CHLORIDE 10 MEQ/100ML IV SOLN
10.0000 meq | INTRAVENOUS | Status: AC
  Administered 2013-07-18 – 2013-07-19 (×4): 10 meq via INTRAVENOUS
  Filled 2013-07-18 (×4): qty 100

## 2013-07-18 MED ORDER — DEXTROSE 50 % IV SOLN: 25.0000 mL | INTRAVENOUS | Status: DC | PRN

## 2013-07-18 MED ORDER — ONDANSETRON HCL 4 MG/2ML IJ SOLN
4.0000 mg | Freq: Once | INTRAMUSCULAR | Status: AC
  Administered 2013-07-18: 4 mg via INTRAVENOUS
  Filled 2013-07-18: qty 2

## 2013-07-18 MED ORDER — LORAZEPAM 1 MG PO TABS: 1.0000 mg | ORAL_TABLET | Freq: Four times a day (QID) | ORAL | Status: DC | PRN

## 2013-07-18 MED ORDER — SODIUM CHLORIDE 0.9 % IV BOLUS (SEPSIS)
1000.0000 mL | Freq: Once | INTRAVENOUS | Status: AC
  Administered 2013-07-18: 1000 mL via INTRAVENOUS

## 2013-07-18 MED ORDER — VITAMIN B-1 100 MG PO TABS: 100.0000 mg | ORAL_TABLET | Freq: Every day | ORAL | Status: DC

## 2013-07-18 MED ORDER — ONDANSETRON HCL 4 MG PO TABS: 4.0000 mg | ORAL_TABLET | Freq: Four times a day (QID) | ORAL | Status: DC | PRN

## 2013-07-18 MED ORDER — INSULIN REGULAR HUMAN 100 UNIT/ML IJ SOLN
INTRAMUSCULAR | Status: DC
  Administered 2013-07-18: 2.4 [IU]/h via INTRAVENOUS
  Filled 2013-07-18 (×3): qty 1

## 2013-07-18 MED ORDER — VITAMIN B-1 100 MG PO TABS
100.0000 mg | ORAL_TABLET | Freq: Every day | ORAL | Status: DC
  Filled 2013-07-18: qty 1

## 2013-07-18 MED ORDER — INSULIN REGULAR BOLUS VIA INFUSION
0.0000 [IU] | Freq: Three times a day (TID) | INTRAVENOUS | Status: DC
  Filled 2013-07-18: qty 10

## 2013-07-18 MED ORDER — DEXTROSE 50 % IV SOLN
25.0000 mL | INTRAVENOUS | Status: DC | PRN
  Administered 2013-07-20 – 2013-07-21 (×2): 50 mL via INTRAVENOUS
  Administered 2013-07-22: 25 mL via INTRAVENOUS
  Filled 2013-07-18 (×3): qty 50

## 2013-07-18 MED ORDER — POTASSIUM CHLORIDE CRYS ER 20 MEQ PO TBCR
40.0000 meq | EXTENDED_RELEASE_TABLET | Freq: Once | ORAL | Status: AC
  Administered 2013-07-18: 40 meq via ORAL
  Filled 2013-07-18: qty 2

## 2013-07-18 MED ORDER — DEXTROSE-NACL 5-0.45 % IV SOLN: INTRAVENOUS | Status: DC

## 2013-07-18 MED ORDER — DOXEPIN HCL 25 MG PO CAPS
25.0000 mg | ORAL_CAPSULE | Freq: Every evening | ORAL | Status: DC | PRN
  Filled 2013-07-18: qty 1

## 2013-07-18 MED ORDER — ALBUTEROL SULFATE (5 MG/ML) 0.5% IN NEBU
5.0000 mg | INHALATION_SOLUTION | Freq: Once | RESPIRATORY_TRACT | Status: AC
  Administered 2013-07-18: 5 mg via RESPIRATORY_TRACT
  Filled 2013-07-18: qty 1

## 2013-07-18 MED ORDER — SODIUM CHLORIDE 0.9 % IJ SOLN
3.0000 mL | Freq: Two times a day (BID) | INTRAMUSCULAR | Status: DC
  Administered 2013-07-19 – 2013-07-21 (×5): 3 mL via INTRAVENOUS

## 2013-07-18 MED ORDER — GI COCKTAIL ~~LOC~~
30.0000 mL | Freq: Once | ORAL | Status: AC
  Administered 2013-07-18: 30 mL via ORAL
  Filled 2013-07-18: qty 30

## 2013-07-18 MED ORDER — SODIUM CHLORIDE 0.9 % IV SOLN
INTRAVENOUS | Status: DC
  Filled 2013-07-18 (×2): qty 1000

## 2013-07-18 MED ORDER — THERA M PLUS PO TABS: 1.0000 | ORAL_TABLET | Freq: Every day | ORAL | Status: DC

## 2013-07-18 MED ORDER — GABAPENTIN 300 MG PO CAPS
300.0000 mg | ORAL_CAPSULE | Freq: Three times a day (TID) | ORAL | Status: DC
  Administered 2013-07-18 – 2013-07-25 (×18): 300 mg via ORAL
  Filled 2013-07-18 (×24): qty 1

## 2013-07-18 MED ORDER — SODIUM CHLORIDE 0.9 % IV SOLN
INTRAVENOUS | Status: DC
  Administered 2013-07-18: 21:00:00 via INTRAVENOUS

## 2013-07-18 MED ORDER — ASPIRIN EC 81 MG PO TBEC
81.0000 mg | DELAYED_RELEASE_TABLET | Freq: Every day | ORAL | Status: DC
  Filled 2013-07-18: qty 1

## 2013-07-18 MED ORDER — ADULT MULTIVITAMIN W/MINERALS CH
1.0000 | ORAL_TABLET | Freq: Every day | ORAL | Status: DC
  Administered 2013-07-19 – 2013-07-25 (×6): 1 via ORAL
  Filled 2013-07-18 (×7): qty 1

## 2013-07-18 MED ORDER — WHITE PETROLATUM GEL
Status: AC
  Administered 2013-07-18: 0.2
  Filled 2013-07-18: qty 5

## 2013-07-18 MED ORDER — DULOXETINE HCL 30 MG PO CPEP
30.0000 mg | ORAL_CAPSULE | Freq: Two times a day (BID) | ORAL | Status: DC
  Administered 2013-07-18 – 2013-07-25 (×13): 30 mg via ORAL
  Filled 2013-07-18 (×16): qty 1

## 2013-07-18 MED ORDER — FOLIC ACID 1 MG PO TABS
1.0000 mg | ORAL_TABLET | Freq: Every day | ORAL | Status: DC
  Filled 2013-07-18: qty 1

## 2013-07-18 MED ORDER — THIAMINE HCL 100 MG/ML IJ SOLN
100.0000 mg | Freq: Every day | INTRAMUSCULAR | Status: DC
  Administered 2013-07-19 – 2013-07-22 (×4): 100 mg via INTRAVENOUS
  Filled 2013-07-18 (×4): qty 1

## 2013-07-18 MED ORDER — ONDANSETRON HCL 4 MG/2ML IJ SOLN: 4.0000 mg | Freq: Three times a day (TID) | INTRAMUSCULAR | Status: DC | PRN

## 2013-07-18 MED ORDER — POTASSIUM CHLORIDE CRYS ER 20 MEQ PO TBCR
20.0000 meq | EXTENDED_RELEASE_TABLET | Freq: Two times a day (BID) | ORAL | Status: DC
  Filled 2013-07-18 (×2): qty 1

## 2013-07-18 MED ORDER — SODIUM CHLORIDE 0.9 % IV SOLN
INTRAVENOUS | Status: DC
  Filled 2013-07-18: qty 1

## 2013-07-18 MED ORDER — LORAZEPAM 2 MG/ML IJ SOLN: 1.0000 mg | Freq: Four times a day (QID) | INTRAMUSCULAR | Status: DC | PRN

## 2013-07-18 NOTE — Progress Notes (Addendum)
Room  [facility] RN Visit-R.Lezli Danek RN  Related admission to [facility] diagnosis of chronic pancreatitis.  Adm to hospice home care services 07/05/2013.  Pt is DNR code - has OOF DNR in the home.    Pt alert & oriented, lying on ED stretcher in hallway, with complaints of chest pain, severe heart burn and nausea.  2 sisters present, Joy & Malachi Bonds.    Patient's home medication list is on shadow chart.   Please NOTE: through hospice, there are privacy alerts - information only to pt's spouse [name] B. Yetta Barre and daughter, Nat Math - one of pt's sister's is also called [name] and no info should be given to anyone but those above.    Please call [facility] @ [phone]- ask for RN Liaison or after hours,ask for on-call RN with any hospice needs.   Thank you.  Joneen Boers, RN  [facility]  Hospice Liaison  [phone])

## 2013-07-18 NOTE — Progress Notes (Signed)
Pt transferred from ED with RN on monitor. Pt ax4, able to move all ext but weak. Pt complaining of mild generalized pain and malaise. Insulin gtt and NS bolus started. Other fluid orders clarified with MD Le. CIWA prodocol intiated. Pt states that his last drink was Sunday 11/23. He drinks approximately 6-8 beers 2-3 days per week. Will continue to monitor.

## 2013-07-18 NOTE — ED Provider Notes (Signed)
CSN: [id number]     Arrival date & time 07/18/13  1651 History   First MD Initiated Contact with Patient 07/18/13 1652     Chief Complaint  Patient presents with  . Chest Pain   (Consider location/radiation/quality/duration/timing/severity/associated sxs/prior Treatment) HPI Mitchell Mccall is a 62 y.o. male who presents to emergency department with his 2 sisters with complaint of chest pain, shortness of breath, body aches, upper respiratory type symptoms. Patient states "I think I have the flu." Patient states he began feeling bad yesterday. He states he is not eating very much, states today he had half a bottle of Ensure. Patient states she's not drinking a lot of fluids either. States he feels very weak and has difficulty getting up or walking. Patient also reports nausea, vomiting, diarrhea today. According to the 2 sisters that are in the room, patient has been put on hospice but they do not know the reason why. They state that patient's wife and daughter are powers of attorney, and states that they make patient DO NOT RESUSCITATE without him knowing that. When I asked patient about that, patient stated that he also did not know why he was on hospice and thought that the nurse that comes over to his house is there to help him with his ADLs. Patient also tells me that he does want to be resuscitated with full CPR if anything was to happen.  Past Medical History  Diagnosis Date  . Peripheral vascular disease Jan 2013, Nov 2012    LCIA stent , RCIA stent  . GERD (gastroesophageal reflux disease)   . Stomach ulcer   . Hypertension   . Anxiety   . Depression   . H/O hiatal hernia   . Anemia   . Undescended testicle   . Colon polyps   . Chronic lower back pain   . Subdural hematoma JULY 2006  . Duodenitis   . Esophagitis   . Coarse tremors   . Diverticulosis   . Rib fracture DEC 2005    Right sided from a fall  . Rectal bleed JULY 2004  . Vitamin D deficiency   . Macrocytosis   . DTs  (delirium tremens)   . Claudication in peripheral vascular disease   . Pancreatitis, alcoholic   . Fatty liver, alcoholic   . Hemorrhoids   . 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 Pancreatic pseudocyst   . Asthma   . Pneumonia DEC 2003  . Type II diabetes mellitus   . Blood dyscrasia     macrocytosis  . NWGNFAOZ(308.6)     "probably monthly" (06/01/2013)  . Seizures     "loses contact w/the world for a minute then comes back; probably q 6 months he'll have a couple of them" (06/01/2013)  . 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 Angioplasty / stenting iliac  Jan 2013, Nov 2012    LCIA stent, RCIA stent  . Ligament repair Left 1955    eg   . False aneurysm repair  09/16/2011    Procedure: REPAIR FALSE ANEURYSM;  Surgeon: Larina Earthly, MD;  Location: [facility] OR;  Service: Vascular;  Laterality: Left;  . Incision and drainage of wound      groin  . Eus  01/20/2012    Procedure: ESOPHAGEAL ENDOSCOPIC ULTRASOUND (EUS) RADIAL;  Surgeon: Willis Modena, MD;  Location: [facility] ENDOSCOPY;  Service: Endoscopy;  Laterality: N/A;  . Fine needle aspiration  01/20/2012    Procedure: FINE NEEDLE ASPIRATION (FNA) LINEAR;  Surgeon: Willis Modena, MD;  Location: [facility] ENDOSCOPY;  Service: Endoscopy;  Laterality: N/A;  . Cholecystectomy  05/06/2012    Procedure: LAPAROSCOPIC CHOLECYSTECTOMY WITH INTRAOPERATIVE CHOLANGIOGRAM;  Surgeon: Atilano Ina, MD,FACS;  Location: [facility] OR;  Service: General;  Laterality: N/A;  laparoscopic cholecystectomy with intraoperative cholangiogram  . Ligament repair Right 1990's    "had an extra ligament in & they cut it loose" (06/01/2013)   Family History  Problem Relation Age of Onset  . Diabetes Father   . Coronary artery disease Mother    History  Substance Use Topics  . Smoking status: Current Every Day Smoker -- 1.00 packs/day for 50 years    Types: Cigarettes   . Smokeless tobacco: Never Used  . Alcohol Use: 12.6 oz/week    21 Cans of beer per week    Review of Systems  Constitutional: Positive for chills, activity change, appetite change and fatigue. Negative for fever.  HENT: Positive for congestion.   Respiratory: Positive for choking, chest tightness and shortness of breath. Negative for cough.   Cardiovascular: Positive for chest pain. Negative for palpitations and leg swelling.  Gastrointestinal: Positive for nausea, vomiting and diarrhea. Negative for abdominal pain and abdominal distention.  Genitourinary: Negative for dysuria, urgency, frequency and hematuria.  Musculoskeletal: Negative for arthralgias, myalgias, neck pain and neck stiffness.  Skin: Negative for rash.  Allergic/Immunologic: Negative for immunocompromised state.  Neurological: Positive for dizziness, weakness and light-headedness. Negative for numbness and headaches.    Allergies  Review of patient's allergies indicates no known allergies.  Home Medications   Current Outpatient Rx  Name  Route  Sig  Dispense  Refill  . chlorproMAZINE (THORAZINE) 25 MG tablet   Oral   Take 75 mg by mouth as needed.          . clopidogrel (PLAVIX) 75 MG tablet   Oral   Take 1 tablet (75 mg total) by mouth daily.   30 tablet   6   . doxepin (SINEQUAN) 25 MG capsule   Oral   Take 25 mg by mouth as needed.          . DULoxetine (CYMBALTA) 30 MG capsule   Oral   Take 30 mg by mouth 2 (two) times daily.         Marland Kitchen gabapentin (NEURONTIN) 300 MG capsule   Oral   Take 300 mg by mouth 2 (two) times daily.          . Glucose Blood (FREESTYLE LITE TEST VI)               . ibuprofen (ADVIL,MOTRIN) 200 MG tablet   Oral   Take 400 mg by mouth every 6 (six) hours as needed for pain.         Marland Kitchen insulin detemir (LEVEMIR) 100 UNIT/ML injection   Subcutaneous   Inject 30 Units into the skin daily.          . Multiple Vitamins-Minerals (MULTIVITAMINS THER.  W/MINERALS) TABS   Oral   Take 1 tablet by mouth daily.          . nitroGLYCERIN (NITROSTAT) 0.4 MG SL tablet   Sublingual   Place 0.4 mg under the tongue every 5 (five) minutes x 3 doses as needed for chest pain.          Marland Kitchen NOVOLOG FLEXPEN 100 UNIT/ML SOPN FlexPen  1-8 Units See admin instructions. On a Sliding Scale         . Pancrelipase, Lip-Prot-Amyl, (ZENPEP) 25000 UNITS CPEP   Oral   Take 1-2 capsules by mouth 4 (four) times daily. 2 capsules with each meal and 1 capsule with a snack         . potassium chloride SA (KLOR-CON M20) 20 MEQ tablet   Oral   Take 2 tablets (40 mEq total) by mouth daily.   60 tablet   3   . traMADol (ULTRAM) 50 MG tablet   Oral   Take 100 mg by mouth 2 (two) times daily as needed for pain.           BP 163/77  Pulse 124  Temp(Src) 97.4 F (36.3 C) (Oral)  Resp 18  SpO2 100% Physical Exam  Nursing note and vitals reviewed. Constitutional: He is oriented to person, place, and time. He appears well-developed and well-nourished.  cachectic  HENT:  Head: Normocephalic and atraumatic.  Oral mucosa dry  Eyes: Conjunctivae are normal.  Neck: Neck supple.  Cardiovascular: Regular rhythm and normal heart sounds.   tachycardic  Pulmonary/Chest: No respiratory distress. He has wheezes. He has no rales.  Expiratory wheezes in all lung field. Tachypnea. Sitting up and leaning forward  Abdominal: Soft. Bowel sounds are normal. He exhibits no distension. There is no tenderness. There is no rebound.  Musculoskeletal: He exhibits no edema.  Neurological: He is alert and oriented to person, place, and time. No cranial nerve deficit. Coordination normal.  Skin: Skin is warm and dry.    ED Course  Procedures (including critical care time) Labs Review Labs Reviewed  CBC WITH DIFFERENTIAL - Abnormal; Notable for the following:    WBC 10.8 (*)    RBC 3.76 (*)    MCV 104.0 (*)    MCH 36.7 (*)    Neutrophils Relative % 83 (*)     Neutro Abs 9.1 (*)    Lymphocytes Relative 6 (*)    Monocytes Absolute 1.1 (*)    All other components within normal limits  COMPREHENSIVE METABOLIC PANEL - Abnormal; Notable for the following:    Sodium 134 (*)    Potassium 2.9 (*)    Chloride 78 (*)    CO2 9 (*)    Glucose, Bld 278 (*)    AST 264 (*)    ALT 131 (*)    Alkaline Phosphatase 356 (*)    Total Bilirubin 1.9 (*)    All other components within normal limits  GLUCOSE, CAPILLARY - Abnormal; Notable for the following:    Glucose-Capillary 272 (*)    All other components within normal limits  CG4 I-STAT (LACTIC ACID) - Abnormal; Notable for the following:    Lactic Acid, Venous 2.96 (*)    All other components within normal limits  URINALYSIS W MICROSCOPIC + REFLEX CULTURE  BLOOD GAS, ARTERIAL  ETHANOL  POCT I-STAT TROPONIN I   Imaging Review Dg Chest Portable 1 View  07/18/2013   CLINICAL DATA:  Shortness of breath, left-sided chest pain  EXAM: PORTABLE CHEST - 1 VIEW  COMPARISON:  December 11, 2012  FINDINGS: The heart size and mediastinal contours are within normal limits. Both lungs are clear. The visualized skeletal structures are unremarkable.  IMPRESSION: No active cardiopulmonary disease.   Electronically Signed   By: Sherian Rein M.D.   On: 07/18/2013 17:56    EKG Interpretation    Date/Time:    Ventricular Rate:  PR Interval:    QRS Duration:   QT Interval:    QTC Calculation:   R Axis:     Text Interpretation:              MDM   1. Metabolic acidosis   2. Hypokalemia   3. Tachycardia   4. Hyperglycemia     Patient is tachycardic, appears very dry on exam. He is not eating, having weakness, nausea, vomiting, diarrhea. Will get blood work, lactic acid, chest x-ray, urine. Fluids started.   7:29 PM Patient's lab work is as above. He is chloride is 78, bicarbonate is 9, blood sugar is 264. His anion gap was 47. I suspect he has a metabolic acidosis. I have sent an ABG. It is possible that  this is DKA however other causes considered such as alcoholic ketoacidosis, acidosis due to diarrhea, or anorexia dehydration. WIll admit.   Spoke with [facility], will admit.      Lottie Mussel, PA-C 07/19/13 0030

## 2013-07-18 NOTE — ED Notes (Addendum)
Rose from hospice care states that pt is a hospice pt for chronic pancreatitis. Rose also reports pt privacy alert wishes and stated she would make a note in chart. Hospice would like to be called whether pt is admitted or discharged home. [phone]

## 2013-07-18 NOTE — H&P (Signed)
[facility] History and Physical  Mitchell Mccall ZOX:[id number] DOB: November 29, 1950    PCP:    Duane Lope, MD   Chief Complaint: nausea, vomiting and abdominal pain for 3 days.  HPI: Mitchell Mccall is an 62 y.o. male with hx of chronic alcoholic pancreatitis, pancreatic pseudocyst, s/p CCY, severe vasculopath, HTN, DM2, GERD, hx of PUD, hx of seizure, prior DTs, presents to the ER with abdominal pain, nausea, vomiting and diarrhea for 3 days.  He has hx of chronic abdominal pain, and earlier this year, had CCY, but pain persisted.  He denied drinking alcohol, but record indicates otherwise.  He has no distant travel, ill contact, or recent antibiotic use.  There has been no fever, chills, coughs, black or bloody stools.  Evaluation in the ER showed increased AG metabolic acidosis, with PH of 7.1, K of 2.9. Bicarb of 9, but normal WBC and Hb.  His renal fx tests were also normal.  He has elevated LFTs to include AST 264, ALT 131, AP of 336.  His CXR was clear.  His ETOH level was negative.  Hospitalist was asked to admit him for DKA, volume depletion, severe hypokalemia, and abdominal pain.    Rewiew of Systems:  Constitutional: Negative for malaise, fever and chills.  Eyes: Negative for eye pain, redness and discharge, diplopia, visual changes, or flashes of light. ENMT: Negative for ear pain, hoarseness, nasal congestion, sinus pressure and sore throat. No headaches; tinnitus, drooling, or problem swallowing. Cardiovascular: Negative for chest pain, palpitations, diaphoresis, dyspnea and peripheral edema. ; No orthopnea, PND Respiratory: Negative for cough, hemoptysis, wheezing and stridor. No pleuritic chestpain. Gastrointestinal: Negative for melena, blood in stool, hematemesis, jaundice and rectal bleeding.    Genitourinary: Negative for frequency, dysuria, incontinence,flank pain and hematuria; Musculoskeletal: Negative for back pain and neck pain. Negative for swelling and trauma.;  Skin: .  Negative for pruritus, rash, abrasions, bruising and skin lesion.; ulcerations Neuro: Negative for headache, lightheadedness and neck stiffness. Negative for weakness, altered level of consciousness , altered mental status, extremity weakness, burning feet, involuntary movement, seizure and syncope.  Psych: negative for anxiety, depression, insomnia, tearfulness, panic attacks, hallucinations, paranoia, suicidal or homicidal ideation.  Past Medical History  Diagnosis Date  . Peripheral vascular disease Jan 2013, Nov 2012    LCIA stent , RCIA stent  . GERD (gastroesophageal reflux disease)   . Stomach ulcer   . Hypertension   . Anxiety   . Depression   . H/O hiatal hernia   . Anemia   . Undescended testicle   . Colon polyps   . Chronic lower back pain   . Subdural hematoma JULY 2006  . Duodenitis   . Esophagitis   . Coarse tremors   . Diverticulosis   . Rib fracture DEC 2005    Right sided from a fall  . Rectal bleed JULY 2004  . Vitamin D deficiency   . Macrocytosis   . DTs (delirium tremens)   . Claudication in peripheral vascular disease   . Pancreatitis, alcoholic   . Fatty liver, alcoholic   . Hemorrhoids   . 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 Pancreatic pseudocyst   . Asthma   . Pneumonia DEC 2003  . Type II diabetes mellitus   . Blood dyscrasia     macrocytosis  . WJXBJYNW(295.6)     "probably monthly" (06/01/2013)  . Seizures     "loses contact w/the world for a minute then  comes back; probably q 6 months he'll have a couple of them" (06/01/2013)  . 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 Angioplasty / stenting iliac  Jan 2013, Nov 2012    LCIA stent, RCIA stent  . Ligament repair Left 1955    eg   . False aneurysm repair  09/16/2011    Procedure: REPAIR FALSE ANEURYSM;  Surgeon: Larina Earthly, MD;  Location: [facility] OR;  Service: Vascular;   Laterality: Left;  . Incision and drainage of wound      groin  . Eus  01/20/2012    Procedure: ESOPHAGEAL ENDOSCOPIC ULTRASOUND (EUS) RADIAL;  Surgeon: Willis Modena, MD;  Location: [facility];  Service: Endoscopy;  Laterality: N/A;  . Fine needle aspiration  01/20/2012    Procedure: FINE NEEDLE ASPIRATION (FNA) LINEAR;  Surgeon: Willis Modena, MD;  Location: [facility];  Service: Endoscopy;  Laterality: N/A;  . Cholecystectomy  05/06/2012    Procedure: LAPAROSCOPIC CHOLECYSTECTOMY WITH INTRAOPERATIVE CHOLANGIOGRAM;  Surgeon: Atilano Ina, MD,FACS;  Location: [facility] OR;  Service: General;  Laterality: N/A;  laparoscopic cholecystectomy with intraoperative cholangiogram  . Ligament repair Right 1990's    "had an extra ligament in & they cut it loose" (06/01/2013)    Medications:  HOME MEDS: Prior to Admission medications   Medication Sig Start Date End Date Taking? Authorizing Provider  aspirin EC 81 MG tablet Take 81 mg by mouth daily.   Yes Historical Provider, MD  chlorproMAZINE (THORAZINE) 25 MG tablet Take 25-50 mg by mouth 4 (four) times daily as needed (spasms).  05/16/13  Yes Historical Provider, MD  clopidogrel (PLAVIX) 75 MG tablet Take 1 tablet (75 mg total) by mouth daily. 02/27/13  Yes Runell Gess, MD  doxepin (SINEQUAN) 25 MG capsule Take 25 mg by mouth at bedtime as needed (for itching and sleep).  06/12/13  Yes Historical Provider, MD  DULoxetine (CYMBALTA) 30 MG capsule Take 30 mg by mouth 2 (two) times daily.   Yes Historical Provider, MD  gabapentin (NEURONTIN) 300 MG capsule Take 300 mg by mouth 3 (three) times daily.    Yes Historical Provider, MD  insulin detemir (LEVEMIR) 100 UNIT/ML injection Inject 20 Units into the skin every morning.    Yes Historical Provider, MD  metoCLOPramide (REGLAN) 10 MG tablet Take 10 mg by mouth 4 (four) times daily as needed for nausea.   Yes Historical Provider, MD  Multiple Vitamins-Minerals (MULTIVITAMINS THER. W/MINERALS) TABS Take  1 tablet by mouth daily.    Yes Historical Provider, MD  nitroGLYCERIN (NITROSTAT) 0.4 MG SL tablet Place 0.4 mg under the tongue every 5 (five) minutes x 3 doses as needed for chest pain.  08/29/11  Yes Luke K Kilroy, PA-C  NOVOLOG FLEXPEN 100 UNIT/ML SOPN FlexPen Inject 1-12 Units into the skin See admin instructions. On a Sliding Scale 02/16/13  Yes Historical Provider, MD  potassium chloride SA (K-DUR,KLOR-CON) 20 MEQ tablet Take 20 mEq by mouth 2 (two) times daily.   Yes Historical Provider, MD  thiamine (VITAMIN B-1) 100 MG tablet Take 100 mg by mouth daily.   Yes Historical Provider, MD  traMADol (ULTRAM) 50 MG tablet Take 50 mg by mouth every 6 (six) hours as needed for moderate pain.  09/11/12  Yes Historical Provider, MD     Allergies:  No Known Allergies  Social History:   reports that he has been smoking Cigarettes.  He  has a 50 pack-year smoking history. He has never used smokeless tobacco. He reports that he drinks about 12.6 ounces of alcohol per week. He reports that he does not use illicit drugs.  Family History: Family History  Problem Relation Age of Onset  . Diabetes Father   . Coronary artery disease Mother      Physical Exam: Filed Vitals:   07/18/13 1900 07/18/13 1930 07/18/13 1951 07/18/13 2000  BP: 143/53 168/64 168/64 167/92  Pulse: 123 122 128 124  Temp:      TempSrc:      Resp: 22 18 27 24   SpO2: 100% 100% 100% 100%   Blood pressure 167/92, pulse 124, temperature 99.9 F (37.7 C), temperature source Rectal, resp. rate 24, SpO2 100.00%.  GEN:  Pleasant patient lying in the stretcher in no acute distress; cooperative with exam. He is cachectic. PSYCH:  alert and oriented x4; does not appear anxious or depressed; affect is appropriate. HEENT: Mucous membranes pink and anicteric; PERRLA; EOM intact; no cervical lymphadenopathy nor thyromegaly or carotid bruit; no JVD; There were no stridor. Neck is very supple. Breasts:: Not examined CHEST WALL: No  tenderness CHEST: Normal respiration, clear to auscultation bilaterally.  HEART: Regular rate and rhythm.  There are no murmur, rub, or gallops.   BACK: No kyphosis or scoliosis; no CVA tenderness ABDOMEN: soft and only slightly tender. no masses, no organomegaly, normal abdominal bowel sounds; no pannus; no intertriginous candida. There is no rebound and no distention. Rectal Exam: Not done EXTREMITIES: No bone or joint deformity; age-appropriate arthropathy of the hands and knees; no edema; no ulcerations.  There is no calf tenderness. Genitalia: not examined PULSES: 2+ and symmetric SKIN: Normal hydration no rash or ulceration CNS: Cranial nerves 2-12 grossly intact no focal lateralizing neurologic deficit.  Speech is fluent; uvula elevated with phonation, facial symmetry and tongue midline. DTR are normal bilaterally, cerebella exam is intact, barbinski is negative and strengths are equaled bilaterally.  No sensory loss.   Labs on Admission:  Basic Metabolic Panel:  Recent Labs Lab 07/18/13 1735  NA 134*  K 2.9*  CL 78*  CO2 9*  GLUCOSE 278*  BUN 17  CREATININE 0.50  CALCIUM 9.0   Liver Function Tests:  Recent Labs Lab 07/18/13 1735  AST 264*  ALT 131*  ALKPHOS 356*  BILITOT 1.9*  PROT 8.0  ALBUMIN 4.1   No results found for this basename: LIPASE, AMYLASE,  in the last 168 hours No results found for this basename: AMMONIA,  in the last 168 hours CBC:  Recent Labs Lab 07/18/13 1735  WBC 10.8*  NEUTROABS 9.1*  HGB 13.8  HCT 39.1  MCV 104.0*  PLT 203   Cardiac Enzymes: No results found for this basename: CKTOTAL, CKMB, CKMBINDEX, TROPONINI,  in the last 168 hours  CBG:  Recent Labs Lab 07/18/13 1758 07/18/13 2007  GLUCAP 272* 255*     Radiological Exams on Admission: Dg Chest Portable 1 View  07/18/2013   CLINICAL DATA:  Shortness of breath, left-sided chest pain  EXAM: PORTABLE CHEST - 1 VIEW  COMPARISON:  December 11, 2012  FINDINGS: The heart size  and mediastinal contours are within normal limits. Both lungs are clear. The visualized skeletal structures are unremarkable.  IMPRESSION: No active cardiopulmonary disease.   Electronically Signed   By: Sherian Rein M.D.   On: 07/18/2013 17:56    Assessment/Plan Present on Admission:  . Metabolic acidosis . DKA, type 2 . Chronic alcohol dependence,  continuous . DM (diabetes mellitus), type 2, uncontrolled with complications . Hypertension . Hypokalemia . Pancreatic pseudocyst . PVD RCIA PTA 11/12, LCIA PTA  Jan 2013 - patent left CIA stent, right CIA stenosis via PV angio 06/01/13 . Diarrhea  PLAN:  This gentleman will be admitted to SDU with DKA, severe volume depletion, metabolic acidosis from DKA, severe electrolyte derangements, with chronic protein malnutrition and chronic alcohol abuse.  He will need IVF resuscitation, electrolyte and bicarb replacement, and further evaluation of his abdominal pain with elevation of LFTs.  Will give glucose stabalizer with KCL supplement, obtain abdominal US, follow LFTs.  He is also at risk for alcohol withdrawal, and will place him on CIWA protocol.  I will send stool studies, but doubtful it is infectious.  If he doesn't improve, we will need to consult surgery.  In the interim, will give him bowel rest.  He is ill and will be admitted to SDU for further monitoring.  His BMET will be check every four hours until stabalized.  Thank you for allowing me to participate in his care.   Other plans as per orders.  Code Status: FULL Unk Lightning, MD. [facility] Pager [phone] 7pm to 7am.  07/18/2013, 9:03 PM

## 2013-07-18 NOTE — ED Notes (Addendum)
EMS reports intermittent cp that started today, pt was given 324 mg of Asprin that pt swallowed. Pt also reports n/v/d today, pt reports weakness x2 days. 4mg  of zofran given en route IM. EMS also reports that pt has hx of chronic alcohol abuse and marijuana use. EMS reports unremarkable EKG, vital signs 156/78 cbg 256 (Pt is insulin dependent and has not taken insulin today reports pt. ) rr 22 100% RA.

## 2013-07-18 NOTE — ED Notes (Signed)
Pt sisters (gloria and joy) at bedside, pt has verbalized that it is OKAY for information regarding care to be given to both family members. Pt also has verbalized that he DOES NOT WANT TO BE A DNR at this time. Pt educated on what this means. Spoke with hospice nurse, Rose, who has been made aware of pt wishes she requests that it be changed in the system.

## 2013-07-18 NOTE — ED Notes (Signed)
Daughter at bedside.

## 2013-07-18 NOTE — ED Notes (Signed)
Mitchell Mccall [phone] (Sister)

## 2013-07-19 DIAGNOSIS — E872 Acidosis: Secondary | ICD-10-CM

## 2013-07-19 DIAGNOSIS — I739 Peripheral vascular disease, unspecified: Secondary | ICD-10-CM

## 2013-07-19 DIAGNOSIS — R197 Diarrhea, unspecified: Secondary | ICD-10-CM

## 2013-07-19 DIAGNOSIS — D649 Anemia, unspecified: Secondary | ICD-10-CM

## 2013-07-19 DIAGNOSIS — E1310 Other specified diabetes mellitus with ketoacidosis without coma: Principal | ICD-10-CM

## 2013-07-19 DIAGNOSIS — E1165 Type 2 diabetes mellitus with hyperglycemia: Secondary | ICD-10-CM

## 2013-07-19 DIAGNOSIS — E43 Unspecified severe protein-calorie malnutrition: Secondary | ICD-10-CM | POA: Insufficient documentation

## 2013-07-19 DIAGNOSIS — I10 Essential (primary) hypertension: Secondary | ICD-10-CM

## 2013-07-19 LAB — BASIC METABOLIC PANEL
BUN: 16 mg/dL (ref 6–23)
BUN: 18 mg/dL (ref 6–23)
BUN: 19 mg/dL (ref 6–23)
BUN: 14 mg/dL (ref 6–23)
BUN: 12 mg/dL (ref 6–23)
CO2: 21 mEq/L (ref 19–32)
CO2: 24 mEq/L (ref 19–32)
CO2: 25 mEq/L (ref 19–32)
Calcium: 7.5 mg/dL — ABNORMAL LOW (ref 8.4–10.5)
Calcium: 8.4 mg/dL (ref 8.4–10.5)
Calcium: 7.9 mg/dL — ABNORMAL LOW (ref 8.4–10.5)
Calcium: 8.1 mg/dL — ABNORMAL LOW (ref 8.4–10.5)
Calcium: 8 mg/dL — ABNORMAL LOW (ref 8.4–10.5)
Chloride: 91 mEq/L — ABNORMAL LOW (ref 96–112)
Chloride: 98 mEq/L (ref 96–112)
Chloride: 98 mEq/L (ref 96–112)
Chloride: 100 mEq/L (ref 96–112)
Chloride: 96 mEq/L (ref 96–112)
Creatinine, Ser: 0.38 mg/dL — ABNORMAL LOW (ref 0.50–1.35)
Creatinine, Ser: 0.34 mg/dL — ABNORMAL LOW (ref 0.50–1.35)
Creatinine, Ser: 0.35 mg/dL — ABNORMAL LOW (ref 0.50–1.35)
Creatinine, Ser: 0.29 mg/dL — ABNORMAL LOW (ref 0.50–1.35)
Creatinine, Ser: 0.27 mg/dL — ABNORMAL LOW (ref 0.50–1.35)
GFR calc Af Amer: >90 mL/min (ref >90)
GFR calc Af Amer: >90 mL/min (ref >90)
GFR calc Af Amer: >90 mL/min (ref >90)
GFR calc Af Amer: >90 mL/min (ref >90)
GFR calc Af Amer: >90 mL/min (ref >90)
GFR calc non Af Amer: >90 mL/min (ref >90)
GFR calc non Af Amer: >90 mL/min (ref >90)
GFR calc non Af Amer: >90 mL/min (ref >90)
GFR calc non Af Amer: >90 mL/min (ref >90)
GFR calc non Af Amer: >90 mL/min (ref >90)
Glucose, Bld: 103 mg/dL — ABNORMAL HIGH (ref 70–99)
Glucose, Bld: 147 mg/dL — ABNORMAL HIGH (ref 70–99)
Glucose, Bld: 137 mg/dL — ABNORMAL HIGH (ref 70–99)
Glucose, Bld: 115 mg/dL — ABNORMAL HIGH (ref 70–99)
Potassium: 3.4 mEq/L — ABNORMAL LOW (ref 3.5–5.1)
Potassium: 2.9 mEq/L — ABNORMAL LOW (ref 3.5–5.1)
Potassium: 2.9 mEq/L — ABNORMAL LOW (ref 3.5–5.1)
Sodium: 136 mEq/L (ref 135–145)
Sodium: 135 mEq/L (ref 135–145)
Sodium: 138 mEq/L (ref 135–145)
Sodium: 131 mEq/L — ABNORMAL LOW (ref 135–145)

## 2013-07-19 LAB — GLUCOSE, CAPILLARY
Glucose-Capillary: 176 mg/dL — ABNORMAL HIGH (ref 70–99)
Glucose-Capillary: 180 mg/dL — ABNORMAL HIGH (ref 70–99)
Glucose-Capillary: 147 mg/dL — ABNORMAL HIGH (ref 70–99)
Glucose-Capillary: 157 mg/dL — ABNORMAL HIGH (ref 70–99)
Glucose-Capillary: 118 mg/dL — ABNORMAL HIGH (ref 70–99)
Glucose-Capillary: 122 mg/dL — ABNORMAL HIGH (ref 70–99)
Glucose-Capillary: 109 mg/dL — ABNORMAL HIGH (ref 70–99)
Glucose-Capillary: 204 mg/dL — ABNORMAL HIGH (ref 70–99)

## 2013-07-19 LAB — HEMOGLOBIN AND HEMATOCRIT, BLOOD
HCT: 29.6 % — ABNORMAL LOW (ref 39.0–52.0)
Hemoglobin: 11.8 g/dL — ABNORMAL LOW (ref 13.0–17.0)

## 2013-07-19 LAB — CBC
HCT: 34.1 % — ABNORMAL LOW (ref 39.0–52.0)
Hemoglobin: 11.7 g/dL — ABNORMAL LOW (ref 13.0–17.0)
MCV: 104.3 fL — ABNORMAL HIGH (ref 78.0–100.0)
RBC: 3.27 MIL/uL — ABNORMAL LOW (ref 4.22–5.81)
WBC: 10.8 10*3/uL — ABNORMAL HIGH (ref 4.0–10.5)

## 2013-07-19 LAB — LIPASE, BLOOD: Lipase: 10 U/L — ABNORMAL LOW (ref 11–59)

## 2013-07-19 LAB — FOLATE RBC: RBC Folate: 1387 ng/mL — ABNORMAL HIGH (ref >280)

## 2013-07-19 LAB — VITAMIN B12: Vitamin B-12: 690 pg/mL (ref 211–911)

## 2013-07-19 LAB — HEPATIC FUNCTION PANEL
Bilirubin, Direct: 0.6 mg/dL — ABNORMAL HIGH (ref 0.0–0.3)
Indirect Bilirubin: 0.7 mg/dL (ref 0.3–0.9)

## 2013-07-19 LAB — TROPONIN I
Troponin I: <0.3 ng/mL (ref <0.30)
Troponin I: <0.3 ng/mL (ref <0.30)

## 2013-07-19 LAB — LACTIC ACID, PLASMA: Lactic Acid, Venous: 2.2 mmol/L (ref 0.5–2.2)

## 2013-07-19 MED ORDER — INSULIN DETEMIR 100 UNIT/ML ~~LOC~~ SOLN
20.0000 [IU] | Freq: Every day | SUBCUTANEOUS | Status: DC
  Filled 2013-07-19: qty 0.2

## 2013-07-19 MED ORDER — PANTOPRAZOLE SODIUM 40 MG IV SOLR
40.0000 mg | Freq: Two times a day (BID) | INTRAVENOUS | Status: DC
  Administered 2013-07-19 – 2013-07-22 (×7): 40 mg via INTRAVENOUS
  Filled 2013-07-19 (×9): qty 40

## 2013-07-19 MED ORDER — FOLIC ACID 5 MG/ML IJ SOLN
1.0000 mg | Freq: Every day | INTRAMUSCULAR | Status: DC
  Administered 2013-07-19 – 2013-07-22 (×4): 1 mg via INTRAVENOUS
  Filled 2013-07-19 (×4): qty 0.2

## 2013-07-19 MED ORDER — POTASSIUM CHLORIDE 10 MEQ/50ML IV SOLN
INTRAVENOUS | Status: AC
  Administered 2013-07-19: 10 meq
  Filled 2013-07-19: qty 50

## 2013-07-19 MED ORDER — POTASSIUM CHLORIDE CRYS ER 20 MEQ PO TBCR
40.0000 meq | EXTENDED_RELEASE_TABLET | Freq: Once | ORAL | Status: AC
  Administered 2013-07-19: 40 meq via ORAL
  Filled 2013-07-19: qty 2

## 2013-07-19 MED ORDER — PROMETHAZINE HCL 25 MG/ML IJ SOLN
12.5000 mg | Freq: Four times a day (QID) | INTRAMUSCULAR | Status: DC | PRN
  Administered 2013-07-19: 25 mg via INTRAVENOUS
  Filled 2013-07-19: qty 1

## 2013-07-19 MED ORDER — SODIUM CHLORIDE 0.9 % IJ SOLN
10.0000 mL | Freq: Two times a day (BID) | INTRAMUSCULAR | Status: DC
  Administered 2013-07-19: 10 mL
  Administered 2013-07-20: 20 mL
  Administered 2013-07-20 – 2013-07-22 (×4): 10 mL

## 2013-07-19 MED ORDER — INSULIN ASPART 100 UNIT/ML ~~LOC~~ SOLN
0.0000 [IU] | Freq: Three times a day (TID) | SUBCUTANEOUS | Status: DC
  Administered 2013-07-21: 7 [IU] via SUBCUTANEOUS
  Administered 2013-07-22: 16:00:00 3 [IU] via SUBCUTANEOUS
  Administered 2013-07-22: 2 [IU] via SUBCUTANEOUS

## 2013-07-19 MED ORDER — POTASSIUM CHLORIDE 10 MEQ/100ML IV SOLN
10.0000 meq | INTRAVENOUS | Status: AC
  Administered 2013-07-19 (×3): 10 meq via INTRAVENOUS
  Filled 2013-07-19 (×3): qty 100

## 2013-07-19 MED ORDER — POTASSIUM CHLORIDE CRYS ER 20 MEQ PO TBCR: 40.0000 meq | EXTENDED_RELEASE_TABLET | Freq: Once | ORAL | Status: DC

## 2013-07-19 MED ORDER — INSULIN DETEMIR 100 UNIT/ML ~~LOC~~ SOLN
15.0000 [IU] | Freq: Every day | SUBCUTANEOUS | Status: DC
  Administered 2013-07-19: 15 [IU] via SUBCUTANEOUS
  Filled 2013-07-19: qty 0.15

## 2013-07-19 MED ORDER — POTASSIUM CHLORIDE 10 MEQ/100ML IV SOLN
10.0000 meq | INTRAVENOUS | Status: AC
  Administered 2013-07-19: 10 meq via INTRAVENOUS
  Filled 2013-07-19 (×2): qty 100

## 2013-07-19 MED ORDER — SODIUM CHLORIDE 0.9 % IJ SOLN
10.0000 mL | INTRAMUSCULAR | Status: DC | PRN
  Administered 2013-07-23 – 2013-07-25 (×4): 10 mL

## 2013-07-19 MED ORDER — SODIUM CHLORIDE 0.9 % IV BOLUS (SEPSIS)
500.0000 mL | Freq: Once | INTRAVENOUS | Status: AC
  Administered 2013-07-19: 500 mL via INTRAVENOUS

## 2013-07-19 MED ORDER — POTASSIUM CHLORIDE 10 MEQ/100ML IV SOLN
10.0000 meq | INTRAVENOUS | Status: AC
  Administered 2013-07-19 (×3): 10 meq via INTRAVENOUS
  Filled 2013-07-19: qty 100

## 2013-07-19 MED ORDER — INSULIN DETEMIR 100 UNIT/ML ~~LOC~~ SOLN: 20.0000 [IU] | Freq: Every day | SUBCUTANEOUS | Status: DC

## 2013-07-19 NOTE — Progress Notes (Signed)
Peripherally Inserted Central Catheter/Midline Placement  The IV Nurse has discussed with the patient and/or persons authorized to consent for the patient, the purpose of this procedure and the potential benefits and risks involved with this procedure.  The benefits include less needle sticks, lab draws from the catheter and patient may be discharged home with the catheter.  Risks include, but not limited to, infection, bleeding, blood clot (thrombus formation), and puncture of an artery; nerve damage and irregular heat beat.  Alternatives to this procedure were also discussed.  PICC/Midline Placement Documentation        Mitchell Mccall 07/19/2013, 2:51 PM

## 2013-07-19 NOTE — Progress Notes (Signed)
Inpatient Diabetes Program Recommendations  AACE/ADA: New Consensus Statement on Inpatient Glycemic Control (2013)  Target Ranges:  Prepandial:   less than 140 mg/dL      Peak postprandial:   less than 180 mg/dL (1-2 hours)      Critically ill patients:  140 - 180 mg/dL   Results for RUSTON, FEDORA (MRN [id number]) as of 07/19/2013 10:18  Ref. Range 07/19/2013 00:49 07/19/2013 01:54 07/19/2013 02:57 07/19/2013 03:58 07/19/2013 05:00 07/19/2013 06:13 07/19/2013 07:25 07/19/2013 08:35 07/19/2013 09:44  Glucose-Capillary Latest Range: 70-99 mg/dL 782 (H) 956 (H) 213 (H) 147 (H) 157 (H) 158 (H) 118 (H) 122 (H) 131 (H)    Inpatient Diabetes Program Recommendations Insulin - Basal: At time of transition, please consider ordering Levemir 18 units Q24H (based on 0.4 units/kg). Correction (SSI): At time of transition, please consider ordering Novolog sensitive correciton Q4H. HgbA1C: Please consider ordering an A1C to determine glycemic control over the past 2-3 months.  Last A1C in the chart was 8.0% on 05/01/2010.  Note: Patient has a history of diabetes and is prescribed Levemir 20 units QAM, Novolog 1-12 units TID as an outpatient for diabetes management.  Initial lab glucose was 278 mg/dl on 08/65 @ 78:46.  Patient noted to be in DKA with CO2 of 9 mEq/L on 11/25 @ 17:35 with anion gap of 47 (134 - (78+9)).  On labs today at 7:55 am, CO2 noted to be 22 mEq/L and anion gap 19 (139 - (98 + 22)).  Currently, patient is on an insulin drip per [employer]  for inpatient glycemic control.  At time of transition to SQ insulin, recommend starting with Levemir 18 units Q24H and Novolog sensitive correction Q4H.  Also, please order an A1C.  Will continue to follow.  NURSING:  At time of transition from IV to SQ insulin - Call MD for insulin transition orders using Glycemic Control order set including basal, correction, and meal coverage as indicated - NOTE: Basal SQ (Levemir/Lantus) insulin must be given 1-2  hours prior to discontinuation of insulin drip and Novolog SQ correction scale must be administered simultaneously when the drip is discontinued.  Thanks, Orlando Penner, RN, MSN, CCRN Diabetes Coordinator Inpatient Diabetes Program [phone] (Team Pager) [phone] ([facility] office) [phone] [facility] office)

## 2013-07-19 NOTE — Consult Note (Signed)
[facility] CONSULT Reason for consult: hematemesis Referring Physician: [facility]. PCP: Dr. Alan Ross  Mitchell Mccall is an 62 y.o. male.  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 Unfortunately, he still drinks on a regular basis and admits to drinking 6 to 8 beers at least 4 to 5 times a week. He has chronic dyspepsia and apparently takes Tums on a regular basis at home. He denies taking any other medications such as H2 blockers or PPI is at this time. I'm not certain of the extent of his liver disease. He is known to have chronic pancreatitis with pseudocyst documented by CT scan 3/14. His previously had EUS with FNA pancreatic cyst that was felt to be due to chronic alcoholic pancreatitis. CT scan earlier this year showed is stable lesion in the liver. The patient does have a history of prior cholecystectomy. His liver tests have often been elevated. He was admitted in the ER with abdominal pain, coffee ground emesis. Has history of ulcers, depression, colon polyps, previous EGD's and colonoscopies, DTs, and peripheral vascular disease. Since admission, his hemoglobin has remained 11.8. This platelet count is normal in his transaminases or elevated the total bilirubin is near normal. To the patient's history of coronary disease and vascular disease he has been on antiplatelet agents. He is being evaluated by cardiology. Were asked to see him because of his history of hematemesis. The patient denies the use of NSAIDs and denies abdominal pain  Past Medical History  Diagnosis Date  . Peripheral vascular disease Jan 2013, Nov 2012    LCIA stent , RCIA stent  . GERD (gastroesophageal reflux disease)   . Stomach ulcer   . Hypertension   . Anxiety   . Depression   . H/O hiatal hernia   . Anemia   . Undescended testicle   . Colon polyps   . Chronic lower back pain   . Subdural hematoma JULY 2006  . Duodenitis   .  Esophagitis   . Coarse tremors   . Diverticulosis   . Rib fracture DEC 2005    Right sided from a fall  . Rectal bleed JULY 2004  . Vitamin D deficiency   . Macrocytosis   . DTs (delirium tremens)   . Claudication in peripheral vascular disease   . Pancreatitis, alcoholic   . Fatty liver, alcoholic   . Hemorrhoids   . 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 Pancreatic pseudocyst   . Asthma   . Pneumonia DEC 2003  . Type II diabetes mellitus   . Blood dyscrasia     macrocytosis  . Headache(784.0)     "probably monthly" (06/01/2013)  . Seizures     "loses contact w/the world for a minute then comes back; probably q 6 months he'll have a couple of them" (06/01/2013)  . 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 Angioplasty / stenting iliac  Jan 2013, Nov 2012    LCIA stent, RCIA stent  . Ligament repair Left 1955    eg   . False aneurysm repair  09/16/2011    Procedure: REPAIR FALSE ANEURYSM;  Surgeon: Todd F Early, MD;  Location: [facility] OR;  Service: Vascular;  Laterality: Left;  . Incision and drainage of wound      groin  . Eus  01/20/2012      Procedure: ESOPHAGEAL ENDOSCOPIC ULTRASOUND (EUS) RADIAL;  Surgeon: William Outlaw, MD;  Location: [facility];  Service: Endoscopy;  Laterality: N/A;  . Fine needle aspiration  01/20/2012    Procedure: FINE NEEDLE ASPIRATION (FNA) LINEAR;  Surgeon: William Outlaw, MD;  Location: [facility];  Service: Endoscopy;  Laterality: N/A;  . Cholecystectomy  05/06/2012    Procedure: LAPAROSCOPIC CHOLECYSTECTOMY WITH INTRAOPERATIVE CHOLANGIOGRAM;  Surgeon: Eric M Wilson, MD,FACS;  Location: [facility] OR;  Service: General;  Laterality: N/A;  laparoscopic cholecystectomy with intraoperative cholangiogram  . Ligament repair Right 1990's    "had an extra ligament in & they cut it loose" (06/01/2013)    Family History  Problem Relation Age of Onset   . Diabetes Father   . Coronary artery disease Mother     Social History:  reports that he has been smoking Cigarettes.  He has a 50 pack-year smoking history. He has never used smokeless tobacco. He reports that he drinks about 12.6 ounces of alcohol per week. He reports that he does not use illicit drugs.  Allergies: No Known Allergies  Medications; Prior to Admission medications   Medication Sig Start Date End Date Taking? Authorizing Provider  aspirin EC 81 MG tablet Take 81 mg by mouth daily.   Yes Historical Provider, MD  chlorproMAZINE (THORAZINE) 25 MG tablet Take 25-50 mg by mouth 4 (four) times daily as needed (spasms).  05/16/13  Yes Historical Provider, MD  clopidogrel (PLAVIX) 75 MG tablet Take 1 tablet (75 mg total) by mouth daily. 02/27/13  Yes Jonathan J Berry, MD  doxepin (SINEQUAN) 25 MG capsule Take 25 mg by mouth at bedtime as needed (for itching and sleep).  06/12/13  Yes Historical Provider, MD  DULoxetine (CYMBALTA) 30 MG capsule Take 30 mg by mouth 2 (two) times daily.   Yes Historical Provider, MD  gabapentin (NEURONTIN) 300 MG capsule Take 300 mg by mouth 3 (three) times daily.    Yes Historical Provider, MD  insulin detemir (LEVEMIR) 100 UNIT/ML injection Inject 20 Units into the skin every morning.    Yes Historical Provider, MD  metoCLOPramide (REGLAN) 10 MG tablet Take 10 mg by mouth 4 (four) times daily as needed for nausea.   Yes Historical Provider, MD  Multiple Vitamins-Minerals (MULTIVITAMINS THER. W/MINERALS) TABS Take 1 tablet by mouth daily.    Yes Historical Provider, MD  nitroGLYCERIN (NITROSTAT) 0.4 MG SL tablet Place 0.4 mg under the tongue every 5 (five) minutes x 3 doses as needed for chest pain.  08/29/11  Yes Luke K Kilroy, PA-C  NOVOLOG FLEXPEN 100 UNIT/ML SOPN FlexPen Inject 1-12 Units into the skin See admin instructions. On a Sliding Scale 02/16/13  Yes Historical Provider, MD  potassium chloride SA (K-DUR,KLOR-CON) 20 MEQ tablet Take 20 mEq by mouth  2 (two) times daily.   Yes Historical Provider, MD  thiamine (VITAMIN B-1) 100 MG tablet Take 100 mg by mouth daily.   Yes Historical Provider, MD  traMADol (ULTRAM) 50 MG tablet Take 50 mg by mouth every 6 (six) hours as needed for moderate pain.  09/11/12  Yes Historical Provider, MD   . DULoxetine  30 mg Oral BID  . folic acid  1 mg Intravenous Daily  . gabapentin  300 mg Oral TID  . multivitamin with minerals  1 tablet Oral Daily  . pantoprazole (PROTONIX) IV  40 mg Intravenous Q12H  . potassium chloride  10 mEq Intravenous Q1 Hr x 4  . sodium chloride  10-40 mL Intracatheter Q12H  .   sodium chloride  3 mL Intravenous Q12H  . thiamine  100 mg Intravenous Daily   PRN Meds dextrose, doxepin, LORazepam, LORazepam, ondansetron (ZOFRAN) IV, ondansetron, promethazine, sodium chloride Results for orders placed during the hospital encounter of 07/18/13 (from the past 48 hour(s))  CBC WITH DIFFERENTIAL     Status: Abnormal   Collection Time    07/18/13  5:35 PM      Result Value Range   WBC 10.8 (*) 4.0 - 10.5 K/uL   RBC 3.76 (*) 4.22 - 5.81 MIL/uL   Hemoglobin 13.8  13.0 - 17.0 g/dL   HCT 39.1  39.0 - 52.0 %   MCV 104.0 (*) 78.0 - 100.0 fL   MCH 36.7 (*) 26.0 - 34.0 pg   MCHC 35.3  30.0 - 36.0 g/dL   RDW 14.3  11.5 - 15.5 %   Platelets 203  150 - 400 K/uL   Neutrophils Relative % 83 (*) 43 - 77 %   Neutro Abs 9.1 (*) 1.7 - 7.7 K/uL   Lymphocytes Relative 6 (*) 12 - 46 %   Lymphs Abs 0.7  0.7 - 4.0 K/uL   Monocytes Relative 10  3 - 12 %   Monocytes Absolute 1.1 (*) 0.1 - 1.0 K/uL   Eosinophils Relative 0  0 - 5 %   Eosinophils Absolute 0.0  0.0 - 0.7 K/uL   Basophils Relative 0  0 - 1 %   Basophils Absolute 0.0  0.0 - 0.1 K/uL  COMPREHENSIVE METABOLIC PANEL     Status: Abnormal   Collection Time    07/18/13  5:35 PM      Result Value Range   Sodium 134 (*) 135 - 145 mEq/L   Potassium 2.9 (*) 3.5 - 5.1 mEq/L   Chloride 78 (*) 96 - 112 mEq/L   CO2 9 (*) 19 - 32 mEq/L   Comment:  CRITICAL RESULT CALLED TO, READ BACK BY AND VERIFIED WITH:     RN CHAMBER, B. 1834 07/18/13 KERAN M.   Glucose, Bld 278 (*) 70 - 99 mg/dL   BUN 17  6 - 23 mg/dL   Creatinine, Ser 0.50  0.50 - 1.35 mg/dL   Calcium 9.0  8.4 - 10.5 mg/dL   Total Protein 8.0  6.0 - 8.3 g/dL   Albumin 4.1  3.5 - 5.2 g/dL   AST 264 (*) 0 - 37 U/L   ALT 131 (*) 0 - 53 U/L   Alkaline Phosphatase 356 (*) 39 - 117 U/L   Total Bilirubin 1.9 (*) 0.3 - 1.2 mg/dL   GFR calc non Af Amer >90  >90 mL/min   GFR calc Af Amer >90  >90 mL/min   Comment: (NOTE)     The eGFR has been calculated using the CKD EPI equation.     This calculation has not been validated in all clinical situations.     eGFR's persistently <90 mL/min signify possible Chronic Kidney     Disease.  POCT I-STAT TROPONIN I     Status: None   Collection Time    07/18/13  5:55 PM      Result Value Range   Troponin i, poc 0.01  0.00 - 0.08 ng/mL   Comment 3            Comment: Due to the release kinetics of cTnI,     a negative result within the first hours     of the onset of symptoms does not rule out       myocardial infarction with certainty.     If myocardial infarction is still suspected,     repeat the test at appropriate intervals.  CG4 I-STAT (LACTIC ACID)     Status: Abnormal   Collection Time    07/18/13  5:56 PM      Result Value Range   Lactic Acid, Venous 2.96 (*) 0.5 - 2.2 mmol/L  GLUCOSE, CAPILLARY     Status: Abnormal   Collection Time    07/18/13  5:58 PM      Result Value Range   Glucose-Capillary 272 (*) 70 - 99 mg/dL  ETHANOL     Status: None   Collection Time    07/18/13  7:16 PM      Result Value Range   Alcohol, Ethyl (B) <11  0 - 11 mg/dL   Comment:            LOWEST DETECTABLE LIMIT FOR     SERUM ALCOHOL IS 11 mg/dL     FOR MEDICAL PURPOSES ONLY  POCT I-STAT 3, BLOOD GAS (G3+)     Status: Abnormal   Collection Time    07/18/13  7:39 PM      Result Value Range   pH, Arterial 7.176 (*) 7.350 - 7.450   pCO2  arterial 15.4 (*) 35.0 - 45.0 mmHg   pO2, Arterial 128.0 (*) 80.0 - 100.0 mmHg   Bicarbonate 5.7 (*) 20.0 - 24.0 mEq/L   TCO2 6  0 - 100 mmol/L   O2 Saturation 98.0     Acid-base deficit 20.0 (*) 0.0 - 2.0 mmol/L   Patient temperature 99.4 F     Collection site RADIAL, ALLEN'S TEST ACCEPTABLE     Drawn by Operator     Sample type ARTERIAL     Comment NOTIFIED PHYSICIAN    URINALYSIS W MICROSCOPIC + REFLEX CULTURE     Status: Abnormal   Collection Time    07/18/13  8:05 PM      Result Value Range   Color, Urine YELLOW  YELLOW   APPearance CLEAR  CLEAR   Specific Gravity, Urine 1.015  1.005 - 1.030   pH 5.5  5.0 - 8.0   Glucose, UA NEGATIVE  NEGATIVE mg/dL   Hgb urine dipstick NEGATIVE  NEGATIVE   Bilirubin Urine NEGATIVE  NEGATIVE   Ketones, ur >80 (*) NEGATIVE mg/dL   Protein, ur 30 (*) NEGATIVE mg/dL   Urobilinogen, UA 0.2  0.0 - 1.0 mg/dL   Nitrite NEGATIVE  NEGATIVE   Leukocytes, UA NEGATIVE  NEGATIVE   WBC, UA 0-2  <3 WBC/hpf   Casts HYALINE CASTS (*) NEGATIVE  GLUCOSE, CAPILLARY     Status: Abnormal   Collection Time    07/18/13  8:07 PM      Result Value Range   Glucose-Capillary 255 (*) 70 - 99 mg/dL  GLUCOSE, CAPILLARY     Status: Abnormal   Collection Time    07/18/13  9:10 PM      Result Value Range   Glucose-Capillary 299 (*) 70 - 99 mg/dL   Comment 1 Notify RN     Comment 2 Documented in Chart    MRSA PCR SCREENING     Status: None   Collection Time    07/18/13  9:21 PM      Result Value Range   MRSA by PCR NEGATIVE  NEGATIVE   Comment:            The GeneXpert MRSA Assay (FDA       approved for NASAL specimens     only), is one component of a     comprehensive MRSA colonization     surveillance program. It is not     intended to diagnose MRSA     infection nor to guide or     monitor treatment for     MRSA infections.  CBC     Status: Abnormal   Collection Time    07/18/13  9:21 PM      Result Value Range   WBC 10.8 (*) 4.0 - 10.5 K/uL   RBC  3.27 (*) 4.22 - 5.81 MIL/uL   Hemoglobin 11.7 (*) 13.0 - 17.0 g/dL   HCT 34.1 (*) 39.0 - 52.0 %   MCV 104.3 (*) 78.0 - 100.0 fL   MCH 35.8 (*) 26.0 - 34.0 pg   MCHC 34.3  30.0 - 36.0 g/dL   RDW 14.3  11.5 - 15.5 %   Platelets 179  150 - 400 K/uL  TSH     Status: None   Collection Time    07/18/13  9:21 PM      Result Value Range   TSH 2.436  0.350 - 4.500 uIU/mL   Comment: Performed at [facility]  TROPONIN I     Status: None   Collection Time    07/18/13  9:21 PM      Result Value Range   Troponin I <0.30  <0.30 ng/mL   Comment:            Due to the release kinetics of cTnI,     a negative result within the first hours     of the onset of symptoms does not rule out     myocardial infarction with certainty.     If myocardial infarction is still suspected,     repeat the test at appropriate intervals.  LIPASE, BLOOD     Status: Abnormal   Collection Time    07/18/13  9:21 PM      Result Value Range   Lipase 10 (*) 11 - 59 U/L  VITAMIN B12     Status: None   Collection Time    07/18/13  9:21 PM      Result Value Range   Vitamin B-12 690  211 - 911 pg/mL   Comment: Performed at [facility]  FOLATE RBC     Status: Abnormal   Collection Time    07/18/13  9:21 PM      Result Value Range   RBC Folate 1387 (*) >280 ng/mL   Comment: (NOTE)     Reference range not established for pediatric patients.     ** Please note change in reference range(s). **     Performed at [facility]  BASIC METABOLIC PANEL     Status: Abnormal   Collection Time    07/18/13 10:09 PM      Result Value Range   Sodium 136  135 - 145 mEq/L   Potassium 2.4 (*) 3.5 - 5.1 mEq/L   Comment: CRITICAL RESULT CALLED TO, READ BACK BY AND VERIFIED WITH:     HITT L,RN 07/19/13 0221 WAYK   Chloride 91 (*) 96 - 112 mEq/L   CO2 <7 (*) 19 - 32 mEq/L   Comment: CRITICAL RESULT CALLED TO, READ BACK BY AND VERIFIED WITH:     HITT L,RN 07/19/13 0221 WAYK   Glucose, Bld 187 (*) 70 - 99  mg/dL   BUN 14    6 - 23 mg/dL   Creatinine, Ser 0.41 (*) 0.50 - 1.35 mg/dL   Calcium 7.5 (*) 8.4 - 10.5 mg/dL   GFR calc non Af Amer >90  >90 mL/min   GFR calc Af Amer >90  >90 mL/min   Comment: (NOTE)     The eGFR has been calculated using the CKD EPI equation.     This calculation has not been validated in all clinical situations.     eGFR's persistently <90 mL/min signify possible Chronic Kidney     Disease.  GLUCOSE, CAPILLARY     Status: Abnormal   Collection Time    07/18/13 10:37 PM      Result Value Range   Glucose-Capillary 227 (*) 70 - 99 mg/dL   Comment 1 Notify RN     Comment 2 Documented in Chart    GLUCOSE, CAPILLARY     Status: Abnormal   Collection Time    07/18/13 11:45 PM      Result Value Range   Glucose-Capillary 196 (*) 70 - 99 mg/dL  GLUCOSE, CAPILLARY     Status: Abnormal   Collection Time    07/19/13 12:49 AM      Result Value Range   Glucose-Capillary 176 (*) 70 - 99 mg/dL   Comment 1 Notify RN     Comment 2 Documented in Chart    GLUCOSE, CAPILLARY     Status: Abnormal   Collection Time    07/19/13  1:54 AM      Result Value Range   Glucose-Capillary 180 (*) 70 - 99 mg/dL  GLUCOSE, CAPILLARY     Status: Abnormal   Collection Time    07/19/13  2:57 AM      Result Value Range   Glucose-Capillary 181 (*) 70 - 99 mg/dL  GLUCOSE, CAPILLARY     Status: Abnormal   Collection Time    07/19/13  3:58 AM      Result Value Range   Glucose-Capillary 147 (*) 70 - 99 mg/dL  TROPONIN I     Status: None   Collection Time    07/19/13  4:09 AM      Result Value Range   Troponin I <0.30  <0.30 ng/mL   Comment:            Due to the release kinetics of cTnI,     a negative result within the first hours     of the onset of symptoms does not rule out     myocardial infarction with certainty.     If myocardial infarction is still suspected,     repeat the test at appropriate intervals.  BASIC METABOLIC PANEL     Status: Abnormal   Collection Time     07/19/13  4:09 AM      Result Value Range   Sodium 135  135 - 145 mEq/L   Potassium 3.4 (*) 3.5 - 5.1 mEq/L   Comment: DELTA CHECK NOTED   Chloride 98  96 - 112 mEq/L   CO2 13 (*) 19 - 32 mEq/L   Glucose, Bld 150 (*) 70 - 99 mg/dL   BUN 16  6 - 23 mg/dL   Creatinine, Ser 0.42 (*) 0.50 - 1.35 mg/dL   Calcium 7.9 (*) 8.4 - 10.5 mg/dL   GFR calc non Af Amer >90  >90 mL/min   GFR calc Af Amer >90  >90 mL/min   Comment: (NOTE)     The eGFR has been calculated using   the CKD EPI equation.     This calculation has not been validated in all clinical situations.     eGFR's persistently <90 mL/min signify possible Chronic Kidney     Disease.  GLUCOSE, CAPILLARY     Status: Abnormal   Collection Time    07/19/13  5:00 AM      Result Value Range   Glucose-Capillary 157 (*) 70 - 99 mg/dL   Comment 1 Notify RN     Comment 2 Documented in Chart    GLUCOSE, CAPILLARY     Status: Abnormal   Collection Time    07/19/13  6:13 AM      Result Value Range   Glucose-Capillary 158 (*) 70 - 99 mg/dL   Comment 1 Notify RN     Comment 2 Documented in Chart    GLUCOSE, CAPILLARY     Status: Abnormal   Collection Time    07/19/13  7:25 AM      Result Value Range   Glucose-Capillary 118 (*) 70 - 99 mg/dL   Comment 1 Notify RN    BASIC METABOLIC PANEL     Status: Abnormal   Collection Time    07/19/13  7:55 AM      Result Value Range   Sodium 139  135 - 145 mEq/L   Potassium 3.0 (*) 3.5 - 5.1 mEq/L   Chloride 98  96 - 112 mEq/L   CO2 22  19 - 32 mEq/L   Glucose, Bld 103 (*) 70 - 99 mg/dL   BUN 18  6 - 23 mg/dL   Creatinine, Ser 0.38 (*) 0.50 - 1.35 mg/dL   Calcium 8.4  8.4 - 10.5 mg/dL   GFR calc non Af Amer >90  >90 mL/min   GFR calc Af Amer >90  >90 mL/min   Comment: (NOTE)     The eGFR has been calculated using the CKD EPI equation.     This calculation has not been validated in all clinical situations.     eGFR's persistently <90 mL/min signify possible Chronic Kidney     Disease.   HEMOGLOBIN AND HEMATOCRIT, BLOOD     Status: Abnormal   Collection Time    07/19/13  7:55 AM      Result Value Range   Hemoglobin 11.8 (*) 13.0 - 17.0 g/dL   HCT 32.8 (*) 39.0 - 52.0 %  GLUCOSE, CAPILLARY     Status: Abnormal   Collection Time    07/19/13  8:35 AM      Result Value Range   Glucose-Capillary 122 (*) 70 - 99 mg/dL  GLUCOSE, CAPILLARY     Status: Abnormal   Collection Time    07/19/13  9:44 AM      Result Value Range   Glucose-Capillary 131 (*) 70 - 99 mg/dL  LACTIC ACID, PLASMA     Status: None   Collection Time    07/19/13 10:24 AM      Result Value Range   Lactic Acid, Venous 2.2  0.5 - 2.2 mmol/L  TROPONIN I     Status: None   Collection Time    07/19/13 10:25 AM      Result Value Range   Troponin I <0.30  <0.30 ng/mL   Comment:            Due to the release kinetics of cTnI,     a negative result within the first hours     of the onset of symptoms does   not rule out     myocardial infarction with certainty.     If myocardial infarction is still suspected,     repeat the test at appropriate intervals.  BASIC METABOLIC PANEL     Status: Abnormal   Collection Time    07/19/13 10:25 AM      Result Value Range   Sodium 138  135 - 145 mEq/L   Potassium 3.2 (*) 3.5 - 5.1 mEq/L   Chloride 100  96 - 112 mEq/L   CO2 21  19 - 32 mEq/L   Glucose, Bld 147 (*) 70 - 99 mg/dL   BUN 19  6 - 23 mg/dL   Creatinine, Ser 0.34 (*) 0.50 - 1.35 mg/dL   Calcium 7.9 (*) 8.4 - 10.5 mg/dL   GFR calc non Af Amer >90  >90 mL/min   GFR calc Af Amer >90  >90 mL/min   Comment: (NOTE)     The eGFR has been calculated using the CKD EPI equation.     This calculation has not been validated in all clinical situations.     eGFR's persistently <90 mL/min signify possible Chronic Kidney     Disease.  HEPATIC FUNCTION PANEL     Status: Abnormal   Collection Time    07/19/13 10:25 AM      Result Value Range   Total Protein 6.3  6.0 - 8.3 g/dL   Albumin 3.3 (*) 3.5 - 5.2 g/dL   AST  106 (*) 0 - 37 U/L   ALT 78 (*) 0 - 53 U/L   Alkaline Phosphatase 231 (*) 39 - 117 U/L   Total Bilirubin 1.3 (*) 0.3 - 1.2 mg/dL   Bilirubin, Direct 0.6 (*) 0.0 - 0.3 mg/dL   Indirect Bilirubin 0.7  0.3 - 0.9 mg/dL  GLUCOSE, CAPILLARY     Status: Abnormal   Collection Time    07/19/13 10:54 AM      Result Value Range   Glucose-Capillary 161 (*) 70 - 99 mg/dL  BASIC METABOLIC PANEL     Status: Abnormal   Collection Time    07/19/13  1:06 PM      Result Value Range   Sodium 138  135 - 145 mEq/L   Potassium 2.7 (*) 3.5 - 5.1 mEq/L   Comment: CRITICAL RESULT CALLED TO, READ BACK BY AND VERIFIED WITH:     MILLICK D RN 07/19/13 1419 COSTELLO B   Chloride 98  96 - 112 mEq/L   CO2 24  19 - 32 mEq/L   Glucose, Bld 137 (*) 70 - 99 mg/dL   BUN 19  6 - 23 mg/dL   Creatinine, Ser 0.35 (*) 0.50 - 1.35 mg/dL   Calcium 8.1 (*) 8.4 - 10.5 mg/dL   GFR calc non Af Amer >90  >90 mL/min   GFR calc Af Amer >90  >90 mL/min   Comment: (NOTE)     The eGFR has been calculated using the CKD EPI equation.     This calculation has not been validated in all clinical situations.     eGFR's persistently <90 mL/min signify possible Chronic Kidney     Disease.  GLUCOSE, CAPILLARY     Status: Abnormal   Collection Time    07/19/13  2:15 PM      Result Value Range   Glucose-Capillary 181 (*) 70 - 99 mg/dL   Comment 1 Notify RN      Us Abdomen Complete  07/19/2013   CLINICAL DATA:    Elevated liver function studies. Nausea. History of chronic pancreatitis in known fatty liver. Cholecystectomy.  EXAM: ULTRASOUND ABDOMEN COMPLETE  COMPARISON:  CT abdomen and pelvis 11/03/2012  FINDINGS: Gallbladder  Gallbladder is surgically absent.  Common bile duct  Diameter: 13.4 mm, likely normal for postoperative physiology. Stable since previous CT scan.  Liver  Diffusely increased hepatic echotexture suggesting diffuse fatty infiltration. No focal lesions are identified.  IVC  Not visualized due to overlying bowel gas.   Pancreas  Not visualized due to overlying bowel gas.  Spleen  Size and appearance within normal limits.  Right Kidney  Length: 12.4 cm. Echogenicity within normal limits. No mass or hydronephrosis visualized.  Left Kidney  Length: 11.2 cm. Echogenicity within normal limits. No mass or hydronephrosis visualized.  Abdominal aorta  Limited visualization. Visualized portions demonstrate atherosclerotic change but no evidence of aneurysm.  IMPRESSION: Surgical absence of the gallbladder. Diffuse fatty infiltration of the liver. No specific acute abnormalities demonstrated.   Electronically Signed   By: William  Stevens M.D.   On: 07/19/2013 01:20   Dg Chest Portable 1 View  07/18/2013   CLINICAL DATA:  Shortness of breath, left-sided chest pain  EXAM: PORTABLE CHEST - 1 VIEW  COMPARISON:  December 11, 2012  FINDINGS: The heart size and mediastinal contours are within normal limits. Both lungs are clear. The visualized skeletal structures are unremarkable.  IMPRESSION: No active cardiopulmonary disease.   Electronically Signed   By: Wei-Chen  Mitchell M.D.   On: 07/18/2013 17:56               Blood pressure 131/63, pulse 121, temperature 98.3 F (36.8 C), temperature source Oral, resp. rate 22, height 5' 8" (1.727 m), weight 45.7 kg (100 lb 12 oz), SpO2 100.00%.  Physical exam:   General-- thin frail white male with multiple echymoses on his arms Heart-- regular rate and rhythm without murmurs are gallops Lungs--clear Abdomen-- soft nontender with good bowel sounds no ascites   Assessment: 1. History of hematemesis. Patients hemoglobin is stable and there does not seem to be gross G.I. bleeding at this time. I don't think he has a variceal bleed. He has been taking a lot of Tums at home. This could be alcoholic gastritis, esophagitis etc. 2.  Peripheral vascular disease with questionable need for stent revision. 3. CAD with stents in place 4. Alcohol abuse with chronic pancreatitis in history  pancreatic pseudocyst. Still drinking heavily   Plan: 1. Would go ahead and support with protonix for now. After cardiology has evaluated and cleared, we will plan on EGD hopefully Friday morning. If he bleeds heavily before then will certainly do it sooner. 2. The patient needs to decide whether he is going to stop drinking. If he continues on this current course his  life expectancy is limited   Ceilidh Torregrossa JR,Jasiah Buntin L 07/19/2013, 3:25 PM      

## 2013-07-19 NOTE — Care Management Note (Signed)
    Page 1 of 1   07/25/2013     4:56:53 PM   CARE MANAGEMENT NOTE 07/25/2013  Patient:  Mitchell Mccall, Mitchell Mccall   Account Number:  [id number]  Date Initiated:  07/19/2013  Documentation initiated by:  Donn Pierini  Subjective/Objective Assessment:   Pt admitted with DKA     Action/Plan:   PTA pt lived at home and was active witih Hospice services- [facility]   Anticipated DC Date:  07/25/2013   Anticipated DC Plan:  SKILLED NURSING FACILITY  In-house referral  Clinical Social Worker      DC Planning Services  CM consult      Choice offered to / List presented to:             Status of service:  Completed, signed off Medicare Important Message given?   (If response is "NO", the following Medicare IM given date fields will be blank) Date Medicare IM given:   Date Additional Medicare IM given:    Discharge Disposition:  SKILLED NURSING FACILITY  Per UR Regulation:  Reviewed for med. necessity/level of care/duration of stay  If discussed at Long Length of Stay Meetings, dates discussed:    Comments:  07/25/13 16:55 Letha Cape RN, BSN [phone] patient dc to [facility] today, CSW following, patient decided to go to [facility] when spoke with CSW.

## 2013-07-19 NOTE — Progress Notes (Signed)
INITIAL NUTRITION ASSESSMENT  DOCUMENTATION CODES Per approved criteria  -Severe malnutrition in the context of chronic illness -Underweight   INTERVENTION: - Will wait to provide nutrition supplements until diet advanced to prevent increase in blood glucose.  - Once diet advanced, will add Glucerna shakes TID. 8 oz provides 220 kcal, 10 g protein.  - Advance diet per MD  NUTRITION DIAGNOSIS: Inadequate oral intake related to DKA as evidenced by clear liquid diet.   Goal: Patient will meet >/=90% of estimated nutrition needs  Monitor:  PO intake, weight, labs, I/Os  Reason for Assessment: Malnutrition screening tool  62 y.o. male  Admitting Dx: DKA, type 2  ASSESSMENT: Patient with history of chronic alcoholic pancreatitis, pancreatic pseudocyst, DM, GERD, and chronic malnutrition. He was admitted with DKA. Patient recently admitted in October. His weight has remained stable since then with a fair to good appetite. However, he has lost about 25 pounds in 1 year (20%) and 40 pounds over the last 4-5 years with muscle and fat tissue wasting. He does sometimes drink nutrition supplements at home.   Patient meets the criteria for severe MALNUTRITION in the context of chronic illness with 20% weight loss in 1 year and severe muscle and fat tissue loss.    Height: Ht Readings from Last 1 Encounters:  07/18/13 5\' 8"  (1.727 m)    Weight: Wt Readings from Last 1 Encounters:  07/18/13 100 lb 12 oz (45.7 kg)    Ideal Body Weight: 154 pounds  % Ideal Body Weight: 65%  Wt Readings from Last 10 Encounters:  07/18/13 100 lb 12 oz (45.7 kg)  06/22/13 109 lb (49.442 kg)  06/02/13 101 lb 6.6 oz (46 kg)  06/02/13 101 lb 6.6 oz (46 kg)  05/26/13 108 lb (48.988 kg)  03/24/13 114 lb 1.6 oz (51.755 kg)  09/15/12 115 lb (52.164 kg)  05/27/12 117 lb 12.8 oz (53.434 kg)  05/06/12 121 lb (54.885 kg)  05/06/12 121 lb (54.885 kg)    Usual Body Weight: 125 pounds  % Usual Body Weight:  80%  BMI:  Body mass index is 15.32 kg/(m^2). Patient is underweight.   Estimated Nutritional Needs: Kcal: 1450-1600 kcal Protein: 55-65 g protein Fluid: >1.5 L/day  Skin: Intact  Diet Order: Clear Liquid  EDUCATION NEEDS: -No education needs identified at this time   Intake/Output Summary (Last 24 hours) at 07/19/13 1455 Last data filed at 07/19/13 1300  Gross per 24 hour  Intake   2525 ml  Output   4500 ml  Net  -1975 ml    Last BM: 11/25   Labs:   Recent Labs Lab 07/19/13 0755 07/19/13 1025 07/19/13 1306  NA 139 138 138  K 3.0* 3.2* 2.7*  CL 98 100 98  CO2 22 21 24   BUN 18 19 19   CREATININE 0.38* 0.34* 0.35*  CALCIUM 8.4 7.9* 8.1*  GLUCOSE 103* 147* 137*    CBG (last 3)   Recent Labs  07/19/13 0944 07/19/13 1054 07/19/13 1415  GLUCAP 131* 161* 181*    Scheduled Meds: . DULoxetine  30 mg Oral BID  . folic acid  1 mg Intravenous Daily  . gabapentin  300 mg Oral TID  . multivitamin with minerals  1 tablet Oral Daily  . pantoprazole (PROTONIX) IV  40 mg Intravenous Q12H  . potassium chloride  10 mEq Intravenous Q1 Hr x 4  . potassium chloride  40 mEq Oral Once  . sodium chloride  3 mL Intravenous Q12H  .  thiamine  100 mg Intravenous Daily    Continuous Infusions: . dextrose 5 % and 0.45% NaCl    . insulin (NOVOLIN-R) infusion 4.8 Units/hr (07/19/13 0251)  . insulin (NOVOLIN-R) infusion 3.9 Units/hr (07/19/13 1610)    Past Medical History  Diagnosis Date  . Peripheral vascular disease Jan 2013, Nov 2012    LCIA stent , RCIA stent  . GERD (gastroesophageal reflux disease)   . Stomach ulcer   . Hypertension   . Anxiety   . Depression   . H/O hiatal hernia   . Anemia   . Undescended testicle   . Colon polyps   . Chronic lower back pain   . Subdural hematoma JULY 2006  . Duodenitis   . Esophagitis   . Coarse tremors   . Diverticulosis   . Rib fracture DEC 2005    Right sided from a fall  . Rectal bleed JULY 2004  . Vitamin D  deficiency   . Macrocytosis   . DTs (delirium tremens)   . Claudication in peripheral vascular disease   . Pancreatitis, alcoholic   . Fatty liver, alcoholic   . Hemorrhoids   . 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 Pancreatic pseudocyst   . Asthma   . Pneumonia DEC 2003  . Type II diabetes mellitus   . Blood dyscrasia     macrocytosis  . RUEAVWUJ(811.9)     "probably monthly" (06/01/2013)  . Seizures     "loses contact w/the world for a minute then comes back; probably q 6 months he'll have a couple of them" (06/01/2013)  . 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 Angioplasty / stenting iliac  Jan 2013, Nov 2012    LCIA stent, RCIA stent  . Ligament repair Left 1955    eg   . False aneurysm repair  09/16/2011    Procedure: REPAIR FALSE ANEURYSM;  Surgeon: Larina Earthly, MD;  Location: [facility] OR;  Service: Vascular;  Laterality: Left;  . Incision and drainage of wound      groin  . Eus  01/20/2012    Procedure: ESOPHAGEAL ENDOSCOPIC ULTRASOUND (EUS) RADIAL;  Surgeon: Willis Modena, MD;  Location: [facility] ENDOSCOPY;  Service: Endoscopy;  Laterality: N/A;  . Fine needle aspiration  01/20/2012    Procedure: FINE NEEDLE ASPIRATION (FNA) LINEAR;  Surgeon: Willis Modena, MD;  Location: [facility] ENDOSCOPY;  Service: Endoscopy;  Laterality: N/A;  . Cholecystectomy  05/06/2012    Procedure: LAPAROSCOPIC CHOLECYSTECTOMY WITH INTRAOPERATIVE CHOLANGIOGRAM;  Surgeon: Atilano Ina, MD,FACS;  Location: [facility] OR;  Service: General;  Laterality: N/A;  laparoscopic cholecystectomy with intraoperative cholangiogram  . Ligament repair Right 1990's    "had an extra ligament in & they cut it loose" (06/01/2013)    Linnell Fulling, RD, LDN Pager #: [phone] After-Hours Pager #: [phone]

## 2013-07-19 NOTE — Progress Notes (Signed)
[facility] PROGRESS NOTE  Mitchell Mccall:[id number] DOB: [birth year] DOA: 07/18/2013 PCP:  Duane Lope, MD  Assessment/Plan: 1.Metabolic Acidosis; he presents with increase anion gap metabolic acidosis. Gap at 38. Multifactorial DKA, acidosis from GI losses. Check lactic acid. Continue with B-met every 2 hours. IV fluid, insulin Gtt.  2-Nausea, vomiting, coffee ground emesis: Will start IV protonix. GI consulted. Will ask cardio if we can hold plavix. Cycle hb.  3-Transaminases: repeat LFT test today. Abdominal US: Surgical absence of the gallbladder. Diffuse fatty infiltration of the liver. No specific acute abnormalities demonstrated. GI consulted.  4-Diabetes, DKA: see # 1.  5-Alcohol use, last drink Saturday. Continue CIWA protocol.  6-Chronic pancreatitis. Might explain some component of abdominal pain.  7-Diarrhea: C diff pending. IV fluids.  8-DVT prophylaxis; SCD. Hold heparin due to GI bleed.   Code Status: Patient wishes to be a Full Code.  Family Communication: care discussed with patient.  Disposition Plan: remain in the step down.    Consultants:  GI.   Procedures:  none  Antibiotics:  none  HPI/Subjective: Nausea and vomiting since yesterday. Coffee ground emesis.  Still with abdominal pain, right upper quadrant. Patient presents with diarrhea also.   Objective: Filed Vitals:   07/19/13 0337  BP: 148/63  Pulse: 123  Temp: 97.8 F (36.6 C)  Resp: 21    Intake/Output Summary (Last 24 hours) at 07/19/13 0756 Last data filed at 07/19/13 0618  Gross per 24 hour  Intake   2525 ml  Output   1600 ml  Net    925 ml   Filed Weights   07/18/13 2105  Weight: 45.7 kg (100 lb 12 oz)    Exam:   General:  No distress.   Cardiovascular: S 1, S 2 RRR  Respiratory: CTA  Abdomen: Bs present, tenderness to palpation, limited exam patient didn't allow me to finish abdominal examination.   Musculoskeletal: no edema  Data Reviewed: Basic Metabolic  Panel:  Recent Labs Lab 07/18/13 1735 07/18/13 2209 07/19/13 0409  NA 134* 136 135  K 2.9* 2.4* 3.4*  CL 78* 91* 98  CO2 9* <7* 13*  GLUCOSE 278* 187* 150*  BUN 17 14 16   CREATININE 0.50 0.41* 0.42*  CALCIUM 9.0 7.5* 7.9*   Liver Function Tests:  Recent Labs Lab 07/18/13 1735  AST 264*  ALT 131*  ALKPHOS 356*  BILITOT 1.9*  PROT 8.0  ALBUMIN 4.1    Recent Labs Lab 07/18/13 2121  LIPASE 10*   No results found for this basename: AMMONIA,  in the last 168 hours CBC:  Recent Labs Lab 07/18/13 1735 07/18/13 2121  WBC 10.8* 10.8*  NEUTROABS 9.1*  --   HGB 13.8 11.7*  HCT 39.1 34.1*  MCV 104.0* 104.3*  PLT 203 179   Cardiac Enzymes:  Recent Labs Lab 07/18/13 2121 07/19/13 0409  TROPONINI <0.30 <0.30   BNP (last 3 results) No results found for this basename: PROBNP,  in the last 8760 hours CBG:  Recent Labs Lab 07/19/13 0154 07/19/13 0257 07/19/13 0358 07/19/13 0500 07/19/13 0613  GLUCAP 180* 181* 147* 157* 158*    Recent Results (from the past 240 hour(s))  MRSA PCR SCREENING     Status: None   Collection Time    07/18/13  9:21 PM      Result Value Range Status   MRSA by PCR NEGATIVE  NEGATIVE Final   Comment:            The GeneXpert MRSA  Assay (FDA     approved for NASAL specimens     only), is one component of a     comprehensive MRSA colonization     surveillance program. It is not     intended to diagnose MRSA     infection nor to guide or     monitor treatment for     MRSA infections.     Studies: US Abdomen Complete  07/19/2013   CLINICAL DATA:  Elevated liver function studies. Nausea. History of chronic pancreatitis in known fatty liver. Cholecystectomy.  EXAM: ULTRASOUND ABDOMEN COMPLETE  COMPARISON:  CT abdomen and pelvis 11/03/2012  FINDINGS: Gallbladder  Gallbladder is surgically absent.  Common bile duct  Diameter: 13.4 mm, likely normal for postoperative physiology. Stable since previous CT scan.  Liver  Diffusely  increased hepatic echotexture suggesting diffuse fatty infiltration. No focal lesions are identified.  IVC  Not visualized due to overlying bowel gas.  Pancreas  Not visualized due to overlying bowel gas.  Spleen  Size and appearance within normal limits.  Right Kidney  Length: 12.4 cm. Echogenicity within normal limits. No mass or hydronephrosis visualized.  Left Kidney  Length: 11.2 cm. Echogenicity within normal limits. No mass or hydronephrosis visualized.  Abdominal aorta  Limited visualization. Visualized portions demonstrate atherosclerotic change but no evidence of aneurysm.  IMPRESSION: Surgical absence of the gallbladder. Diffuse fatty infiltration of the liver. No specific acute abnormalities demonstrated.   Electronically Signed   By: Burman Nieves M.D.   On: 07/19/2013 01:20   Dg Chest Portable 1 View  07/18/2013   CLINICAL DATA:  Shortness of breath, left-sided chest pain  EXAM: PORTABLE CHEST - 1 VIEW  COMPARISON:  December 11, 2012  FINDINGS: The heart size and mediastinal contours are within normal limits. Both lungs are clear. The visualized skeletal structures are unremarkable.  IMPRESSION: No active cardiopulmonary disease.   Electronically Signed   By: Sherian Rein M.D.   On: 07/18/2013 17:56    Scheduled Meds: . aspirin EC  81 mg Oral Daily  . clopidogrel  75 mg Oral Daily  . DULoxetine  30 mg Oral BID  . folic acid  1 mg Oral Daily  . gabapentin  300 mg Oral TID  . heparin  5,000 Units Subcutaneous Q8H  . insulin regular  0-10 Units Intravenous TID WC  . multivitamin with minerals  1 tablet Oral Daily  . potassium chloride SA  20 mEq Oral BID  . sodium chloride  3 mL Intravenous Q12H  . thiamine  100 mg Oral Daily   Or  . thiamine  100 mg Intravenous Daily   Continuous Infusions: . dextrose 5 % and 0.9% NaCl 1,000 mL with potassium chloride 40 mEq, sodium bicarbonate 50 mEq infusion 125 mL/hr at 07/18/13 2319  . dextrose 5 % and 0.45% NaCl    . dextrose 5 % and 0.45%  NaCl    . insulin (NOVOLIN-R) infusion 4.8 Units/hr (07/19/13 0251)  . insulin (NOVOLIN-R) infusion 3.9 Units/hr (07/19/13 0618)    Principal Problem:   DKA, type 2 Active Problems:   PVD RCIA PTA 11/12, LCIA PTA  Jan 2013 - patent left CIA stent, right CIA stenosis via PV angio 06/01/13   Hypertension   Pancreatic pseudocyst   DM (diabetes mellitus), type 2, uncontrolled with complications   Chronic alcohol dependence, continuous   Hypokalemia   Metabolic acidosis   Diarrhea   Intravascular volume depletion    Time spent: 35 minutes.  Kelliann Pendergraph  [facility] Pager [phone]. If 7PM-7AM, please contact night-coverage at [email or web address], password [facility] 07/19/2013, 7:56 AM  LOS: 1 day

## 2013-07-19 NOTE — Progress Notes (Signed)
Room [facility] 3S 01 - Mitchell Mccall - [facility] RN Visit-R.Mitchell Musquiz RN  Related admission to [facility] diagnosis of chronic pancreatitis.  Pt was changed to FULL code in ED by pt's request.    Pt alert & oriented, lying in bed, with complaints of upper abdominal pain and lack of sleep over the night.    No family present. Patient's home medication list is on shadow chart.   Per staff RN, pt to be seen by GI today due to N&V coffee ground emesis,  Pt is on insulin drip, on CIWA precautions.  Please call [facility] @ [phone]- ask for RN Liaison or after hours,ask for on-call RN with any hospice needs.   Thank you.  Joneen Boers, RN  [facility]  Hospice Liaison  [phone])

## 2013-07-19 NOTE — Consult Note (Signed)
Reason for Consult:  Can plavix be held.  Referring Physician:   HANSON MEDEIROS is an 62 y.o. male.  HPI:  The patient is a 62 y/o thin appearing Caucasian male, followed by Dr. Allyson Sabal. He has a history of 45 pack years of tobacco use, smoking 1 pack per day, as well as diabetes, ETOH abuse. He also has peripheral vascular occlusive disease with a calcified occluded left common iliac, which Dr. Allyson Sabal revascularized on August 28, 2011, with subsequent Dopplers performed March 18, 2012, showing a patent stent with normal ABIs bilaterally. Dr. Allyson Sabal later, brought him back and performed iCAST stenting.  He was seen recently in clinic for routine follow-up and the patient complained of right lower extremity claudication. Dopplers in the office on 04/20/13 revealed a right ABI of 0.75 and a left ABI of 1.1. He did have high-frequency velocities (500 cm/s) at the origin of his right common iliac artery suggesting restenosis which was consistent to his recurrent symptoms. Based on this, Dr. Allyson Sabal decided to proceed with angiography and potential intervention. This was completed on 06/01/13 for the procedure.  The stent in the origin of the left common iliac artery was widely patent. However, the ostium of the right common iliac artery had a complex calcified high-grade stenosis with a 60 mm gradient. Unfortunately, a catheter could not pass through the segment. The procedure was terminated, as it was felt that he would require orbital roational atherectomy to debulk prior stenting.   The patient presented yesterday with N,V and abd pain for three days and was found to be volume depleted, in metabolic acidosis from DKA, coffee-ground emesis.  Currently complaining of "spasms" in his chest/abd with pain in the left arm, abd pain,  Hematochezia,  shortness of breath,  melena.  He currently denies fever, orthopnea, dizziness, PND, cough, congestion, abdominal pain, lower extremity edema.   Past Medical History  Diagnosis  Date  . Peripheral vascular disease Jan 2013, Nov 2012    LCIA stent , RCIA stent  . GERD (gastroesophageal reflux disease)   . Stomach ulcer   . Hypertension   . Anxiety   . Depression   . H/O hiatal hernia   . Anemia   . Undescended testicle   . Colon polyps   . Chronic lower back pain   . Subdural hematoma JULY 2006  . Duodenitis   . Esophagitis   . Coarse tremors   . Diverticulosis   . Rib fracture DEC 2005    Right sided from a fall  . Rectal bleed JULY 2004  . Vitamin D deficiency   . Macrocytosis   . DTs (delirium tremens)   . Claudication in peripheral vascular disease   . Pancreatitis, alcoholic   . Fatty liver, alcoholic   . Hemorrhoids   . 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 Pancreatic pseudocyst   . Asthma   . Pneumonia DEC 2003  . Type II diabetes mellitus   . Blood dyscrasia     macrocytosis  . OZHYQMVH(846.9)     "probably monthly" (06/01/2013)  . Seizures     "loses contact w/the world for a minute then comes back; probably q 6 months he'll have a couple of them" (06/01/2013)  . 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 Angioplasty / stenting iliac  Jan 2013, Nov 2012    LCIA stent, RCIA stent  .  Ligament repair Left 1955    eg   . False aneurysm repair  09/16/2011    Procedure: REPAIR FALSE ANEURYSM;  Surgeon: Larina Earthly, MD;  Location: [facility] OR;  Service: Vascular;  Laterality: Left;  . Incision and drainage of wound      groin  . Eus  01/20/2012    Procedure: ESOPHAGEAL ENDOSCOPIC ULTRASOUND (EUS) RADIAL;  Surgeon: Willis Modena, MD;  Location: [facility]COPY;  Service: Endoscopy;  Laterality: N/A;  . Fine needle aspiration  01/20/2012    Procedure: FINE NEEDLE ASPIRATION (FNA) LINEAR;  Surgeon: Willis Modena, MD;  Location: [facility];  Service: Endoscopy;  Laterality: N/A;  . Cholecystectomy  05/06/2012    Procedure: LAPAROSCOPIC  CHOLECYSTECTOMY WITH INTRAOPERATIVE CHOLANGIOGRAM;  Surgeon: Atilano Ina, MD,FACS;  Location: [facility] OR;  Service: General;  Laterality: N/A;  laparoscopic cholecystectomy with intraoperative cholangiogram  . Ligament repair Right 1990's    "had an extra ligament in & they cut it loose" (06/01/2013)    Family History  Problem Relation Age of Onset  . Diabetes Father   . Coronary artery disease Mother     Social History:  reports that he has been smoking Cigarettes.  He has a 50 pack-year smoking history. He has never used smokeless tobacco. He reports that he drinks about 12.6 ounces of alcohol per week. He reports that he does not use illicit drugs.  Allergies: No Known Allergies  Medications:  Scheduled Meds: . clopidogrel  75 mg Oral Daily  . DULoxetine  30 mg Oral BID  . folic acid  1 mg Intravenous Daily  . gabapentin  300 mg Oral TID  . multivitamin with minerals  1 tablet Oral Daily  . pantoprazole (PROTONIX) IV  40 mg Intravenous Q12H  . potassium chloride  10 mEq Intravenous Q1 Hr x 4  . sodium chloride  3 mL Intravenous Q12H  . thiamine  100 mg Intravenous Daily   Continuous Infusions: . dextrose 5 % and 0.45% NaCl    . insulin (NOVOLIN-R) infusion 4.8 Units/hr (07/19/13 0251)  . insulin (NOVOLIN-R) infusion 3.9 Units/hr (07/19/13 0618)   PRN Meds:.dextrose, doxepin, LORazepam, LORazepam, ondansetron (ZOFRAN) IV, ondansetron, promethazine   Results for orders placed during the hospital encounter of 07/18/13 (from the past 48 hour(s))  CBC WITH DIFFERENTIAL     Status: Abnormal   Collection Time    07/18/13  5:35 PM      Result Value Range   WBC 10.8 (*) 4.0 - 10.5 K/uL   RBC 3.76 (*) 4.22 - 5.81 MIL/uL   Hemoglobin 13.8  13.0 - 17.0 g/dL   HCT 16.1  09.6 - 04.5 %   MCV 104.0 (*) 78.0 - 100.0 fL   MCH 36.7 (*) 26.0 - 34.0 pg   MCHC 35.3  30.0 - 36.0 g/dL   RDW 40.9  81.1 - 91.4 %   Platelets 203  150 - 400 K/uL   Neutrophils Relative % 83 (*) 43 - 77 %   Neutro  Abs 9.1 (*) 1.7 - 7.7 K/uL   Lymphocytes Relative 6 (*) 12 - 46 %   Lymphs Abs 0.7  0.7 - 4.0 K/uL   Monocytes Relative 10  3 - 12 %   Monocytes Absolute 1.1 (*) 0.1 - 1.0 K/uL   Eosinophils Relative 0  0 - 5 %   Eosinophils Absolute 0.0  0.0 - 0.7 K/uL   Basophils Relative 0  0 - 1 %   Basophils Absolute 0.0  0.0 -  0.1 K/uL  COMPREHENSIVE METABOLIC PANEL     Status: Abnormal   Collection Time    07/18/13  5:35 PM      Result Value Range   Sodium 134 (*) 135 - 145 mEq/L   Potassium 2.9 (*) 3.5 - 5.1 mEq/L   Chloride 78 (*) 96 - 112 mEq/L   CO2 9 (*) 19 - 32 mEq/L   Comment: CRITICAL RESULT CALLED TO, READ BACK BY AND VERIFIED WITH:     RN CHAMBER, B. 1834 07/18/13 Riki Rusk.   Glucose, Bld 278 (*) 70 - 99 mg/dL   BUN 17  6 - 23 mg/dL   Creatinine, Ser 1.61  0.50 - 1.35 mg/dL   Calcium 9.0  8.4 - 09.6 mg/dL   Total Protein 8.0  6.0 - 8.3 g/dL   Albumin 4.1  3.5 - 5.2 g/dL   AST 045 (*) 0 - 37 U/L   ALT 131 (*) 0 - 53 U/L   Alkaline Phosphatase 356 (*) 39 - 117 U/L   Total Bilirubin 1.9 (*) 0.3 - 1.2 mg/dL   GFR calc non Af Amer >90  >90 mL/min   GFR calc Af Amer >90  >90 mL/min   Comment: (NOTE)     The eGFR has been calculated using the CKD EPI equation.     This calculation has not been validated in all clinical situations.     eGFR's persistently <90 mL/min signify possible Chronic Kidney     Disease.  POCT I-STAT TROPONIN I     Status: None   Collection Time    07/18/13  5:55 PM      Result Value Range   Troponin i, poc 0.01  0.00 - 0.08 ng/mL   Comment 3            Comment: Due to the release kinetics of cTnI,     a negative result within the first hours     of the onset of symptoms does not rule out     myocardial infarction with certainty.     If myocardial infarction is still suspected,     repeat the test at appropriate intervals.  CG4 I-STAT (LACTIC ACID)     Status: Abnormal   Collection Time    07/18/13  5:56 PM      Result Value Range   Lactic Acid,  Venous 2.96 (*) 0.5 - 2.2 mmol/L  GLUCOSE, CAPILLARY     Status: Abnormal   Collection Time    07/18/13  5:58 PM      Result Value Range   Glucose-Capillary 272 (*) 70 - 99 mg/dL  ETHANOL     Status: None   Collection Time    07/18/13  7:16 PM      Result Value Range   Alcohol, Ethyl (B) <11  0 - 11 mg/dL   Comment:            LOWEST DETECTABLE LIMIT FOR     SERUM ALCOHOL IS 11 mg/dL     FOR MEDICAL PURPOSES ONLY  POCT I-STAT 3, BLOOD GAS (G3+)     Status: Abnormal   Collection Time    07/18/13  7:39 PM      Result Value Range   pH, Arterial 7.176 (*) 7.350 - 7.450   pCO2 arterial 15.4 (*) 35.0 - 45.0 mmHg   pO2, Arterial 128.0 (*) 80.0 - 100.0 mmHg   Bicarbonate 5.7 (*) 20.0 - 24.0 mEq/L   TCO2 6  0 -  100 mmol/L   O2 Saturation 98.0     Acid-base deficit 20.0 (*) 0.0 - 2.0 mmol/L   Patient temperature 99.4 F     Collection site RADIAL, ALLEN'S TEST ACCEPTABLE     Drawn by Operator     Sample type ARTERIAL     Comment NOTIFIED PHYSICIAN    URINALYSIS W MICROSCOPIC + REFLEX CULTURE     Status: Abnormal   Collection Time    07/18/13  8:05 PM      Result Value Range   Color, Urine YELLOW  YELLOW   APPearance CLEAR  CLEAR   Specific Gravity, Urine 1.015  1.005 - 1.030   pH 5.5  5.0 - 8.0   Glucose, UA NEGATIVE  NEGATIVE mg/dL   Hgb urine dipstick NEGATIVE  NEGATIVE   Bilirubin Urine NEGATIVE  NEGATIVE   Ketones, ur >80 (*) NEGATIVE mg/dL   Protein, ur 30 (*) NEGATIVE mg/dL   Urobilinogen, UA 0.2  0.0 - 1.0 mg/dL   Nitrite NEGATIVE  NEGATIVE   Leukocytes, UA NEGATIVE  NEGATIVE   WBC, UA 0-2  <3 WBC/hpf   Casts HYALINE CASTS (*) NEGATIVE  GLUCOSE, CAPILLARY     Status: Abnormal   Collection Time    07/18/13  8:07 PM      Result Value Range   Glucose-Capillary 255 (*) 70 - 99 mg/dL  GLUCOSE, CAPILLARY     Status: Abnormal   Collection Time    07/18/13  9:10 PM      Result Value Range   Glucose-Capillary 299 (*) 70 - 99 mg/dL   Comment 1 Notify RN     Comment 2  Documented in Chart    MRSA PCR SCREENING     Status: None   Collection Time    07/18/13  9:21 PM      Result Value Range   MRSA by PCR NEGATIVE  NEGATIVE   Comment:            The GeneXpert MRSA Assay (FDA     approved for NASAL specimens     only), is one component of a     comprehensive MRSA colonization     surveillance program. It is not     intended to diagnose MRSA     infection nor to guide or     monitor treatment for     MRSA infections.  CBC     Status: Abnormal   Collection Time    07/18/13  9:21 PM      Result Value Range   WBC 10.8 (*) 4.0 - 10.5 K/uL   RBC 3.27 (*) 4.22 - 5.81 MIL/uL   Hemoglobin 11.7 (*) 13.0 - 17.0 g/dL   HCT 52.8 (*) 41.3 - 24.4 %   MCV 104.3 (*) 78.0 - 100.0 fL   MCH 35.8 (*) 26.0 - 34.0 pg   MCHC 34.3  30.0 - 36.0 g/dL   RDW 01.0  27.2 - 53.6 %   Platelets 179  150 - 400 K/uL  TSH     Status: None   Collection Time    07/18/13  9:21 PM      Result Value Range   TSH 2.436  0.350 - 4.500 uIU/mL   Comment: Performed at [facility]  TROPONIN I     Status: None   Collection Time    07/18/13  9:21 PM      Result Value Range   Troponin I <0.30  <0.30 ng/mL  Comment:            Due to the release kinetics of cTnI,     a negative result within the first hours     of the onset of symptoms does not rule out     myocardial infarction with certainty.     If myocardial infarction is still suspected,     repeat the test at appropriate intervals.  LIPASE, BLOOD     Status: Abnormal   Collection Time    07/18/13  9:21 PM      Result Value Range   Lipase 10 (*) 11 - 59 U/L  VITAMIN B12     Status: None   Collection Time    07/18/13  9:21 PM      Result Value Range   Vitamin B-12 690  211 - 911 pg/mL   Comment: Performed at [facility]  BASIC METABOLIC PANEL     Status: Abnormal   Collection Time    07/18/13 10:09 PM      Result Value Range   Sodium 136  135 - 145 mEq/L   Potassium 2.4 (*) 3.5 - 5.1 mEq/L   Comment:  CRITICAL RESULT CALLED TO, READ BACK BY AND VERIFIED WITH:     HITT L,RN 07/19/13 0221 WAYK   Chloride 91 (*) 96 - 112 mEq/L   CO2 <7 (*) 19 - 32 mEq/L   Comment: CRITICAL RESULT CALLED TO, READ BACK BY AND VERIFIED WITH:     HITT L,RN 07/19/13 0221 WAYK   Glucose, Bld 187 (*) 70 - 99 mg/dL   BUN 14  6 - 23 mg/dL   Creatinine, Ser 4.09 (*) 0.50 - 1.35 mg/dL   Calcium 7.5 (*) 8.4 - 10.5 mg/dL   GFR calc non Af Amer >90  >90 mL/min   GFR calc Af Amer >90  >90 mL/min   Comment: (NOTE)     The eGFR has been calculated using the CKD EPI equation.     This calculation has not been validated in all clinical situations.     eGFR's persistently <90 mL/min signify possible Chronic Kidney     Disease.  GLUCOSE, CAPILLARY     Status: Abnormal   Collection Time    07/18/13 10:37 PM      Result Value Range   Glucose-Capillary 227 (*) 70 - 99 mg/dL   Comment 1 Notify RN     Comment 2 Documented in Chart    GLUCOSE, CAPILLARY     Status: Abnormal   Collection Time    07/18/13 11:45 PM      Result Value Range   Glucose-Capillary 196 (*) 70 - 99 mg/dL  GLUCOSE, CAPILLARY     Status: Abnormal   Collection Time    07/19/13 12:49 AM      Result Value Range   Glucose-Capillary 176 (*) 70 - 99 mg/dL   Comment 1 Notify RN     Comment 2 Documented in Chart    GLUCOSE, CAPILLARY     Status: Abnormal   Collection Time    07/19/13  1:54 AM      Result Value Range   Glucose-Capillary 180 (*) 70 - 99 mg/dL  GLUCOSE, CAPILLARY     Status: Abnormal   Collection Time    07/19/13  2:57 AM      Result Value Range   Glucose-Capillary 181 (*) 70 - 99 mg/dL  GLUCOSE, CAPILLARY     Status: Abnormal   Collection Time  07/19/13  3:58 AM      Result Value Range   Glucose-Capillary 147 (*) 70 - 99 mg/dL  TROPONIN I     Status: None   Collection Time    07/19/13  4:09 AM      Result Value Range   Troponin I <0.30  <0.30 ng/mL   Comment:            Due to the release kinetics of cTnI,     a negative  result within the first hours     of the onset of symptoms does not rule out     myocardial infarction with certainty.     If myocardial infarction is still suspected,     repeat the test at appropriate intervals.  BASIC METABOLIC PANEL     Status: Abnormal   Collection Time    07/19/13  4:09 AM      Result Value Range   Sodium 135  135 - 145 mEq/L   Potassium 3.4 (*) 3.5 - 5.1 mEq/L   Comment: DELTA CHECK NOTED   Chloride 98  96 - 112 mEq/L   CO2 13 (*) 19 - 32 mEq/L   Glucose, Bld 150 (*) 70 - 99 mg/dL   BUN 16  6 - 23 mg/dL   Creatinine, Ser 1.61 (*) 0.50 - 1.35 mg/dL   Calcium 7.9 (*) 8.4 - 10.5 mg/dL   GFR calc non Af Amer >90  >90 mL/min   GFR calc Af Amer >90  >90 mL/min   Comment: (NOTE)     The eGFR has been calculated using the CKD EPI equation.     This calculation has not been validated in all clinical situations.     eGFR's persistently <90 mL/min signify possible Chronic Kidney     Disease.  GLUCOSE, CAPILLARY     Status: Abnormal   Collection Time    07/19/13  5:00 AM      Result Value Range   Glucose-Capillary 157 (*) 70 - 99 mg/dL   Comment 1 Notify RN     Comment 2 Documented in Chart    GLUCOSE, CAPILLARY     Status: Abnormal   Collection Time    07/19/13  6:13 AM      Result Value Range   Glucose-Capillary 158 (*) 70 - 99 mg/dL   Comment 1 Notify RN     Comment 2 Documented in Chart    GLUCOSE, CAPILLARY     Status: Abnormal   Collection Time    07/19/13  7:25 AM      Result Value Range   Glucose-Capillary 118 (*) 70 - 99 mg/dL   Comment 1 Notify RN    BASIC METABOLIC PANEL     Status: Abnormal   Collection Time    07/19/13  7:55 AM      Result Value Range   Sodium 139  135 - 145 mEq/L   Potassium 3.0 (*) 3.5 - 5.1 mEq/L   Chloride 98  96 - 112 mEq/L   CO2 22  19 - 32 mEq/L   Glucose, Bld 103 (*) 70 - 99 mg/dL   BUN 18  6 - 23 mg/dL   Creatinine, Ser 0.96 (*) 0.50 - 1.35 mg/dL   Calcium 8.4  8.4 - 04.5 mg/dL   GFR calc non Af Amer >90  >90  mL/min   GFR calc Af Amer >90  >90 mL/min   Comment: (NOTE)     The eGFR has been  calculated using the CKD EPI equation.     This calculation has not been validated in all clinical situations.     eGFR's persistently <90 mL/min signify possible Chronic Kidney     Disease.  HEMOGLOBIN AND HEMATOCRIT, BLOOD     Status: Abnormal   Collection Time    07/19/13  7:55 AM      Result Value Range   Hemoglobin 11.8 (*) 13.0 - 17.0 g/dL   HCT 16.1 (*) 09.6 - 04.5 %  GLUCOSE, CAPILLARY     Status: Abnormal   Collection Time    07/19/13  8:35 AM      Result Value Range   Glucose-Capillary 122 (*) 70 - 99 mg/dL  GLUCOSE, CAPILLARY     Status: Abnormal   Collection Time    07/19/13  9:44 AM      Result Value Range   Glucose-Capillary 131 (*) 70 - 99 mg/dL  LACTIC ACID, PLASMA     Status: None   Collection Time    07/19/13 10:24 AM      Result Value Range   Lactic Acid, Venous 2.2  0.5 - 2.2 mmol/L  TROPONIN I     Status: None   Collection Time    07/19/13 10:25 AM      Result Value Range   Troponin I <0.30  <0.30 ng/mL   Comment:            Due to the release kinetics of cTnI,     a negative result within the first hours     of the onset of symptoms does not rule out     myocardial infarction with certainty.     If myocardial infarction is still suspected,     repeat the test at appropriate intervals.  BASIC METABOLIC PANEL     Status: Abnormal   Collection Time    07/19/13 10:25 AM      Result Value Range   Sodium 138  135 - 145 mEq/L   Potassium 3.2 (*) 3.5 - 5.1 mEq/L   Chloride 100  96 - 112 mEq/L   CO2 21  19 - 32 mEq/L   Glucose, Bld 147 (*) 70 - 99 mg/dL   BUN 19  6 - 23 mg/dL   Creatinine, Ser 4.09 (*) 0.50 - 1.35 mg/dL   Calcium 7.9 (*) 8.4 - 10.5 mg/dL   GFR calc non Af Amer >90  >90 mL/min   GFR calc Af Amer >90  >90 mL/min   Comment: (NOTE)     The eGFR has been calculated using the CKD EPI equation.     This calculation has not been validated in all clinical  situations.     eGFR's persistently <90 mL/min signify possible Chronic Kidney     Disease.  HEPATIC FUNCTION PANEL     Status: Abnormal   Collection Time    07/19/13 10:25 AM      Result Value Range   Total Protein 6.3  6.0 - 8.3 g/dL   Albumin 3.3 (*) 3.5 - 5.2 g/dL   AST 811 (*) 0 - 37 U/L   ALT 78 (*) 0 - 53 U/L   Alkaline Phosphatase 231 (*) 39 - 117 U/L   Total Bilirubin 1.3 (*) 0.3 - 1.2 mg/dL   Bilirubin, Direct 0.6 (*) 0.0 - 0.3 mg/dL   Indirect Bilirubin 0.7  0.3 - 0.9 mg/dL    US Abdomen Complete  07/19/2013   CLINICAL DATA:  Elevated liver function  studies. Nausea. History of chronic pancreatitis in known fatty liver. Cholecystectomy.  EXAM: ULTRASOUND ABDOMEN COMPLETE  COMPARISON:  CT abdomen and pelvis 11/03/2012  FINDINGS: Gallbladder  Gallbladder is surgically absent.  Common bile duct  Diameter: 13.4 mm, likely normal for postoperative physiology. Stable since previous CT scan.  Liver  Diffusely increased hepatic echotexture suggesting diffuse fatty infiltration. No focal lesions are identified.  IVC  Not visualized due to overlying bowel gas.  Pancreas  Not visualized due to overlying bowel gas.  Spleen  Size and appearance within normal limits.  Right Kidney  Length: 12.4 cm. Echogenicity within normal limits. No mass or hydronephrosis visualized.  Left Kidney  Length: 11.2 cm. Echogenicity within normal limits. No mass or hydronephrosis visualized.  Abdominal aorta  Limited visualization. Visualized portions demonstrate atherosclerotic change but no evidence of aneurysm.  IMPRESSION: Surgical absence of the gallbladder. Diffuse fatty infiltration of the liver. No specific acute abnormalities demonstrated.   Electronically Signed   By: Burman Nieves M.D.   On: 07/19/2013 01:20   Dg Chest Portable 1 View  07/18/2013   CLINICAL DATA:  Shortness of breath, left-sided chest pain  EXAM: PORTABLE CHEST - 1 VIEW  COMPARISON:  December 11, 2012  FINDINGS: The heart size and  mediastinal contours are within normal limits. Both lungs are clear. The visualized skeletal structures are unremarkable.  IMPRESSION: No active cardiopulmonary disease.   Electronically Signed   By: Sherian Rein M.D.   On: 07/18/2013 17:56    Review of Systems  Constitutional: Positive for diaphoresis. Negative for fever.  HENT: Positive for sore throat. Negative for congestion.   Respiratory: Positive for shortness of breath. Negative for cough.   Cardiovascular: Positive for chest pain. Negative for orthopnea, leg swelling and PND.  Gastrointestinal: Positive for nausea, vomiting, abdominal pain and blood in stool.  Genitourinary: Negative for hematuria.  Musculoskeletal: Positive for myalgias (Left arm).  Neurological: Negative for dizziness.   Blood pressure 140/88, pulse 121, temperature 98 F (36.7 C), temperature source Oral, resp. rate 22, height 5\' 8"  (1.727 m), weight 100 lb 12 oz (45.7 kg), SpO2 100.00%. Physical Exam  Constitutional: He is oriented to person, place, and time.  Weak, thin and malnourishes.   HENT:  Head: Normocephalic and atraumatic.  Eyes: EOM are normal. Pupils are equal, round, and reactive to light. No scleral icterus.  Neck: Normal range of motion. Neck supple. No JVD present.  Cardiovascular: Regular rhythm, S1 normal and S2 normal.  Tachycardia present.   No murmur heard. Pulses:      Radial pulses are 2+ on the right side, and 2+ on the left side.       Posterior tibial pulses are 1+ on the right side, and 1+ on the left side.  Respiratory: He has no wheezes. He has no rales.  Decreased BS bilaterally.  No rales   GI: Soft. There is tenderness.  Tender in the left UQ.  Palpable liver. +BS   Musculoskeletal: He exhibits no edema.  Neurological: He is alert and oriented to person, place, and time. He exhibits normal muscle tone.  Skin: Skin is warm and dry.  Psychiatric: He has a normal mood and affect.    Assessment/Plan: Principal  Problem:   DKA, type 2 Active Problems:   PVD RCIA PTA 11/12, LCIA PTA  Jan 2013 - patent left CIA stent, right CIA stenosis via PV angio 06/01/13   Hypertension   Pancreatic pseudocyst   DM (diabetes mellitus), type 2, uncontrolled  with complications   Chronic alcohol dependence, continuous   Hypokalemia   Metabolic acidosis   Diarrhea   Intravascular volume depletion  Plan:  The pt's last PV stent placement was 08/28/11 however, due to significant progression of disease at the origin of his right common iliac artery, he needs orbital for additional atherectomy to debulk prior to stenting.  I would hold plavix at this time since he has no recent stents.  MD opinion to follow.   Maintaining Sinus tach probably from low volume.    HAGER, BRYAN 07/19/2013, 11:49 AM   I have seen and evaluated the patient this PM along with Wilburt Finlay, PA. I agree with his findings, examination as well as impression recommendations.  62 y/o man with known PAD - on Plavix, h/o EtoH presenting with UGIB --> we are asked to re: recommendations for antiplatelet coverage in setting of UGIBleed.  I agree with Mr. Leron Croak, > 1 yr since PV stent.  Would be fine stopping Plavix for at least ~2 weeks or longer to allow for healing. S Tachycardia - ? Related to CIWA vs. Hypovolemia.  Once volume resuscitated, would consider using short acting BB (metoprolol) for rate control, especially if CIWA considerations are valid.  We will be available to assist if needed & will help arrange f/u with Dr. Allyson Sabal on d/c.  Marykay Lex, MD    Marykay Lex, M.D., M.S. [facility] [address] [place], [place]  [address]  [phone] Pager # [phone] 07/19/2013 4:06 PM

## 2013-07-19 NOTE — Progress Notes (Signed)
Utilization review completed.  

## 2013-07-20 DIAGNOSIS — K861 Other chronic pancreatitis: Secondary | ICD-10-CM

## 2013-07-20 LAB — BASIC METABOLIC PANEL
BUN: 7 mg/dL (ref 6–23)
Calcium: 8.2 mg/dL — ABNORMAL LOW (ref 8.4–10.5)
Calcium: 7.7 mg/dL — ABNORMAL LOW (ref 8.4–10.5)
Creatinine, Ser: 0.24 mg/dL — ABNORMAL LOW (ref 0.50–1.35)
GFR calc Af Amer: >90 mL/min (ref >90)
GFR calc Af Amer: >90 mL/min (ref >90)
GFR calc non Af Amer: >90 mL/min (ref >90)
GFR calc non Af Amer: >90 mL/min (ref >90)
Glucose, Bld: 27 mg/dL — CL (ref 70–99)
Potassium: 2.7 mEq/L — CL (ref 3.5–5.1)
Potassium: 3.2 mEq/L — ABNORMAL LOW (ref 3.5–5.1)
Sodium: 134 mEq/L — ABNORMAL LOW (ref 135–145)
Sodium: 131 mEq/L — ABNORMAL LOW (ref 135–145)

## 2013-07-20 LAB — CBC
Hemoglobin: 10.7 g/dL — ABNORMAL LOW (ref 13.0–17.0)
MCH: 36 pg — ABNORMAL HIGH (ref 26.0–34.0)
MCHC: 36.5 g/dL — ABNORMAL HIGH (ref 30.0–36.0)
Platelets: 136 10*3/uL — ABNORMAL LOW (ref 150–400)
RBC: 2.97 MIL/uL — ABNORMAL LOW (ref 4.22–5.81)

## 2013-07-20 LAB — POTASSIUM: Potassium: 3.1 mEq/L — ABNORMAL LOW (ref 3.5–5.1)

## 2013-07-20 LAB — GLUCOSE, CAPILLARY
Glucose-Capillary: 116 mg/dL — ABNORMAL HIGH (ref 70–99)
Glucose-Capillary: 304 mg/dL — ABNORMAL HIGH (ref 70–99)
Glucose-Capillary: 311 mg/dL — ABNORMAL HIGH (ref 70–99)
Glucose-Capillary: 274 mg/dL — ABNORMAL HIGH (ref 70–99)
Glucose-Capillary: 286 mg/dL — ABNORMAL HIGH (ref 70–99)
Glucose-Capillary: 199 mg/dL — ABNORMAL HIGH (ref 70–99)
Glucose-Capillary: 113 mg/dL — ABNORMAL HIGH (ref 70–99)
Glucose-Capillary: 234 mg/dL — ABNORMAL HIGH (ref 70–99)

## 2013-07-20 MED ORDER — NICOTINE 14 MG/24HR TD PT24
14.0000 mg | MEDICATED_PATCH | Freq: Every day | TRANSDERMAL | Status: DC
  Administered 2013-07-20 – 2013-07-25 (×6): 14 mg via TRANSDERMAL
  Filled 2013-07-20 (×6): qty 1

## 2013-07-20 MED ORDER — SODIUM CHLORIDE 0.9 % IV SOLN: INTRAVENOUS | Status: DC

## 2013-07-20 MED ORDER — LORAZEPAM 2 MG/ML IJ SOLN: 1.0000 mg | INTRAMUSCULAR | Status: DC | PRN

## 2013-07-20 MED ORDER — POTASSIUM CHLORIDE 10 MEQ/100ML IV SOLN
10.0000 meq | INTRAVENOUS | Status: AC
  Administered 2013-07-20 (×3): 10 meq via INTRAVENOUS
  Filled 2013-07-20 (×3): qty 100

## 2013-07-20 MED ORDER — LORAZEPAM 2 MG/ML IJ SOLN: 2.0000 mg | INTRAMUSCULAR | Status: DC | PRN

## 2013-07-20 MED ORDER — POTASSIUM CHLORIDE CRYS ER 20 MEQ PO TBCR
40.0000 meq | EXTENDED_RELEASE_TABLET | Freq: Two times a day (BID) | ORAL | Status: AC
  Administered 2013-07-20 (×2): 40 meq via ORAL
  Filled 2013-07-20 (×3): qty 2

## 2013-07-20 MED ORDER — DEXTROSE-NACL 5-0.9 % IV SOLN
INTRAVENOUS | Status: DC
  Administered 2013-07-20: 07:00:00 via INTRAVENOUS

## 2013-07-20 MED ORDER — INSULIN DETEMIR 100 UNIT/ML ~~LOC~~ SOLN
5.0000 [IU] | Freq: Every day | SUBCUTANEOUS | Status: DC
  Filled 2013-07-20: qty 0.05

## 2013-07-20 MED ORDER — POTASSIUM CHLORIDE CRYS ER 20 MEQ PO TBCR
40.0000 meq | EXTENDED_RELEASE_TABLET | Freq: Once | ORAL | Status: AC
  Administered 2013-07-20: 40 meq via ORAL
  Filled 2013-07-20: qty 2

## 2013-07-20 MED ORDER — INSULIN DETEMIR 100 UNIT/ML ~~LOC~~ SOLN
5.0000 [IU] | Freq: Every day | SUBCUTANEOUS | Status: DC
  Administered 2013-07-20 – 2013-07-23 (×4): 5 [IU] via SUBCUTANEOUS
  Filled 2013-07-20 (×5): qty 0.05

## 2013-07-20 MED ORDER — DEXTROSE 50 % IV SOLN
INTRAVENOUS | Status: AC
  Administered 2013-07-20: 50 mL via INTRAVENOUS
  Filled 2013-07-20: qty 50

## 2013-07-20 MED ORDER — LORAZEPAM 2 MG/ML IJ SOLN
1.0000 mg | INTRAMUSCULAR | Status: DC | PRN
  Administered 2013-07-20 – 2013-07-21 (×4): 1 mg via INTRAVENOUS
  Filled 2013-07-20 (×4): qty 1

## 2013-07-20 MED ORDER — INSULIN DETEMIR 100 UNIT/ML ~~LOC~~ SOLN
10.0000 [IU] | Freq: Every day | SUBCUTANEOUS | Status: DC
  Filled 2013-07-20: qty 0.1

## 2013-07-20 MED ORDER — SODIUM CHLORIDE 0.9 % IV SOLN
INTRAVENOUS | Status: DC
  Administered 2013-07-20 – 2013-07-22 (×3): via INTRAVENOUS

## 2013-07-20 NOTE — Progress Notes (Signed)
[facility] PROGRESS NOTE Subjective No further bleeding, BS dec insulin on hold  Objective: Vital signs in last 24 hours: Temp:  [97.4 F (36.3 C)-98.5 F (36.9 C)] 97.4 F (36.3 C) (11/27 0800) Pulse Rate:  [104-118] 109 (11/27 0400) Resp:  [10-30] 21 (11/27 0400) BP: (109-131)/(50-77) 109/50 mmHg (11/27 0400) SpO2:  [100 %] 100 % (11/27 0400) Last BM Date: 07/19/13  Intake/Output from previous day: 11/26 0701 - 11/27 0700 In: 320 [P.O.:120; IV Piggyback:200] Out: 4400 [Urine:4400] Intake/Output this shift:    PE: General--alert no distress Heart-- Lungs--clear Abdomen--soft nontender  Lab Results:  Recent Labs  07/18/13 1735 07/18/13 2121 07/19/13 0755 07/19/13 1624 07/20/13 0540  WBC 10.8* 10.8*  --   --  14.5*  HGB 13.8 11.7* 11.8* 10.6* 10.7*  HCT 39.1 34.1* 32.8* 29.6* 29.3*  PLT 203 179  --   --  136*   BMET  Recent Labs  07/19/13 1025 07/19/13 1306 07/19/13 1555 07/19/13 1830 07/20/13 0540  NA 138 138 131* 132* 134*  K 3.2* 2.7* 2.9* 2.9* 2.7*  CL 100 98 91* 96 96  CO2 21 24 20 25 26   CREATININE 0.34* 0.35* 0.29* 0.27* 0.24*   LFT  Recent Labs  07/18/13 1735 07/19/13 1025  PROT 8.0 6.3  AST 264* 106*  ALT 131* 78*  ALKPHOS 356* 231*  BILITOT 1.9* 1.3*  BILIDIR  --  0.6*  IBILI  --  0.7   PT/INR No results found for this basename: LABPROT, INR,  in the last 72 hours PANCREAS  Recent Labs  07/18/13 2121  LIPASE 10*         Studies/Results: US Abdomen Complete  07/19/2013   CLINICAL DATA:  Elevated liver function studies. Nausea. History of chronic pancreatitis in known fatty liver. Cholecystectomy.  EXAM: ULTRASOUND ABDOMEN COMPLETE  COMPARISON:  CT abdomen and pelvis 11/03/2012  FINDINGS: Gallbladder  Gallbladder is surgically absent.  Common bile duct  Diameter: 13.4 mm, likely normal for postoperative physiology. Stable since previous CT scan.  Liver  Diffusely increased hepatic echotexture suggesting  diffuse fatty infiltration. No focal lesions are identified.  IVC  Not visualized due to overlying bowel gas.  Pancreas  Not visualized due to overlying bowel gas.  Spleen  Size and appearance within normal limits.  Right Kidney  Length: 12.4 cm. Echogenicity within normal limits. No mass or hydronephrosis visualized.  Left Kidney  Length: 11.2 cm. Echogenicity within normal limits. No mass or hydronephrosis visualized.  Abdominal aorta  Limited visualization. Visualized portions demonstrate atherosclerotic change but no evidence of aneurysm.  IMPRESSION: Surgical absence of the gallbladder. Diffuse fatty infiltration of the liver. No specific acute abnormalities demonstrated.   Electronically Signed   By: Burman Nieves M.D.   On: 07/19/2013 01:20   Dg Chest Portable 1 View  07/18/2013   CLINICAL DATA:  Shortness of breath, left-sided chest pain  EXAM: PORTABLE CHEST - 1 VIEW  COMPARISON:  December 11, 2012  FINDINGS: The heart size and mediastinal contours are within normal limits. Both lungs are clear. The visualized skeletal structures are unremarkable.  IMPRESSION: No active cardiopulmonary disease.   Electronically Signed   By: Sherian Rein M.D.   On: 07/18/2013 17:56    Medications: I have reviewed the patient's current medications.  Assessment/Plan: 1. Hematemesis. Stable at present will plan EGD in Am pt aware.   Emmerson Taddei JR,Favor Hackler L 07/20/2013, 9:40 AM

## 2013-07-20 NOTE — ED Provider Notes (Signed)
Medical screening examination/treatment/procedure(s) were performed by non-physician practitioner and as supervising physician I was immediately available for consultation/collaboration.  EKG Interpretation    Date/Time:  Tuesday July 18 2013 17:03:00 EST Ventricular Rate:  124 PR Interval:  146 QRS Duration: 102 QT Interval:  334 QTC Calculation: 479 R Axis:   90 Text Interpretation:  Sinus tachycardia Possible Left atrial enlargement Rightward axis Borderline ECG ED PHYSICIAN INTERPRETATION AVAILABLE IN [facility] Confirmed by TEST, RECORD ([id number]), editor CLAYTON  CCT  CETT, ROBIN (2) on 07/20/2013 8:43:17 AM             Geoffery Lyons, MD 07/20/13 1544

## 2013-07-20 NOTE — Progress Notes (Addendum)
[facility] PROGRESS NOTE  Mitchell Mccall ZOX:[id number] DOB: [birth year] DOA: 07/18/2013 PCP:  Duane Lope, MD  Assessment/Plan: 1.Metabolic Acidosis; he presents with increase anion gap metabolic acidosis. Gap at 38. Multifactorial DKA, acidosis from GI losses.  -Resolved. Gap close.   2-Nausea, vomiting, coffee ground emesis: Continue with  IV protonix. GI consulted. Hold plavix for now. Endoscopy probably Friday.   3-Transaminases: Abdominal US: Surgical absence of the gallbladder. Diffuse fatty infiltration of the liver. No specific acute abnormalities demonstrated. GI consulted. Trending down. Repeat in am.   4-Diabetes, DKA: see # 1. Resolved.   5-Alcohol use, last drink Saturday. Continue CIWA protocol.   6-Chronic pancreatitis. Might explain some component of abdominal pain. Abdominal pain better.   7-Diarrhea: C diff pending. IV fluids.   8-DVT prophylaxis; SCD. Hold heparin due to GI bleed.   9-Hypoglycemia: D 50 amp times 2. D 5 IV fluids. Will decrease levemir to 5 units. cbg every 1 times 4. Patient alert after first amp of D 50 10-Hypokalemia: replete with 3 runs IV. 40 meq oral. Repeat B-met at 11 am.   Code Status: Patient wishes to be a Full Code.  Family Communication: care discussed with patient.  Disposition Plan: remain in the step down.    Consultants:  GI.   Procedures:  none  Antibiotics:  none  HPI/Subjective: Alert, answering questions. Denies abdominal pain. No vomiting overnight.   Objective: Filed Vitals:   07/20/13 0400  BP: 109/50  Pulse: 109  Temp: 98.1 F (36.7 C)  Resp: 21    Intake/Output Summary (Last 24 hours) at 07/20/13 0727 Last data filed at 07/20/13 0400  Gross per 24 hour  Intake    320 ml  Output   4400 ml  Net  -4080 ml   Filed Weights   07/18/13 2105  Weight: 45.7 kg (100 lb 12 oz)    Exam:   General:  No distress.   Cardiovascular: S 1, S 2 RRR  Respiratory: CTA  Abdomen: Bs present, NT,  NR  Musculoskeletal: no edema  Data Reviewed: Basic Metabolic Panel:  Recent Labs Lab 07/19/13 1025 07/19/13 1306 07/19/13 1555 07/19/13 1830 07/20/13 0540  NA 138 138 131* 132* 134*  K 3.2* 2.7* 2.9* 2.9* 2.7*  CL 100 98 91* 96 96  CO2 21 24 20 25 26   GLUCOSE 147* 137* 265* 115* 27*  BUN 19 19 14 12 8   CREATININE 0.34* 0.35* 0.29* 0.27* 0.24*  CALCIUM 7.9* 8.1* 8.0* 7.8* 8.2*   Liver Function Tests:  Recent Labs Lab 07/18/13 1735 07/19/13 1025  AST 264* 106*  ALT 131* 78*  ALKPHOS 356* 231*  BILITOT 1.9* 1.3*  PROT 8.0 6.3  ALBUMIN 4.1 3.3*    Recent Labs Lab 07/18/13 2121  LIPASE 10*   No results found for this basename: AMMONIA,  in the last 168 hours CBC:  Recent Labs Lab 07/18/13 1735 07/18/13 2121 07/19/13 0755 07/19/13 1624 07/20/13 0540  WBC 10.8* 10.8*  --   --  14.5*  NEUTROABS 9.1*  --   --   --   --   HGB 13.8 11.7* 11.8* 10.6* 10.7*  HCT 39.1 34.1* 32.8* 29.6* 29.3*  MCV 104.0* 104.3*  --   --  98.7  PLT 203 179  --   --  136*   Cardiac Enzymes:  Recent Labs Lab 07/18/13 2121 07/19/13 0409 07/19/13 1025  TROPONINI <0.30 <0.30 <0.30   BNP (last 3 results) No results found for this basename: PROBNP,  in the last 8760 hours CBG:  Recent Labs Lab 07/19/13 1415 07/19/13 1536 07/19/13 1650 07/19/13 1758 07/19/13 1904  GLUCAP 181* 226* 275* 204* 109*    Recent Results (from the past 240 hour(s))  MRSA PCR SCREENING     Status: None   Collection Time    07/18/13  9:21 PM      Result Value Range Status   MRSA by PCR NEGATIVE  NEGATIVE Final   Comment:            The GeneXpert MRSA Assay (FDA     approved for NASAL specimens     only), is one component of a     comprehensive MRSA colonization     surveillance program. It is not     intended to diagnose MRSA     infection nor to guide or     monitor treatment for     MRSA infections.     Studies: US Abdomen Complete  07/19/2013   CLINICAL DATA:  Elevated liver  function studies. Nausea. History of chronic pancreatitis in known fatty liver. Cholecystectomy.  EXAM: ULTRASOUND ABDOMEN COMPLETE  COMPARISON:  CT abdomen and pelvis 11/03/2012  FINDINGS: Gallbladder  Gallbladder is surgically absent.  Common bile duct  Diameter: 13.4 mm, likely normal for postoperative physiology. Stable since previous CT scan.  Liver  Diffusely increased hepatic echotexture suggesting diffuse fatty infiltration. No focal lesions are identified.  IVC  Not visualized due to overlying bowel gas.  Pancreas  Not visualized due to overlying bowel gas.  Spleen  Size and appearance within normal limits.  Right Kidney  Length: 12.4 cm. Echogenicity within normal limits. No mass or hydronephrosis visualized.  Left Kidney  Length: 11.2 cm. Echogenicity within normal limits. No mass or hydronephrosis visualized.  Abdominal aorta  Limited visualization. Visualized portions demonstrate atherosclerotic change but no evidence of aneurysm.  IMPRESSION: Surgical absence of the gallbladder. Diffuse fatty infiltration of the liver. No specific acute abnormalities demonstrated.   Electronically Signed   By: Burman Nieves M.D.   On: 07/19/2013 01:20   Dg Chest Portable 1 View  07/18/2013   CLINICAL DATA:  Shortness of breath, left-sided chest pain  EXAM: PORTABLE CHEST - 1 VIEW  COMPARISON:  December 11, 2012  FINDINGS: The heart size and mediastinal contours are within normal limits. Both lungs are clear. The visualized skeletal structures are unremarkable.  IMPRESSION: No active cardiopulmonary disease.   Electronically Signed   By: Sherian Rein M.D.   On: 07/18/2013 17:56    Scheduled Meds: . DULoxetine  30 mg Oral BID  . folic acid  1 mg Intravenous Daily  . gabapentin  300 mg Oral TID  . insulin aspart  0-9 Units Subcutaneous TID WC  . insulin detemir  5 Units Subcutaneous QHS  . multivitamin with minerals  1 tablet Oral Daily  . pantoprazole (PROTONIX) IV  40 mg Intravenous Q12H  . potassium  chloride  10 mEq Intravenous Q1 Hr x 3  . potassium chloride  40 mEq Oral Once  . sodium chloride  10-40 mL Intracatheter Q12H  . sodium chloride  3 mL Intravenous Q12H  . thiamine  100 mg Intravenous Daily   Continuous Infusions: . 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     Lariyah Shetterly  [facility] Pager [phone]. If 7PM-7AM, please contact night-coverage at [email or web address], password [facility] 07/20/2013, 7:27 AM  LOS: 2 days

## 2013-07-20 NOTE — Progress Notes (Signed)
Room [facility] 3S 01 - Mitchell Mccall - [facility] RN Visit-R.Jerel Sardina RN  Related admission to [facility] diagnosis of chronic pancreatitis.  Pt is FULL code (pt was DNR at home - long discussion w/education with pt and his wife present  Re: DNR vs full code status).    Pt alert & oriented, lying in bed, with complaints of chest pain. Patient's home medication list is on shadow chart.   Discussion with staff RN Herbert Seta - pt has fragile CBG's ranging  from 19-311, increasing confusion, tachycardia, staff cautious that pt is going into DT's.  Being observed very closely by staff RN.   Please call [facility] @ [phone]- ask for RN Liaison or after hours,ask for on-call RN with any hospice needs.   Thank you.  Joneen Boers, RN  [facility]  Hospice Liaison  [phone])

## 2013-07-21 ENCOUNTER — Encounter (HOSPITAL_COMMUNITY)
Admission: EM | Disposition: A | Discharge status: Skilled Nursing Facility | Payer: Self-pay | Source: Home / Self Care | Attending: Internal Medicine

## 2013-07-21 ENCOUNTER — Encounter (HOSPITAL_COMMUNITY): Payer: Self-pay

## 2013-07-21 ENCOUNTER — Inpatient Hospital Stay (HOSPITAL_COMMUNITY)

## 2013-07-21 HISTORY — PX: ESOPHAGOGASTRODUODENOSCOPY: SHX5428

## 2013-07-21 LAB — GLUCOSE, CAPILLARY
Glucose-Capillary: 70 mg/dL (ref 70–99)
Glucose-Capillary: 187 mg/dL — ABNORMAL HIGH (ref 70–99)
Glucose-Capillary: 146 mg/dL — ABNORMAL HIGH (ref 70–99)
Glucose-Capillary: 285 mg/dL — ABNORMAL HIGH (ref 70–99)
Glucose-Capillary: 116 mg/dL — ABNORMAL HIGH (ref 70–99)

## 2013-07-21 LAB — CBC
HCT: 23.9 % — ABNORMAL LOW (ref 39.0–52.0)
Hemoglobin: 8.7 g/dL — ABNORMAL LOW (ref 13.0–17.0)
MCH: 35.7 pg — ABNORMAL HIGH (ref 26.0–34.0)
MCV: 98 fL (ref 78.0–100.0)
Platelets: 85 10*3/uL — ABNORMAL LOW (ref 150–400)
RBC: 2.44 MIL/uL — ABNORMAL LOW (ref 4.22–5.81)
RDW: 13.6 % (ref 11.5–15.5)
WBC: 6.5 10*3/uL (ref 4.0–10.5)

## 2013-07-21 LAB — COMPREHENSIVE METABOLIC PANEL
BUN: 3 mg/dL — ABNORMAL LOW (ref 6–23)
CO2: 30 mEq/L (ref 19–32)
Calcium: 7.6 mg/dL — ABNORMAL LOW (ref 8.4–10.5)
Chloride: 96 mEq/L (ref 96–112)
Creatinine, Ser: 0.25 mg/dL — ABNORMAL LOW (ref 0.50–1.35)
GFR calc Af Amer: >90 mL/min (ref >90)
GFR calc non Af Amer: >90 mL/min (ref >90)
Glucose, Bld: 99 mg/dL (ref 70–99)
Total Bilirubin: 0.6 mg/dL (ref 0.3–1.2)

## 2013-07-21 SURGERY — EGD (ESOPHAGOGASTRODUODENOSCOPY)
Anesthesia: Moderate Sedation

## 2013-07-21 MED ORDER — MIDAZOLAM HCL 10 MG/2ML IJ SOLN
INTRAMUSCULAR | Status: DC | PRN
  Administered 2013-07-21: 1.5 mg via INTRAVENOUS
  Administered 2013-07-21: 1 mg via INTRAVENOUS
  Administered 2013-07-21: 1.5 mg via INTRAVENOUS

## 2013-07-21 MED ORDER — BUTAMBEN-TETRACAINE-BENZOCAINE 2-2-14 % EX AERO
INHALATION_SPRAY | CUTANEOUS | Status: DC | PRN
  Administered 2013-07-21: 2 via TOPICAL

## 2013-07-21 MED ORDER — MIDAZOLAM HCL 5 MG/ML IJ SOLN
INTRAMUSCULAR | Status: AC
  Filled 2013-07-21: qty 2

## 2013-07-21 MED ORDER — POTASSIUM CHLORIDE 10 MEQ/100ML IV SOLN
10.0000 meq | INTRAVENOUS | Status: AC
  Administered 2013-07-21 (×3): 10 meq via INTRAVENOUS
  Filled 2013-07-21: qty 100

## 2013-07-21 MED ORDER — SUCRALFATE 1 GM/10ML PO SUSP
1.0000 g | Freq: Three times a day (TID) | ORAL | Status: DC
  Administered 2013-07-21 – 2013-07-25 (×17): 1 g via ORAL
  Filled 2013-07-21 (×23): qty 10

## 2013-07-21 MED ORDER — FENTANYL CITRATE 0.05 MG/ML IJ SOLN
INTRAMUSCULAR | Status: AC
  Filled 2013-07-21: qty 2

## 2013-07-21 MED ORDER — POTASSIUM CHLORIDE 10 MEQ/100ML IV SOLN: 10.0000 meq | INTRAVENOUS | Status: AC

## 2013-07-21 MED ORDER — FENTANYL CITRATE 0.05 MG/ML IJ SOLN
INTRAMUSCULAR | Status: DC | PRN
  Administered 2013-07-21: 25 ug via INTRAVENOUS

## 2013-07-21 NOTE — Progress Notes (Signed)
Room 3S 01 - Mitchell Mccall - [facility] RN Visit-[name] RN  NON-COVERED, Related admission to [facility] diagnosis of chronic pancreatitis.  Pt is FULL code.    Pt alert & oriented, sitting up in bed, eating lunch following EGD today.  Pt states he is feeling better, continues with spasms of the stomach.  No family present. Patient's home medication list is on shadow chart.   Please call [facility] @ [phone]- ask for RN Liaison or after hours,ask for on-call RN with any hospice needs.   Thank you.  Joneen Boers, RN  [facility]  Hospice Liaison  [phone])

## 2013-07-21 NOTE — H&P (View-Only) (Signed)
[facility] CONS[facility] Reason for consult: hematemesis Referring Physician: [facility]. PCP: Dr. Duane Lope  Mitchell Mccall is an 62 y.o. male.  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 Unfortunately, he still drinks on a regular basis and admits to drinking 6 to 8 beers at least 4 to 5 times a week. He has chronic dyspepsia and apparently takes Tums on a regular basis at home. He denies taking any other medications such as H2 blockers or PPI is at this time. I'm not certain of the extent of his liver disease. He is known to have chronic pancreatitis with pseudocyst documented by CT scan 3/14. His previously had EUS with FNA pancreatic cyst that was felt to be due to chronic alcoholic pancreatitis. CT scan earlier this year showed is stable lesion in the liver. The patient does have a history of prior cholecystectomy. His liver tests have often been elevated. He was admitted in the ER with abdominal pain, coffee ground emesis. Has history of ulcers, depression, colon polyps, previous EGD's and colonoscopies, DTs, and peripheral vascular disease. Since admission, his hemoglobin has remained 11.8. This platelet count is normal in his transaminases or elevated the total bilirubin is near normal. To the patient's history of coronary disease and vascular disease he has been on antiplatelet agents. He is being evaluated by cardiology. Were asked to see him because of his history of hematemesis. The patient denies the use of NSAIDs and denies abdominal pain  Past Medical History  Diagnosis Date  . Peripheral vascular disease Jan 2013, Nov 2012    LCIA stent , RCIA stent  . GERD (gastroesophageal reflux disease)   . Stomach ulcer   . Hypertension   . Anxiety   . Depression   . H/O hiatal hernia   . Anemia   . Undescended testicle   . Colon polyps   . Chronic lower back pain   . Subdural hematoma JULY 2006  . Duodenitis   .  Esophagitis   . Coarse tremors   . Diverticulosis   . Rib fracture DEC 2005    Right sided from a fall  . Rectal bleed JULY 2004  . Vitamin D deficiency   . Macrocytosis   . DTs (delirium tremens)   . Claudication in peripheral vascular disease   . Pancreatitis, alcoholic   . Fatty liver, alcoholic   . Hemorrhoids   . 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 Pancreatic pseudocyst   . Asthma   . Pneumonia DEC 2003  . Type II diabetes mellitus   . Blood dyscrasia     macrocytosis  . ZOXWRUEA(540.9)     "probably monthly" (06/01/2013)  . Seizures     "loses contact w/the world for a minute then comes back; probably q 6 months he'll have a couple of them" (06/01/2013)  . 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 Angioplasty / stenting iliac  Jan 2013, Nov 2012    LCIA stent, RCIA stent  . Ligament repair Left 1955    eg   . False aneurysm repair  09/16/2011    Procedure: REPAIR FALSE ANEURYSM;  Surgeon: Larina Earthly, MD;  Location: [facility] OR;  Service: Vascular;  Laterality: Left;  . Incision and drainage of wound      groin  . Eus  01/20/2012  Procedure: ESOPHAGEAL ENDOSCOPIC ULTRASOUND (EUS) RADIAL;  Surgeon: Willis Modena, MD;  Location: [facility] ENDOSCOPY;  Service: Endoscopy;  Laterality: N/A;  . Fine needle aspiration  01/20/2012    Procedure: FINE NEEDLE ASPIRATION (FNA) LINEAR;  Surgeon: Willis Modena, MD;  Location: [facility] ENDOSCOPY;  Service: Endoscopy;  Laterality: N/A;  . Cholecystectomy  05/06/2012    Procedure: LAPAROSCOPIC CHOLECYSTECTOMY WITH INTRAOPERATIVE CHOLANGIOGRAM;  Surgeon: Atilano Ina, MD,FACS;  Location: [facility] OR;  Service: General;  Laterality: N/A;  laparoscopic cholecystectomy with intraoperative cholangiogram  . Ligament repair Right 1990's    "had an extra ligament in & they cut it loose" (06/01/2013)    Family History  Problem Relation Age of Onset   . Diabetes Father   . Coronary artery disease Mother     Social History:  reports that he has been smoking Cigarettes.  He has a 50 pack-year smoking history. He has never used smokeless tobacco. He reports that he drinks about 12.6 ounces of alcohol per week. He reports that he does not use illicit drugs.  Allergies: No Known Allergies  Medications; Prior to Admission medications   Medication Sig Start Date End Date Taking? Authorizing Provider  aspirin EC 81 MG tablet Take 81 mg by mouth daily.   Yes Historical Provider, MD  chlorproMAZINE (THORAZINE) 25 MG tablet Take 25-50 mg by mouth 4 (four) times daily as needed (spasms).  05/16/13  Yes Historical Provider, MD  clopidogrel (PLAVIX) 75 MG tablet Take 1 tablet (75 mg total) by mouth daily. 02/27/13  Yes Runell Gess, MD  doxepin (SINEQUAN) 25 MG capsule Take 25 mg by mouth at bedtime as needed (for itching and sleep).  06/12/13  Yes Historical Provider, MD  DULoxetine (CYMBALTA) 30 MG capsule Take 30 mg by mouth 2 (two) times daily.   Yes Historical Provider, MD  gabapentin (NEURONTIN) 300 MG capsule Take 300 mg by mouth 3 (three) times daily.    Yes Historical Provider, MD  insulin detemir (LEVEMIR) 100 UNIT/ML injection Inject 20 Units into the skin every morning.    Yes Historical Provider, MD  metoCLOPramide (REGLAN) 10 MG tablet Take 10 mg by mouth 4 (four) times daily as needed for nausea.   Yes Historical Provider, MD  Multiple Vitamins-Minerals (MULTIVITAMINS THER. W/MINERALS) TABS Take 1 tablet by mouth daily.    Yes Historical Provider, MD  nitroGLYCERIN (NITROSTAT) 0.4 MG SL tablet Place 0.4 mg under the tongue every 5 (five) minutes x 3 doses as needed for chest pain.  08/29/11  Yes Luke K Kilroy, PA-C  NOVOLOG FLEXPEN 100 UNIT/ML SOPN FlexPen Inject 1-12 Units into the skin See admin instructions. On a Sliding Scale 02/16/13  Yes Historical Provider, MD  potassium chloride SA (K-DUR,KLOR-CON) 20 MEQ tablet Take 20 mEq by mouth  2 (two) times daily.   Yes Historical Provider, MD  thiamine (VITAMIN B-1) 100 MG tablet Take 100 mg by mouth daily.   Yes Historical Provider, MD  traMADol (ULTRAM) 50 MG tablet Take 50 mg by mouth every 6 (six) hours as needed for moderate pain.  09/11/12  Yes Historical Provider, MD   . DULoxetine  30 mg Oral BID  . folic acid  1 mg Intravenous Daily  . gabapentin  300 mg Oral TID  . multivitamin with minerals  1 tablet Oral Daily  . pantoprazole (PROTONIX) IV  40 mg Intravenous Q12H  . potassium chloride  10 mEq Intravenous Q1 Hr x 4  . sodium chloride  10-40 mL Intracatheter Q12H  .  sodium chloride  3 mL Intravenous Q12H  . thiamine  100 mg Intravenous Daily   PRN Meds dextrose, doxepin, LORazepam, LORazepam, ondansetron (ZOFRAN) IV, ondansetron, promethazine, sodium chloride Results for orders placed during the hospital encounter of 07/18/13 (from the past 48 hour(s))  CBC WITH DIFFERENTIAL     Status: Abnormal   Collection Time    07/18/13  5:35 PM      Result Value Range   WBC 10.8 (*) 4.0 - 10.5 K/uL   RBC 3.76 (*) 4.22 - 5.81 MIL/uL   Hemoglobin 13.8  13.0 - 17.0 g/dL   HCT 16.1  09.6 - 04.5 %   MCV 104.0 (*) 78.0 - 100.0 fL   MCH 36.7 (*) 26.0 - 34.0 pg   MCHC 35.3  30.0 - 36.0 g/dL   RDW 40.9  81.1 - 91.4 %   Platelets 203  150 - 400 K/uL   Neutrophils Relative % 83 (*) 43 - 77 %   Neutro Abs 9.1 (*) 1.7 - 7.7 K/uL   Lymphocytes Relative 6 (*) 12 - 46 %   Lymphs Abs 0.7  0.7 - 4.0 K/uL   Monocytes Relative 10  3 - 12 %   Monocytes Absolute 1.1 (*) 0.1 - 1.0 K/uL   Eosinophils Relative 0  0 - 5 %   Eosinophils Absolute 0.0  0.0 - 0.7 K/uL   Basophils Relative 0  0 - 1 %   Basophils Absolute 0.0  0.0 - 0.1 K/uL  COMPREHENSIVE METABOLIC PANEL     Status: Abnormal   Collection Time    07/18/13  5:35 PM      Result Value Range   Sodium 134 (*) 135 - 145 mEq/L   Potassium 2.9 (*) 3.5 - 5.1 mEq/L   Chloride 78 (*) 96 - 112 mEq/L   CO2 9 (*) 19 - 32 mEq/L   Comment:  CRITICAL RESULT CALLED TO, READ BACK BY AND VERIFIED WITH:     RN CHAMBER, B. 1834 07/18/13 Sallyanne Kuster M.   Glucose, Bld 278 (*) 70 - 99 mg/dL   BUN 17  6 - 23 mg/dL   Creatinine, Ser 7.82  0.50 - 1.35 mg/dL   Calcium 9.0  8.4 - 95.6 mg/dL   Total Protein 8.0  6.0 - 8.3 g/dL   Albumin 4.1  3.5 - 5.2 g/dL   AST 213 (*) 0 - 37 U/L   ALT 131 (*) 0 - 53 U/L   Alkaline Phosphatase 356 (*) 39 - 117 U/L   Total Bilirubin 1.9 (*) 0.3 - 1.2 mg/dL   GFR calc non Af Amer >90  >90 mL/min   GFR calc Af Amer >90  >90 mL/min   Comment: (NOTE)     The eGFR has been calculated using the CKD EPI equation.     This calculation has not been validated in all clinical situations.     eGFR's persistently <90 mL/min signify possible Chronic Kidney     Disease.  POCT I-STAT TROPONIN I     Status: None   Collection Time    07/18/13  5:55 PM      Result Value Range   Troponin i, poc 0.01  0.00 - 0.08 ng/mL   Comment 3            Comment: Due to the release kinetics of cTnI,     a negative result within the first hours     of the onset of symptoms does not rule out  myocardial infarction with certainty.     If myocardial infarction is still suspected,     repeat the test at appropriate intervals.  CG4 I-STAT (LACTIC ACID)     Status: Abnormal   Collection Time    07/18/13  5:56 PM      Result Value Range   Lactic Acid, Venous 2.96 (*) 0.5 - 2.2 mmol/L  GLUCOSE, CAPILLARY     Status: Abnormal   Collection Time    07/18/13  5:58 PM      Result Value Range   Glucose-Capillary 272 (*) 70 - 99 mg/dL  ETHANOL     Status: None   Collection Time    07/18/13  7:16 PM      Result Value Range   Alcohol, Ethyl (B) <11  0 - 11 mg/dL   Comment:            LOWEST DETECTABLE LIMIT FOR     SERUM ALCOHOL IS 11 mg/dL     FOR MEDICAL PURPOSES ONLY  POCT I-STAT 3, BLOOD GAS (G3+)     Status: Abnormal   Collection Time    07/18/13  7:39 PM      Result Value Range   pH, Arterial 7.176 (*) 7.350 - 7.450   pCO2  arterial 15.4 (*) 35.0 - 45.0 mmHg   pO2, Arterial 128.0 (*) 80.0 - 100.0 mmHg   Bicarbonate 5.7 (*) 20.0 - 24.0 mEq/L   TCO2 6  0 - 100 mmol/L   O2 Saturation 98.0     Acid-base deficit 20.0 (*) 0.0 - 2.0 mmol/L   Patient temperature 99.4 F     Collection site RADIAL, ALLEN'S TEST ACCEPTABLE     Drawn by Operator     Sample type ARTERIAL     Comment NOTIFIED PHYSICIAN    URINALYSIS W MICROSCOPIC + REFLEX CULTURE     Status: Abnormal   Collection Time    07/18/13  8:05 PM      Result Value Range   Color, Urine YELLOW  YELLOW   APPearance CLEAR  CLEAR   Specific Gravity, Urine 1.015  1.005 - 1.030   pH 5.5  5.0 - 8.0   Glucose, UA NEGATIVE  NEGATIVE mg/dL   Hgb urine dipstick NEGATIVE  NEGATIVE   Bilirubin Urine NEGATIVE  NEGATIVE   Ketones, ur >80 (*) NEGATIVE mg/dL   Protein, ur 30 (*) NEGATIVE mg/dL   Urobilinogen, UA 0.2  0.0 - 1.0 mg/dL   Nitrite NEGATIVE  NEGATIVE   Leukocytes, UA NEGATIVE  NEGATIVE   WBC, UA 0-2  <3 WBC/hpf   Casts HYALINE CASTS (*) NEGATIVE  GLUCOSE, CAPILLARY     Status: Abnormal   Collection Time    07/18/13  8:07 PM      Result Value Range   Glucose-Capillary 255 (*) 70 - 99 mg/dL  GLUCOSE, CAPILLARY     Status: Abnormal   Collection Time    07/18/13  9:10 PM      Result Value Range   Glucose-Capillary 299 (*) 70 - 99 mg/dL   Comment 1 Notify RN     Comment 2 Documented in Chart    MRSA PCR SCREENING     Status: None   Collection Time    07/18/13  9:21 PM      Result Value Range   MRSA by PCR NEGATIVE  NEGATIVE   Comment:            The GeneXpert MRSA Assay (FDA  approved for NASAL specimens     only), is one component of a     comprehensive MRSA colonization     surveillance program. It is not     intended to diagnose MRSA     infection nor to guide or     monitor treatment for     MRSA infections.  CBC     Status: Abnormal   Collection Time    07/18/13  9:21 PM      Result Value Range   WBC 10.8 (*) 4.0 - 10.5 K/uL   RBC  3.27 (*) 4.22 - 5.81 MIL/uL   Hemoglobin 11.7 (*) 13.0 - 17.0 g/dL   HCT 16.1 (*) 09.6 - 04.5 %   MCV 104.3 (*) 78.0 - 100.0 fL   MCH 35.8 (*) 26.0 - 34.0 pg   MCHC 34.3  30.0 - 36.0 g/dL   RDW 40.9  81.1 - 91.4 %   Platelets 179  150 - 400 K/uL  TSH     Status: None   Collection Time    07/18/13  9:21 PM      Result Value Range   TSH 2.436  0.350 - 4.500 uIU/mL   Comment: Performed at [facility]  TROPONIN I     Status: None   Collection Time    07/18/13  9:21 PM      Result Value Range   Troponin I <0.30  <0.30 ng/mL   Comment:            Due to the release kinetics of cTnI,     a negative result within the first hours     of the onset of symptoms does not rule out     myocardial infarction with certainty.     If myocardial infarction is still suspected,     repeat the test at appropriate intervals.  LIPASE, BLOOD     Status: Abnormal   Collection Time    07/18/13  9:21 PM      Result Value Range   Lipase 10 (*) 11 - 59 U/L  VITAMIN B12     Status: None   Collection Time    07/18/13  9:21 PM      Result Value Range   Vitamin B-12 690  211 - 911 pg/mL   Comment: Performed at [facility]  FOLATE RBC     Status: Abnormal   Collection Time    07/18/13  9:21 PM      Result Value Range   RBC Folate 1387 (*) >280 ng/mL   Comment: (NOTE)     Reference range not established for pediatric patients.     ** Please note change in reference range(s). **     Performed at [facility]  BASIC METABOLIC PANEL     Status: Abnormal   Collection Time    07/18/13 10:09 PM      Result Value Range   Sodium 136  135 - 145 mEq/L   Potassium 2.4 (*) 3.5 - 5.1 mEq/L   Comment: CRITICAL RESULT CALLED TO, READ BACK BY AND VERIFIED WITH:     HITT L,RN 07/19/13 0221 WAYK   Chloride 91 (*) 96 - 112 mEq/L   CO2 <7 (*) 19 - 32 mEq/L   Comment: CRITICAL RESULT CALLED TO, READ BACK BY AND VERIFIED WITH:     HITT L,RN 07/19/13 0221 WAYK   Glucose, Bld 187 (*) 70 - 99  mg/dL   BUN 14  6 - 23 mg/dL   Creatinine, Ser 1.61 (*) 0.50 - 1.35 mg/dL   Calcium 7.5 (*) 8.4 - 10.5 mg/dL   GFR calc non Af Amer >90  >90 mL/min   GFR calc Af Amer >90  >90 mL/min   Comment: (NOTE)     The eGFR has been calculated using the CKD EPI equation.     This calculation has not been validated in all clinical situations.     eGFR's persistently <90 mL/min signify possible Chronic Kidney     Disease.  GLUCOSE, CAPILLARY     Status: Abnormal   Collection Time    07/18/13 10:37 PM      Result Value Range   Glucose-Capillary 227 (*) 70 - 99 mg/dL   Comment 1 Notify RN     Comment 2 Documented in Chart    GLUCOSE, CAPILLARY     Status: Abnormal   Collection Time    07/18/13 11:45 PM      Result Value Range   Glucose-Capillary 196 (*) 70 - 99 mg/dL  GLUCOSE, CAPILLARY     Status: Abnormal   Collection Time    07/19/13 12:49 AM      Result Value Range   Glucose-Capillary 176 (*) 70 - 99 mg/dL   Comment 1 Notify RN     Comment 2 Documented in Chart    GLUCOSE, CAPILLARY     Status: Abnormal   Collection Time    07/19/13  1:54 AM      Result Value Range   Glucose-Capillary 180 (*) 70 - 99 mg/dL  GLUCOSE, CAPILLARY     Status: Abnormal   Collection Time    07/19/13  2:57 AM      Result Value Range   Glucose-Capillary 181 (*) 70 - 99 mg/dL  GLUCOSE, CAPILLARY     Status: Abnormal   Collection Time    07/19/13  3:58 AM      Result Value Range   Glucose-Capillary 147 (*) 70 - 99 mg/dL  TROPONIN I     Status: None   Collection Time    07/19/13  4:09 AM      Result Value Range   Troponin I <0.30  <0.30 ng/mL   Comment:            Due to the release kinetics of cTnI,     a negative result within the first hours     of the onset of symptoms does not rule out     myocardial infarction with certainty.     If myocardial infarction is still suspected,     repeat the test at appropriate intervals.  BASIC METABOLIC PANEL     Status: Abnormal   Collection Time     07/19/13  4:09 AM      Result Value Range   Sodium 135  135 - 145 mEq/L   Potassium 3.4 (*) 3.5 - 5.1 mEq/L   Comment: DELTA CHECK NOTED   Chloride 98  96 - 112 mEq/L   CO2 13 (*) 19 - 32 mEq/L   Glucose, Bld 150 (*) 70 - 99 mg/dL   BUN 16  6 - 23 mg/dL   Creatinine, Ser 0.96 (*) 0.50 - 1.35 mg/dL   Calcium 7.9 (*) 8.4 - 10.5 mg/dL   GFR calc non Af Amer >90  >90 mL/min   GFR calc Af Amer >90  >90 mL/min   Comment: (NOTE)     The eGFR has been calculated using  the CKD EPI equation.     This calculation has not been validated in all clinical situations.     eGFR's persistently <90 mL/min signify possible Chronic Kidney     Disease.  GLUCOSE, CAPILLARY     Status: Abnormal   Collection Time    07/19/13  5:00 AM      Result Value Range   Glucose-Capillary 157 (*) 70 - 99 mg/dL   Comment 1 Notify RN     Comment 2 Documented in Chart    GLUCOSE, CAPILLARY     Status: Abnormal   Collection Time    07/19/13  6:13 AM      Result Value Range   Glucose-Capillary 158 (*) 70 - 99 mg/dL   Comment 1 Notify RN     Comment 2 Documented in Chart    GLUCOSE, CAPILLARY     Status: Abnormal   Collection Time    07/19/13  7:25 AM      Result Value Range   Glucose-Capillary 118 (*) 70 - 99 mg/dL   Comment 1 Notify RN    BASIC METABOLIC PANEL     Status: Abnormal   Collection Time    07/19/13  7:55 AM      Result Value Range   Sodium 139  135 - 145 mEq/L   Potassium 3.0 (*) 3.5 - 5.1 mEq/L   Chloride 98  96 - 112 mEq/L   CO2 22  19 - 32 mEq/L   Glucose, Bld 103 (*) 70 - 99 mg/dL   BUN 18  6 - 23 mg/dL   Creatinine, Ser 0.98 (*) 0.50 - 1.35 mg/dL   Calcium 8.4  8.4 - 11.9 mg/dL   GFR calc non Af Amer >90  >90 mL/min   GFR calc Af Amer >90  >90 mL/min   Comment: (NOTE)     The eGFR has been calculated using the CKD EPI equation.     This calculation has not been validated in all clinical situations.     eGFR's persistently <90 mL/min signify possible Chronic Kidney     Disease.   HEMOGLOBIN AND HEMATOCRIT, BLOOD     Status: Abnormal   Collection Time    07/19/13  7:55 AM      Result Value Range   Hemoglobin 11.8 (*) 13.0 - 17.0 g/dL   HCT 14.7 (*) 82.9 - 56.2 %  GLUCOSE, CAPILLARY     Status: Abnormal   Collection Time    07/19/13  8:35 AM      Result Value Range   Glucose-Capillary 122 (*) 70 - 99 mg/dL  GLUCOSE, CAPILLARY     Status: Abnormal   Collection Time    07/19/13  9:44 AM      Result Value Range   Glucose-Capillary 131 (*) 70 - 99 mg/dL  LACTIC ACID, PLASMA     Status: None   Collection Time    07/19/13 10:24 AM      Result Value Range   Lactic Acid, Venous 2.2  0.5 - 2.2 mmol/L  TROPONIN I     Status: None   Collection Time    07/19/13 10:25 AM      Result Value Range   Troponin I <0.30  <0.30 ng/mL   Comment:            Due to the release kinetics of cTnI,     a negative result within the first hours     of the onset of symptoms does  not rule out     myocardial infarction with certainty.     If myocardial infarction is still suspected,     repeat the test at appropriate intervals.  BASIC METABOLIC PANEL     Status: Abnormal   Collection Time    07/19/13 10:25 AM      Result Value Range   Sodium 138  135 - 145 mEq/L   Potassium 3.2 (*) 3.5 - 5.1 mEq/L   Chloride 100  96 - 112 mEq/L   CO2 21  19 - 32 mEq/L   Glucose, Bld 147 (*) 70 - 99 mg/dL   BUN 19  6 - 23 mg/dL   Creatinine, Ser 4.09 (*) 0.50 - 1.35 mg/dL   Calcium 7.9 (*) 8.4 - 10.5 mg/dL   GFR calc non Af Amer >90  >90 mL/min   GFR calc Af Amer >90  >90 mL/min   Comment: (NOTE)     The eGFR has been calculated using the CKD EPI equation.     This calculation has not been validated in all clinical situations.     eGFR's persistently <90 mL/min signify possible Chronic Kidney     Disease.  HEPATIC FUNCTION PANEL     Status: Abnormal   Collection Time    07/19/13 10:25 AM      Result Value Range   Total Protein 6.3  6.0 - 8.3 g/dL   Albumin 3.3 (*) 3.5 - 5.2 g/dL   AST  811 (*) 0 - 37 U/L   ALT 78 (*) 0 - 53 U/L   Alkaline Phosphatase 231 (*) 39 - 117 U/L   Total Bilirubin 1.3 (*) 0.3 - 1.2 mg/dL   Bilirubin, Direct 0.6 (*) 0.0 - 0.3 mg/dL   Indirect Bilirubin 0.7  0.3 - 0.9 mg/dL  GLUCOSE, CAPILLARY     Status: Abnormal   Collection Time    07/19/13 10:54 AM      Result Value Range   Glucose-Capillary 161 (*) 70 - 99 mg/dL  BASIC METABOLIC PANEL     Status: Abnormal   Collection Time    07/19/13  1:06 PM      Result Value Range   Sodium 138  135 - 145 mEq/L   Potassium 2.7 (*) 3.5 - 5.1 mEq/L   Comment: CRITICAL RESULT CALLED TO, READ BACK BY AND VERIFIED WITH:     MILLICK D RN 07/19/13 1419 COSTELLO B   Chloride 98  96 - 112 mEq/L   CO2 24  19 - 32 mEq/L   Glucose, Bld 137 (*) 70 - 99 mg/dL   BUN 19  6 - 23 mg/dL   Creatinine, Ser 9.14 (*) 0.50 - 1.35 mg/dL   Calcium 8.1 (*) 8.4 - 10.5 mg/dL   GFR calc non Af Amer >90  >90 mL/min   GFR calc Af Amer >90  >90 mL/min   Comment: (NOTE)     The eGFR has been calculated using the CKD EPI equation.     This calculation has not been validated in all clinical situations.     eGFR's persistently <90 mL/min signify possible Chronic Kidney     Disease.  GLUCOSE, CAPILLARY     Status: Abnormal   Collection Time    07/19/13  2:15 PM      Result Value Range   Glucose-Capillary 181 (*) 70 - 99 mg/dL   Comment 1 Notify RN      US Abdomen Complete  07/19/2013   CLINICAL DATA:  Elevated liver function studies. Nausea. History of chronic pancreatitis in known fatty liver. Cholecystectomy.  EXAM: ULTRASOUND ABDOMEN COMPLETE  COMPARISON:  CT abdomen and pelvis 11/03/2012  FINDINGS: Gallbladder  Gallbladder is surgically absent.  Common bile duct  Diameter: 13.4 mm, likely normal for postoperative physiology. Stable since previous CT scan.  Liver  Diffusely increased hepatic echotexture suggesting diffuse fatty infiltration. No focal lesions are identified.  IVC  Not visualized due to overlying bowel gas.   Pancreas  Not visualized due to overlying bowel gas.  Spleen  Size and appearance within normal limits.  Right Kidney  Length: 12.4 cm. Echogenicity within normal limits. No mass or hydronephrosis visualized.  Left Kidney  Length: 11.2 cm. Echogenicity within normal limits. No mass or hydronephrosis visualized.  Abdominal aorta  Limited visualization. Visualized portions demonstrate atherosclerotic change but no evidence of aneurysm.  IMPRESSION: Surgical absence of the gallbladder. Diffuse fatty infiltration of the liver. No specific acute abnormalities demonstrated.   Electronically Signed   By: Burman Nieves M.D.   On: 07/19/2013 01:20   Dg Chest Portable 1 View  07/18/2013   CLINICAL DATA:  Shortness of breath, left-sided chest pain  EXAM: PORTABLE CHEST - 1 VIEW  COMPARISON:  December 11, 2012  FINDINGS: The heart size and mediastinal contours are within normal limits. Both lungs are clear. The visualized skeletal structures are unremarkable.  IMPRESSION: No active cardiopulmonary disease.   Electronically Signed   By: Sherian Rein M.D.   On: 07/18/2013 17:56               Blood pressure 131/63, pulse 121, temperature 98.3 F (36.8 C), temperature source Oral, resp. rate 22, height 5\' 8"  (1.727 m), weight 45.7 kg (100 lb 12 oz), SpO2 100.00%.  Physical exam:   General-- thin frail white male with multiple echymoses on his arms Heart-- regular rate and rhythm without murmurs are gallops Lungs--clear Abdomen-- soft nontender with good bowel sounds no ascites   Assessment: 1. History of hematemesis. Patients hemoglobin is stable and there does not seem to be gross G.I. bleeding at this time. I don't think he has a variceal bleed. He has been taking a lot of Tums at home. This could be alcoholic gastritis, esophagitis etc. 2.  Peripheral vascular disease with questionable need for stent revision. 3. CAD with stents in place 4. Alcohol abuse with chronic pancreatitis in history  pancreatic pseudocyst. Still drinking heavily   Plan: 1. Would go ahead and support with protonix for now. After cardiology has evaluated and cleared, we will plan on EGD hopefully Friday morning. If he bleeds heavily before then will certainly do it sooner. 2. The patient needs to decide whether he is going to stop drinking. If he continues on this current course his  life expectancy is limited   Ireta Pullman JR,Soni Kegel L 07/19/2013, 3:25 PM

## 2013-07-21 NOTE — Progress Notes (Signed)
1610 Dr Sunnie Nielsen made aware of CBG of 70 gave order to give 1 amp of D50. 0920 pt CBG 187. 0930 pt went off the unit to endo via bed accompanied by RN and tech.

## 2013-07-21 NOTE — Op Note (Signed)
[name] [address] [place], [address]   ENDOSCOPY PROCEDURE REPORT  PATIENT: Mitchell Mccall, Mitchell Mccall  MR#: [id number] BIRTHDATE: 1950-11-05 , 62  yrs. old GENDER: Male ENDOSCOPIST:Naphtali Zywicki Randa Evens, MD REFERRED BY: [facility]. PCP: Dr. Tenny Craw PROCEDURE DATE:  07/21/2013 PROCEDURE: EGD with biopsy in brushing ASA CLASS:   class III INDICATIONS:   patient with a history of alcohol abuse admitted with electrolyte abnormalities, DKA, and hematemesis MEDICATION:  fentanyl 25 mcg, versed 4 mg IV TOPICAL ANESTHETIC:    cetacaine spray  DESCRIPTION OF PROCEDURE:   The Pentax adult endoscope was inserted blindly into the esophagus with swallowing. Immediately the patient was seen to have severe ulcerative esophagitis extending 2/3 to 3/4 of the way up the esophagus coming more severe in the distal esophagus. There were multiple shallow confluent ulcerations. There were no gross varices. The ulcers or brushed with cytology brush for fungal smear. Biopsies were obtained for fungal culture and for routine culture. The stomach was entered and there was no active bleeding in the stomach. It was normal in the forward and retroflex view. The pyloric channel and antrum were normal. The duodenal bulb is markedly inflamed consistently duodenitis with several erosion Was no ulceration. The scope was withdrawn in the initial findings were confirmed. the patient was monitored throughout and was somewhat hypotensive but was given IV fluids.     COMPLICATIONS: None  ENDOSCOPIC IMPRESSION: 1.Severe Ulcerative Esophagitis. This extends from the GE junction through three quarters of the esophagus. Biopsies and cultures on smear for fungus were obtained. 2. Severe duodenitis with erosions without ulceration RECOMMENDATIONS: 1. we will add Carafate suspension to his medications in advance to a low residue diet.    _______________________________ Rosalie DoctorCarman Ching, MD 07/21/2013 10:42 AM

## 2013-07-21 NOTE — Progress Notes (Signed)
[facility] PROGRESS NOTE  Mitchell Mccall YNW:[id number] DOB: 08-Feb-1951 DOA: 07/18/2013 PCP:  Duane Lope, MD  Assessment/Plan: 1.Metabolic Acidosis; he presents with increase anion gap metabolic acidosis. Gap at 38. Multifactorial DKA, acidosis from GI losses.  -Resolved. Gap close.   2-Nausea, vomiting, coffee ground emesis: Continue with  IV protonix. GI consulted. Hold plavix for now. Endoscopy show severe esophagitis.    3-Transaminases: Abdominal US: Surgical absence of the gallbladder. Diffuse fatty infiltration of the liver. No specific acute abnormalities demonstrated. GI consulted. Trending down. Repeat in am.   4-Diabetes, DKA: see # 1. Resolved.   5-Alcohol use, last drink Saturday. Continue CIWA protocol.   6-Chronic pancreatitis. Might explain some component of abdominal pain. Abdominal pain better.   7-Diarrhea: C diff negative.   8-DVT prophylaxis; SCD. Hold heparin due to GI bleed.   9-Hypoglycemia: Continue with  levemir to 5 units. Improved.  10-Hypokalemia: replete with 3 runs IV.  12-Blurry vision, headache: MRI brain ordered.   Code Status: Patient wishes to be a Full Code.  Family Communication: care discussed with patient.  Disposition Plan: remain in the step down.    Consultants:  GI.   Procedures:  none  Antibiotics:  none  HPI/Subjective: Alert, answering questions. Denies abdominal pain. No vomiting overnight.  eeling better.   Objective: Filed Vitals:   07/21/13 1150  BP: 112/75  Pulse: 106  Temp:   Resp:     Intake/Output Summary (Last 24 hours) at 07/21/13 1350 Last data filed at 07/21/13 1100  Gross per 24 hour  Intake   2135 ml  Output   2250 ml  Net   -115 ml   Filed Weights   07/18/13 2105  Weight: 45.7 kg (100 lb 12 oz)    Exam:   General:  No distress.   Cardiovascular: S 1, S 2 RRR  Respiratory: CTA  Abdomen: Bs present, NT, NR  Musculoskeletal: no edema  Data Reviewed: Basic Metabolic  Panel:  Recent Labs Lab 07/19/13 1555 07/19/13 1830 07/20/13 0540 07/20/13 1112 07/21/13 0600  NA 131* 132* 134* 131* 132*  K 2.9* 2.9* 2.7* 3.2* 3.4*  CL 91* 96 96 94* 96  CO2 20 25 26 28 30   GLUCOSE 265* 115* 27* 195* 99  BUN 14 12 8 7  3*  CREATININE 0.29* 0.27* 0.24* 0.24* 0.25*  CALCIUM 8.0* 7.8* 8.2* 7.7* 7.6*   Liver Function Tests:  Recent Labs Lab 07/18/13 1735 07/19/13 1025 07/21/13 0600  AST 264* 106* 85*  ALT 131* 78* 53  ALKPHOS 356* 231* 167*  BILITOT 1.9* 1.3* 0.6  PROT 8.0 6.3 5.0*  ALBUMIN 4.1 3.3* 2.5*    Recent Labs Lab 07/18/13 2121  LIPASE 10*   No results found for this basename: AMMONIA,  in the last 168 hours CBC:  Recent Labs Lab 07/18/13 1735 07/18/13 2121 07/19/13 0755 07/19/13 1624 07/20/13 0540 07/21/13 0600  WBC 10.8* 10.8*  --   --  14.5* 6.5  NEUTROABS 9.1*  --   --   --   --   --   HGB 13.8 11.7* 11.8* 10.6* 10.7* 8.7*  HCT 39.1 34.1* 32.8* 29.6* 29.3* 23.9*  MCV 104.0* 104.3*  --   --  98.7 98.0  PLT 203 179  --   --  136* 85*   Cardiac Enzymes:  Recent Labs Lab 07/18/13 2121 07/19/13 0409 07/19/13 1025  TROPONINI <0.30 <0.30 <0.30   BNP (last 3 results) No results found for this basename: PROBNP,  in  the last 8760 hours CBG:  Recent Labs Lab 07/21/13 0435 07/21/13 0803 07/21/13 0920 07/21/13 1143 07/21/13 1249  GLUCAP 144* 70 187* 146* 143*    Recent Results (from the past 240 hour(s))  MRSA PCR SCREENING     Status: None   Collection Time    07/18/13  9:21 PM      Result Value Range Status   MRSA by PCR NEGATIVE  NEGATIVE Final   Comment:            The GeneXpert MRSA Assay (FDA     approved for NASAL specimens     only), is one component of a     comprehensive MRSA colonization     surveillance program. It is not     intended to diagnose MRSA     infection nor to guide or     monitor treatment for     MRSA infections.  CULTURE, BLOOD (ROUTINE X 2)     Status: None   Collection Time     07/18/13 10:59 PM      Result Value Range Status   Specimen Description BLOOD LEFT FOREARM   Final   Special Requests BOTTLES DRAWN AEROBIC ONLY 4CC   Final   Culture  Setup Time     Final   Value: 07/19/2013 08:59     Performed at [facility]   Culture     Final   Value:        BLOOD CULTURE RECEIVED NO GROWTH TO DATE CULTURE WILL BE HELD FOR 5 DAYS BEFORE ISSUING A FINAL NEGATIVE REPORT     Performed at [facility]   Report Status PENDING   Incomplete  CULTURE, BLOOD (ROUTINE X 2)     Status: None   Collection Time    07/18/13 11:08 PM      Result Value Range Status   Specimen Description BLOOD LEFT HAND   Final   Special Requests BOTTLES DRAWN AEROBIC ONLY 2CC   Final   Culture  Setup Time     Final   Value: 07/19/2013 08:58     Performed at [facility]   Culture     Final   Value:        BLOOD CULTURE RECEIVED NO GROWTH TO DATE CULTURE WILL BE HELD FOR 5 DAYS BEFORE ISSUING A FINAL NEGATIVE REPORT     Performed at [facility]   Report Status PENDING   Incomplete  STOOL CULTURE     Status: None   Collection Time    07/19/13  9:59 PM      Result Value Range Status   Specimen Description STOOL   Final   Special Requests NONE   Final   Culture     Final   Value: Culture reincubated for better growth     Performed at [facility]   Report Status PENDING   Incomplete  CLOSTRIDIUM DIFFICILE BY PCR     Status: None   Collection Time    07/19/13  9:59 PM      Result Value Range Status   C difficile by pcr NEGATIVE  NEGATIVE Final     Studies: No results found.  Scheduled Meds: . DULoxetine  30 mg Oral BID  . folic acid  1 mg Intravenous Daily  . gabapentin  300 mg Oral TID  . insulin aspart  0-9 Units Subcutaneous TID WC  . insulin detemir  5 Units Subcutaneous QHS  .  multivitamin with minerals  1 tablet Oral Daily  . nicotine  14 mg Transdermal Daily  . pantoprazole (PROTONIX) IV  40 mg Intravenous Q12H  . sodium chloride   10-40 mL Intracatheter Q12H  . sodium chloride  3 mL Intravenous Q12H  . sucralfate  1 g Oral TID WC & HS  . thiamine  100 mg Intravenous Daily   Continuous Infusions: . sodium chloride 125 mL/hr at 07/20/13 1900    Principal Problem:   DKA, type 2 Active Problems:   PVD RCIA PTA 11/12, LCIA PTA  Jan 2013 - patent left CIA stent, right CIA stenosis via PV angio 06/01/13   Hypertension   Pancreatic pseudocyst   DM (diabetes mellitus), type 2, uncontrolled with complications   Chronic alcohol dependence, continuous   Hypokalemia   Metabolic acidosis   Diarrhea   Intravascular volume depletion   Protein-calorie malnutrition, severe    Time spent: 35 minutes.     Mitchell Mccall  [facility] Pager [phone]. If 7PM-7AM, please contact night-coverage at [email or web address], password [facility] 07/21/2013, 1:50 PM  LOS: 3 days

## 2013-07-21 NOTE — Interval H&P Note (Signed)
History and Physical Interval Note:  07/21/2013 9:56 AM  Mitchell Mccall  has presented today for surgery, with the diagnosis of hematemesis  The various methods of treatment have been discussed with the patient and family. After consideration of risks, benefits and other options for treatment, the patient has consented to  Procedure(s): ESOPHAGOGASTRODUODENOSCOPY (EGD) (N/A) as a surgical intervention .  The patient's history has been reviewed, patient examined, no change in status, stable for surgery.  I have reviewed the patient's chart and labs.  Questions were answered to the patient's satisfaction.     Shiya Fogelman JR,Miaa Latterell L

## 2013-07-22 DIAGNOSIS — F10231 Alcohol dependence with withdrawal delirium: Secondary | ICD-10-CM

## 2013-07-22 LAB — GLUCOSE, CAPILLARY
Glucose-Capillary: 82 mg/dL (ref 70–99)
Glucose-Capillary: 141 mg/dL — ABNORMAL HIGH (ref 70–99)

## 2013-07-22 LAB — CBC
Hemoglobin: 8.9 g/dL — ABNORMAL LOW (ref 13.0–17.0)
MCH: 35.5 pg — ABNORMAL HIGH (ref 26.0–34.0)
MCV: 98.4 fL (ref 78.0–100.0)
RBC: 2.51 MIL/uL — ABNORMAL LOW (ref 4.22–5.81)
RDW: 13.3 % (ref 11.5–15.5)
WBC: 6.3 10*3/uL (ref 4.0–10.5)

## 2013-07-22 MED ORDER — LORAZEPAM 1 MG PO TABS
1.0000 mg | ORAL_TABLET | Freq: Four times a day (QID) | ORAL | Status: AC | PRN
  Administered 2013-07-23 – 2013-07-24 (×2): 1 mg via ORAL
  Filled 2013-07-22 (×2): qty 1

## 2013-07-22 MED ORDER — LOPERAMIDE HCL 2 MG PO CAPS
2.0000 mg | ORAL_CAPSULE | ORAL | Status: DC | PRN
  Administered 2013-07-22 – 2013-07-25 (×5): 2 mg via ORAL
  Filled 2013-07-22 (×5): qty 1

## 2013-07-22 MED ORDER — LORAZEPAM 2 MG/ML IJ SOLN
1.0000 mg | Freq: Four times a day (QID) | INTRAMUSCULAR | Status: AC | PRN
  Administered 2013-07-23: 1 mg via INTRAVENOUS
  Filled 2013-07-22: qty 1

## 2013-07-22 MED ORDER — PANTOPRAZOLE SODIUM 40 MG PO TBEC: 40.0000 mg | DELAYED_RELEASE_TABLET | Freq: Two times a day (BID) | ORAL | Status: DC

## 2013-07-22 MED ORDER — FOLIC ACID 1 MG PO TABS
1.0000 mg | ORAL_TABLET | Freq: Every day | ORAL | Status: DC
  Administered 2013-07-23 – 2013-07-25 (×3): 1 mg via ORAL
  Filled 2013-07-22 (×3): qty 1

## 2013-07-22 MED ORDER — VITAMIN B-1 100 MG PO TABS
100.0000 mg | ORAL_TABLET | Freq: Every day | ORAL | Status: DC
  Administered 2013-07-23 – 2013-07-25 (×3): 100 mg via ORAL
  Filled 2013-07-22 (×3): qty 1

## 2013-07-22 MED ORDER — ZOLPIDEM TARTRATE 5 MG PO TABS: 5.0000 mg | ORAL_TABLET | Freq: Every evening | ORAL | Status: DC | PRN

## 2013-07-22 MED ORDER — PANTOPRAZOLE SODIUM 40 MG IV SOLR
40.0000 mg | Freq: Two times a day (BID) | INTRAVENOUS | Status: DC
  Administered 2013-07-22 – 2013-07-24 (×4): 40 mg via INTRAVENOUS
  Filled 2013-07-22 (×8): qty 40

## 2013-07-22 NOTE — Progress Notes (Signed)
[facility] PROGRESS NOTE  Mitchell Mccall ZOX:[id number] DOB: 1950/12/17 DOA: 07/18/2013 PCP:  Duane Lope, MD  Assessment/Plan: 1.Metabolic Acidosis; he presents with increase anion gap metabolic acidosis. Gap at 38. Multifactorial DKA, acidosis from GI losses.  -Resolved. Gap close.   2-Nausea, vomiting, coffee ground emesis: Continue with  IV protonix. GI consulted. Hold plavix for now. Endoscopy show severe ulcerative esophagitis. Tolerating diet.   3-Transaminases: Abdominal US: Surgical absence of the gallbladder. Diffuse fatty infiltration of the liver. No specific acute abnormalities demonstrated. GI consulted. Trending down.   4-Diabetes, DKA: see # 1. Resolved.   5-Alcohol use, last drink Saturday. Continue CIWA protocol.   6-Chronic pancreatitis. Might explain some component of abdominal pain. Abdominal pain better.   7-Diarrhea: improved. C diff negative.   8-DVT prophylaxis; SCD. Hold heparin due to GI bleed.   9-Hypoglycemia: Continue with  levemir to 5 units. Will order snack at bed time. I have ask nursing staff to call prior administration of SSI. Patient will likely need less amount of insulin that what said the SSI. Patient started to eat more.   10-Hypokalemia: B-met pending.   12-Blurry vision, headache: MRI brain negative. Could be related to alcohol withdraw, episode of hypoglycemia.  Improving.   13-Acute blood loss; in setting GI bleed. Severe ulcerative esophagitis,  severe duodenitis with erosions without ulceration. Hb pending for this morning.    Code Status: Patient wishes to be a Full Code.  Family Communication: care discussed with patient.  Disposition Plan: transfer to telemetry. .    Consultants:  GI.   Procedures:  none  Antibiotics:  none  HPI/Subjective: Tolerating diet. Feeling better. Blurry vision improved.   Objective: Filed Vitals:   07/22/13 0349  BP: 115/63  Pulse: 103  Temp: 98.3 F (36.8 C)  Resp: 16     Intake/Output Summary (Last 24 hours) at 07/22/13 0739 Last data filed at 07/22/13 0600  Gross per 24 hour  Intake 3496.25 ml  Output   4075 ml  Net -578.75 ml   Filed Weights   07/18/13 2105  Weight: 45.7 kg (100 lb 12 oz)    Exam:   General:  No distress.   Cardiovascular: S 1, S 2 RRR  Respiratory: CTA  Abdomen: Bs present, NT, NR  Musculoskeletal: no edema  Data Reviewed: Basic Metabolic Panel:  Recent Labs Lab 07/19/13 1555 07/19/13 1830 07/20/13 0540 07/20/13 1112 07/21/13 0600  NA 131* 132* 134* 131* 132*  K 2.9* 2.9* 2.7* 3.2* 3.4*  CL 91* 96 96 94* 96  CO2 20 25 26 28 30   GLUCOSE 265* 115* 27* 195* 99  BUN 14 12 8 7  3*  CREATININE 0.29* 0.27* 0.24* 0.24* 0.25*  CALCIUM 8.0* 7.8* 8.2* 7.7* 7.6*   Liver Function Tests:  Recent Labs Lab 07/18/13 1735 07/19/13 1025 07/21/13 0600  AST 264* 106* 85*  ALT 131* 78* 53  ALKPHOS 356* 231* 167*  BILITOT 1.9* 1.3* 0.6  PROT 8.0 6.3 5.0*  ALBUMIN 4.1 3.3* 2.5*    Recent Labs Lab 07/18/13 2121  LIPASE 10*   No results found for this basename: AMMONIA,  in the last 168 hours CBC:  Recent Labs Lab 07/18/13 1735 07/18/13 2121 07/19/13 0755 07/19/13 1624 07/20/13 0540 07/21/13 0600  WBC 10.8* 10.8*  --   --  14.5* 6.5  NEUTROABS 9.1*  --   --   --   --   --   HGB 13.8 11.7* 11.8* 10.6* 10.7* 8.7*  HCT 39.1 34.1*  32.8* 29.6* 29.3* 23.9*  MCV 104.0* 104.3*  --   --  98.7 98.0  PLT 203 179  --   --  136* 85*   Cardiac Enzymes:  Recent Labs Lab 07/18/13 2121 07/19/13 0409 07/19/13 1025  TROPONINI <0.30 <0.30 <0.30   BNP (last 3 results) No results found for this basename: PROBNP,  in the last 8760 hours CBG:  Recent Labs Lab 07/21/13 1642 07/21/13 2032 07/21/13 2311 07/22/13 0347 07/22/13 0415  GLUCAP 336* 285* 116* 38* 124*    Recent Results (from the past 240 hour(s))  MRSA PCR SCREENING     Status: None   Collection Time    07/18/13  9:21 PM      Result Value  Range Status   MRSA by PCR NEGATIVE  NEGATIVE Final   Comment:            The GeneXpert MRSA Assay (FDA     approved for NASAL specimens     only), is one component of a     comprehensive MRSA colonization     surveillance program. It is not     intended to diagnose MRSA     infection nor to guide or     monitor treatment for     MRSA infections.  CULTURE, BLOOD (ROUTINE X 2)     Status: None   Collection Time    07/18/13 10:59 PM      Result Value Range Status   Specimen Description BLOOD LEFT FOREARM   Final   Special Requests BOTTLES DRAWN AEROBIC ONLY 4CC   Final   Culture  Setup Time     Final   Value: 07/19/2013 08:59     Performed at [facility]   Culture     Final   Value:        BLOOD CULTURE RECEIVED NO GROWTH TO DATE CULTURE WILL BE HELD FOR 5 DAYS BEFORE ISSUING A FINAL NEGATIVE REPORT     Performed at [facility]   Report Status PENDING   Incomplete  CULTURE, BLOOD (ROUTINE X 2)     Status: None   Collection Time    07/18/13 11:08 PM      Result Value Range Status   Specimen Description BLOOD LEFT HAND   Final   Special Requests BOTTLES DRAWN AEROBIC ONLY 2CC   Final   Culture  Setup Time     Final   Value: 07/19/2013 08:58     Performed at [facility]   Culture     Final   Value:        BLOOD CULTURE RECEIVED NO GROWTH TO DATE CULTURE WILL BE HELD FOR 5 DAYS BEFORE ISSUING A FINAL NEGATIVE REPORT     Performed at [facility]   Report Status PENDING   Incomplete  STOOL CULTURE     Status: None   Collection Time    07/19/13  9:59 PM      Result Value Range Status   Specimen Description STOOL   Final   Special Requests NONE   Final   Culture     Final   Value: Culture reincubated for better growth     Performed at [facility]   Report Status PENDING   Incomplete  CLOSTRIDIUM DIFFICILE BY PCR     Status: None   Collection Time    07/19/13  9:59 PM      Result Value Range Status   C difficile  by pcr NEGATIVE   NEGATIVE Final     Studies: Mr Brain Wo Contrast  07/21/2013   CLINICAL DATA:  Double vision, headaches  EXAM: MRI HEAD WITHOUT CONTRAST  TECHNIQUE: Multiplanar, multiecho pulse sequences of the brain and surrounding structures were obtained without intravenous contrast.  COMPARISON:  Prior CT from 05/01/2010 and MRI from 05/15/2009.  FINDINGS: Study is somewhat degraded by motion artifact.  Scattered and confluent foci of hyperintense T2/FLAIR signal within the periventricular and deep white matter of both frontal lobes are seen, nonspecific, but likely related to chronic small vessel ischemic changes. Similar changes are seen within the pons. Overall, these findings are not significantly changed relative to prior MRI from 05/15/2009.  No diffusion-weighted signal abnormality to suggest acute intracranial infarct is identified. Normal flow voids are seen within the intracranial vasculature. No intracranial hemorrhage.  No mass lesion, midline shift, or localized mass effect. Mild prominence of the CSF containing spaces is compatible with generalized age-related atrophy. Ventricles are normal in size without evidence of hydrocephalus. There is no extra-axial fluid collection.  The craniocervical junction is within normal limits. Incidental note is made of a partially empty sella. Orbital soft tissues, optic nerves, and optic chiasm are within normal limits.  The calvarium demonstrates a normal appearance with normal signal intensity.  Paranasal sinuses and mastoid air cells are well pneumatized and free of fluid.  IMPRESSION: 1. No acute intracranial abnormality identified. 2. Mild atrophy and chronic microvascular ischemic disease, similar as compared to prior MRI from 05/15/2009.   Electronically Signed   By: Rise Mu M.D.   On: 07/21/2013 23:20    Scheduled Meds: . DULoxetine  30 mg Oral BID  . folic acid  1 mg Intravenous Daily  . gabapentin  300 mg Oral TID  . insulin aspart  0-9 Units  Subcutaneous TID WC  . insulin detemir  5 Units Subcutaneous QHS  . multivitamin with minerals  1 tablet Oral Daily  . nicotine  14 mg Transdermal Daily  . pantoprazole (PROTONIX) IV  40 mg Intravenous Q12H  . sodium chloride  10-40 mL Intracatheter Q12H  . sodium chloride  3 mL Intravenous Q12H  . sucralfate  1 g Oral TID WC & HS  . thiamine  100 mg Intravenous Daily   Continuous Infusions: . sodium chloride 125 mL/hr at 07/22/13 0400    Principal Problem:   DKA, type 2 Active Problems:   PVD RCIA PTA 11/12, LCIA PTA  Jan 2013 - patent left CIA stent, right CIA stenosis via PV angio 06/01/13   Hypertension   Pancreatic pseudocyst   DM (diabetes mellitus), type 2, uncontrolled with complications   Chronic alcohol dependence, continuous   Hypokalemia   Metabolic acidosis   Diarrhea   Intravascular volume depletion   Protein-calorie malnutrition, severe    Time spent: 35 minutes.     Clester Chlebowski  [facility] Pager [phone]. If 7PM-7AM, please contact night-coverage at [email or web address], password [facility] 07/22/2013, 7:39 AM  LOS: 4 days

## 2013-07-22 NOTE — Progress Notes (Signed)
Report received for transfer to 5W29 from 3S

## 2013-07-22 NOTE — Progress Notes (Signed)
NURSING PROGRESS NOTE  EOIN WILLDEN [id number] Transfer Data: 07/22/2013 3:26 PM Attending Provider: Alba Cory, MD PCP: Duane Lope, MD Code Status: Full  ERBIE ARMENT is a 62 y.o. male patient transferred from 3S -No acute distress noted.  -No complaints of shortness of breath.  -No complaints of chest pain.   Cardiac Monitoring: Box # 10 in place. Cardiac monitor yields:sinus tachycardia.  Blood pressure 106/70, pulse 109, temperature 97.6 F (36.4 C), temperature source Axillary, resp. rate 18, height 5\' 8"  (1.727 m), weight 51.7 kg (113 lb 15.7 oz), SpO2 99.00%.   IV Fluids:  IV in place, occlusive dsg intact without redness, PICC double lumen left upper arm. No signs of infiltration or phlebitis. NS at 156ml/hr infusing.    Allergies:  Review of patient's allergies indicates no known allergies.  Past Medical History:   has a past medical history of Peripheral vascular disease (Jan 2013, Nov 2012); GERD (gastroesophageal reflux disease); Stomach ulcer; Hypertension; Anxiety; Depression; H/O hiatal hernia; Anemia; Undescended testicle; Colon polyps; Chronic lower back pain; Subdural hematoma (JULY 2006); Duodenitis; Esophagitis; Coarse tremors; Diverticulosis; Rib fracture (DEC 2005); Rectal bleed (JULY 2004); Vitamin D deficiency; Macrocytosis; DTs (delirium tremens); Claudication in peripheral vascular disease; Pancreatitis, alcoholic; Fatty liver, alcoholic; Hemorrhoids; Pseudoaneurysm of femoral artery, 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  Past Surgical History:   has past surgical history that includes Angioplasty / stenting iliac (Jan 2013, Nov 2012); Ligament repair (Left, 1955); False aneurysm repair (09/16/2011); Incision and drainage of wound; EUS (01/20/2012); Fine needle aspiration (01/20/2012);  Cholecystectomy (05/06/2012); and Ligament repair (Right, 1990's).  Social History:   reports that he has been smoking Cigarettes.  He has a 50 pack-year smoking history. He has never used smokeless tobacco. He reports that he drinks about 12.6 ounces of alcohol per week. He reports that he does not use illicit drugs.  Skin: intact  Patient/Family orientated to room. Information packet given to patient/family. Admission inpatient armband information verified with patient/family to include name and date of birth and placed on patient arm. Side rails up x 2, fall assessment and education completed with patient/family. Patient/family able to verbalize understanding of risk associated with falls and verbalized understanding to call for assistance before getting out of bed. Call light within reach. Patient/family able to voice and demonstrate understanding of unit orientation instructions.    Will continue to evaluate and treat per MD orders.

## 2013-07-22 NOTE — Progress Notes (Signed)
SW made visit to find Patient in bed asleep, no family present. SW reviewed chart and will contact family to discuss discharge planning.  SW will continue to follow.  Maretta Los, LCSW, ACHP-SW [facility] [phone]

## 2013-07-22 NOTE — Progress Notes (Signed)
Mitchell Mccall 10:12 AM  Subjective: Patient without any signs of bleeding or any new complaints and eating okay  Objective: Vital signs stable afebrile no acute distress abdomen is nontender hemoglobin stable  Assessment: Improved  Plan: Continue twice a day pump inhibitors call us back when necessary alcohol rehabilitation and followup as an outpatient with his primary gastroenterologist although he does not know who that is today  [facility] E

## 2013-07-22 NOTE — Progress Notes (Addendum)
Hypoglycemic Event  CBG: 38  Treatment: D50 IV 25 mL  Symptoms: None  Follow-up CBG: Time: 0415 CBG Result:124  Possible Reasons for Event: Unknown  Comments/MD notified:M. Lynch    [place], [place]  Remember to initiate Hypoglycemia Order Set & complete

## 2013-07-22 NOTE — Progress Notes (Signed)
Related admission to [facility] diagnosis of chronic pancreatitis.  Patient is a full code.  Found patient resting, no family present.  FLACC score 0.  Reviewed chart and patient has been afebrile with stable Hgb and denies abdominal pain.  Continue supportive care and anticipate discharge home with continued Hospice services.  Encourage a call to Hospice at [phone] with any needs.  April Costella Hatcher, RN, BSN Hospice

## 2013-07-22 NOTE — Progress Notes (Signed)
Gave report to [employer] on 5W, transferred patient to 5w29, and notified patients wife.   Mitchell Mccall

## 2013-07-22 NOTE — Evaluation (Signed)
Physical Therapy Evaluation Patient Details Name: Mitchell Mccall MRN: [id number] DOB: 10-08-1950 Today's Date: 07/22/2013 Time: 7829-5621 PT Time Calculation (min): 6 min  PT Assessment / Plan / Recommendation History of Present Illness  Pt adm with N/V and abd pain.  Pt with DKA due to metabolic acidosis. Pt with history of alcohol abuse.  Clinical Impression  Pt admitted with above. Pt currently with functional limitations due to the deficits listed below (see PT Problem List). Unsure of pt's home situation (pt stated he lives alone but other notes mention wife and daughters). At his current level if pt doesn't have assist at home feel ST-SNF is the best option. Pt not agreeable to this option and he thinks he will recover quickly.  If pt doesn't improve rapidly and continues to refuse ST-SNF would recommend HHPT. Pt will benefit from skilled PT to increase their independence and safety with mobility to allow discharge to the most appropriate venue.    PT Assessment  Patient needs continued PT services    Follow Up Recommendations  Other (comment) Feel ST-SNF is best option if family not available to assist but pt refusing this at this time. If pt continues to refuse this option than HHPT with as much support as available.    Does the patient have the potential to tolerate intense rehabilitation      Barriers to Discharge        Equipment Recommendations  None recommended by PT    Recommendations for Other Services     Frequency Min 3X/week    Precautions / Restrictions Precautions Precautions: Fall Restrictions Weight Bearing Restrictions: No   Pertinent Vitals/Pain VSS      Mobility  Bed Mobility Bed Mobility: Sit to Supine Sit to Supine: 6: Modified independent (Device/Increase time) Transfers Transfers: Sit to Stand;Stand to Sit Sit to Stand: 2: Max assist;With upper extremity assist;From toilet Stand to Sit: 4: Min assist;With upper extremity assist;To bed Details  for Transfer Assistance: Heavy assist to stand from toilet due to the very low height. Ambulation/Gait Ambulation/Gait Assistance: 4: Min assist Ambulation Distance (Feet): 12 Feet Assistive device: 1 person hand held assist Ambulation/Gait Assistance Details: Verbal cues to stand more erect. Gait Pattern: Step-through pattern;Decreased step length - right;Decreased step length - left;Shuffle;Trunk flexed    Exercises     PT Diagnosis: Difficulty walking;Generalized weakness  PT Problem List: Decreased strength;Decreased activity tolerance;Decreased balance;Decreased mobility;Decreased knowledge of use of DME PT Treatment Interventions: DME instruction;Gait training;Functional mobility training;Therapeutic activities;Therapeutic exercise;Balance training;Patient/family education     PT Goals(Current goals can be found in the care plan section) Acute Rehab PT Goals Patient Stated Goal: go home PT Goal Formulation: With patient Time For Goal Achievement: 07/22/13 Potential to Achieve Goals: Good  Visit Information  Last PT Received On: 07/22/13 Assistance Needed: +1 History of Present Illness: Pt adm with N/V and abd pain.  Pt with DKA due to metabolic acidosis. Pt with history of alcohol abuse.       Prior Functioning  Home Living Family/patient expects to be discharged to:: Private residence Type of Home: House Home Access: Level entry Home Layout: One level Home Equipment: Cane - single point Additional Comments: Pt told me he lived alone although per other notes in chart it looks like pt has spouse and daughters. Prior Function Level of Independence: Independent with assistive device(s) Communication Communication: No difficulties    Cognition  Cognition Arousal/Alertness: Awake/alert Behavior During Therapy: WFL for tasks assessed/performed    Extremity/Trunk Assessment Upper Extremity  Assessment Upper Extremity Assessment: Generalized weakness Lower Extremity  Assessment Lower Extremity Assessment: Generalized weakness   Balance    End of Session PT - End of Session Activity Tolerance: Patient limited by fatigue Patient left: in bed;with bed alarm set;with call bell/phone within reach Nurse Communication: Mobility status  GP     [facility] 07/22/2013, 4:29 PM  [facility] PT [phone]

## 2013-07-23 ENCOUNTER — Encounter (HOSPITAL_COMMUNITY): Payer: Self-pay

## 2013-07-23 DIAGNOSIS — F1020 Alcohol dependence, uncomplicated: Secondary | ICD-10-CM

## 2013-07-23 LAB — GLUCOSE, CAPILLARY
Glucose-Capillary: 165 mg/dL — ABNORMAL HIGH (ref 70–99)
Glucose-Capillary: 189 mg/dL — ABNORMAL HIGH (ref 70–99)
Glucose-Capillary: 246 mg/dL — ABNORMAL HIGH (ref 70–99)
Glucose-Capillary: 265 mg/dL — ABNORMAL HIGH (ref 70–99)
Glucose-Capillary: 289 mg/dL — ABNORMAL HIGH (ref 70–99)
Glucose-Capillary: 58 mg/dL — ABNORMAL LOW (ref 70–99)
Glucose-Capillary: 60 mg/dL — ABNORMAL LOW (ref 70–99)
Glucose-Capillary: 64 mg/dL — ABNORMAL LOW (ref 70–99)
Glucose-Capillary: 75 mg/dL (ref 70–99)

## 2013-07-23 LAB — BASIC METABOLIC PANEL
BUN: 5 mg/dL — ABNORMAL LOW (ref 6–23)
CO2: 28 mEq/L (ref 19–32)
Calcium: 7.8 mg/dL — ABNORMAL LOW (ref 8.4–10.5)
Calcium: 7.2 mg/dL — ABNORMAL LOW (ref 8.4–10.5)
Chloride: 93 mEq/L — ABNORMAL LOW (ref 96–112)
Creatinine, Ser: 0.28 mg/dL — ABNORMAL LOW (ref 0.50–1.35)
Creatinine, Ser: 0.37 mg/dL — ABNORMAL LOW (ref 0.50–1.35)
GFR calc Af Amer: >90 mL/min (ref >90)
GFR calc Af Amer: >90 mL/min (ref >90)
GFR calc non Af Amer: >90 mL/min (ref >90)
GFR calc non Af Amer: >90 mL/min (ref >90)
Sodium: 126 mEq/L — ABNORMAL LOW (ref 135–145)

## 2013-07-23 LAB — CBC
HCT: 24.6 % — ABNORMAL LOW (ref 39.0–52.0)
MCH: 35.7 pg — ABNORMAL HIGH (ref 26.0–34.0)
MCHC: 36.6 g/dL — ABNORMAL HIGH (ref 30.0–36.0)
MCV: 97.6 fL (ref 78.0–100.0)
Platelets: 122 10*3/uL — ABNORMAL LOW (ref 150–400)
RDW: 13.3 % (ref 11.5–15.5)
WBC: 6.1 10*3/uL (ref 4.0–10.5)

## 2013-07-23 LAB — MAGNESIUM: Magnesium: 1.3 mg/dL — ABNORMAL LOW (ref 1.5–2.5)

## 2013-07-23 MED ORDER — TRAMADOL HCL 50 MG PO TABS
50.0000 mg | ORAL_TABLET | Freq: Four times a day (QID) | ORAL | Status: DC | PRN
  Administered 2013-07-23 – 2013-07-25 (×7): 50 mg via ORAL
  Filled 2013-07-23 (×7): qty 1

## 2013-07-23 MED ORDER — POTASSIUM CHLORIDE 10 MEQ/100ML IV SOLN
10.0000 meq | INTRAVENOUS | Status: AC
  Administered 2013-07-23 (×4): 10 meq via INTRAVENOUS
  Filled 2013-07-23 (×4): qty 100

## 2013-07-23 MED ORDER — PANCRELIPASE (LIP-PROT-AMYL) 12000-38000 UNITS PO CPEP
1.0000 | ORAL_CAPSULE | Freq: Three times a day (TID) | ORAL | Status: DC
  Administered 2013-07-23 – 2013-07-25 (×6): 1 via ORAL
  Filled 2013-07-23 (×8): qty 1

## 2013-07-23 MED ORDER — POTASSIUM CHLORIDE CRYS ER 20 MEQ PO TBCR
30.0000 meq | EXTENDED_RELEASE_TABLET | ORAL | Status: AC
  Administered 2013-07-23 (×2): 30 meq via ORAL
  Filled 2013-07-23 (×2): qty 1

## 2013-07-23 MED ORDER — SODIUM CHLORIDE 0.9 % IV SOLN
INTRAVENOUS | Status: DC
  Administered 2013-07-23 – 2013-07-25 (×5): via INTRAVENOUS

## 2013-07-23 NOTE — Progress Notes (Signed)
CRITICAL VALUE ALERT  Critical value received:  Potassium 2.1  Date of notification:  11/30   Time of notification:  0638  Critical value read back:yes  Nurse who received alert:  Frederich Cha, RN  MD notified (1st page):  Burnadette Peter  Time of first page:  [phone]  Responding MD:  Burnadette Peter  Time MD responded:  [phone]

## 2013-07-23 NOTE — Progress Notes (Signed)
Related admission to Hospice diagnosis of chronic pancreatitis.  Patient is a full code.  Patient had repeated issues with hypoglycemia overnight and after repeated carbohydrate intake, CBG stabilized to 75.  Patient also had Potassium of 2.1 this morning which required intervention.  Continue current plan of monitoring and supportive care and encourage a call to Hospice at [phone] with any needs.  April Costella Hatcher, RN, BSN Hospice

## 2013-07-23 NOTE — Progress Notes (Addendum)
Hypoglycemic Event  CBG: 60, repeat CBG 64  Treatment: 15 GM carbohydrate snack  Symptoms: Shaky, nauseas  Follow-up CBG: Time: 0652 CBG Result:58  Treatment: 15 GM carbohydrate snack  Symptoms: shaky, nauseas  Follow-up CBG: Time: 0608 CBG Result: 75  Possible Reasons for Event: Unknown  Comments/MD notified:notified on-call MD Krista Blue, Kanon Novosel L  Remember to initiate Hypoglycemia Order Set & complete

## 2013-07-23 NOTE — Progress Notes (Signed)
[facility] PROGRESS NOTE  Mitchell Mccall WGN:[id number] DOB: June 06, 1951 DOA: 07/18/2013 PCP:  Duane Lope, MD  Assessment/Plan:  1.Metabolic Acidosis; he presents with increase anion gap metabolic acidosis. Gap at 38. Multifactorial DKA, acidosis from GI losses.  -Resolved. Gap close.   2-Nausea, vomiting, coffee ground emesis: Continue with  IV protonix. GI consulted. Hold plavix for now. Endoscopy show severe ulcerative esophagitis. Tolerating diet.   3-Transaminases: Abdominal US: Surgical absence of the gallbladder. Diffuse fatty infiltration of the liver. No specific acute abnormalities demonstrated. GI consulted. Trending down.   4-Diabetes, DKA: see # 1. Resolved.   5-Alcohol withdrawal; , last drink Saturday. Continue CIWA protocol.   6-Chronic pancreatitis. Might explain some component of abdominal pain. Abdominal pain better.   7-Diarrhea: C diff negative. Had multiple BM overnight. Received one dose of imodium which help. i will start pancreatic enzymes to see if this help.   8-DVT prophylaxis; SCD. Hold heparin due to GI bleed.   9-Hypoglycemia: Continue with  levemir to 5 units. Persist. Will discontinue SSI. He is very sensitive to insulin dose. Need to be cautious with insulin doses. I will continue with long acting insulin to avoid relapse of DKA.   10-Hypokalemia: severe. K at 2.1. Received oral and IV supplement. Repeat B-met. Check mg level.   12-Blurry vision, headache: MRI brain negative. Could be related to alcohol withdraw, episode of hypoglycemia.  Improving.   13-Acute blood loss; in setting GI bleed. Severe ulcerative esophagitis,  severe duodenitis with erosions without ulceration. Hb stable at 9.  14-Thrombocytopenia: improving. Setting of alcohol use.   Code Status: Patient wishes to be a Full Code.  Family Communication: care discussed with patient.  Disposition Plan: CM, SW consulted to help with Disposition. PT recommend SNF.     Consultants:  GI.   Procedures:  none  Antibiotics:  none  HPI/Subjective: Tolerating diet. Had multiple watery BM episodes last night. No more BM after imodium.    Objective: Filed Vitals:   07/23/13 1217  BP: 146/85  Pulse: 93  Temp: 98.2 F (36.8 C)  Resp: 18    Intake/Output Summary (Last 24 hours) at 07/23/13 1414 Last data filed at 07/23/13 1113  Gross per 24 hour  Intake   1220 ml  Output   2575 ml  Net  -1355 ml   Filed Weights   07/18/13 2105 07/22/13 1409  Weight: 45.7 kg (100 lb 12 oz) 51.7 kg (113 lb 15.7 oz)    Exam:   General:  No distress.   Cardiovascular: S 1, S 2 RRR  Respiratory: CTA  Abdomen: Bs present, NT, NR  Musculoskeletal: no edema  Data Reviewed: Basic Metabolic Panel:  Recent Labs Lab 07/19/13 1830 07/20/13 0540 07/20/13 1112 07/21/13 0600 07/23/13 0500  NA 132* 134* 131* 132* 132*  K 2.9* 2.7* 3.2* 3.4* 2.1*  CL 96 96 94* 96 93*  CO2 25 26 28 30  33*  GLUCOSE 115* 27* 195* 99 77  BUN 12 8 7  3* 5*  CREATININE 0.27* 0.24* 0.24* 0.25* 0.28*  CALCIUM 7.8* 8.2* 7.7* 7.6* 7.8*   Liver Function Tests:  Recent Labs Lab 07/18/13 1735 07/19/13 1025 07/21/13 0600  AST 264* 106* 85*  ALT 131* 78* 53  ALKPHOS 356* 231* 167*  BILITOT 1.9* 1.3* 0.6  PROT 8.0 6.3 5.0*  ALBUMIN 4.1 3.3* 2.5*    Recent Labs Lab 07/18/13 2121  LIPASE 10*   No results found for this basename: AMMONIA,  in the last 168 hours  CBC:  Recent Labs Lab 07/18/13 1735 07/18/13 2121  07/19/13 1624 07/20/13 0540 07/21/13 0600 07/22/13 0800 07/23/13 0500  WBC 10.8* 10.8*  --   --  14.5* 6.5 6.3 6.1  NEUTROABS 9.1*  --   --   --   --   --   --   --   HGB 13.8 11.7*  < > 10.6* 10.7* 8.7* 8.9* 9.0*  HCT 39.1 34.1*  < > 29.6* 29.3* 23.9* 24.7* 24.6*  MCV 104.0* 104.3*  --   --  98.7 98.0 98.4 97.6  PLT 203 179  --   --  136* 85* 92* 122*  < > = values in this interval not displayed. Cardiac Enzymes:  Recent Labs Lab  07/18/13 2121 07/19/13 0409 07/19/13 1025  TROPONINI <0.30 <0.30 <0.30   BNP (last 3 results) No results found for this basename: PROBNP,  in the last 8760 hours CBG:  Recent Labs Lab 07/23/13 0533 07/23/13 0552 07/23/13 0608 07/23/13 0748 07/23/13 1154  GLUCAP 64* 58* 75 165* 189*    Recent Results (from the past 240 hour(s))  MRSA PCR SCREENING     Status: None   Collection Time    07/18/13  9:21 PM      Result Value Range Status   MRSA by PCR NEGATIVE  NEGATIVE Final   Comment:            The GeneXpert MRSA Assay (FDA     approved for NASAL specimens     only), is one component of a     comprehensive MRSA colonization     surveillance program. It is not     intended to diagnose MRSA     infection nor to guide or     monitor treatment for     MRSA infections.  CULTURE, BLOOD (ROUTINE X 2)     Status: None   Collection Time    07/18/13 10:59 PM      Result Value Range Status   Specimen Description BLOOD LEFT FOREARM   Final   Special Requests BOTTLES DRAWN AEROBIC ONLY 4CC   Final   Culture  Setup Time     Final   Value: 07/19/2013 08:59     Performed at [facility]   Culture     Final   Value:        BLOOD CULTURE RECEIVED NO GROWTH TO DATE CULTURE WILL BE HELD FOR 5 DAYS BEFORE ISSUING A FINAL NEGATIVE REPORT     Performed at [facility]   Report Status PENDING   Incomplete  CULTURE, BLOOD (ROUTINE X 2)     Status: None   Collection Time    07/18/13 11:08 PM      Result Value Range Status   Specimen Description BLOOD LEFT HAND   Final   Special Requests BOTTLES DRAWN AEROBIC ONLY 2CC   Final   Culture  Setup Time     Final   Value: 07/19/2013 08:58     Performed at [facility]   Culture     Final   Value:        BLOOD CULTURE RECEIVED NO GROWTH TO DATE CULTURE WILL BE HELD FOR 5 DAYS BEFORE ISSUING A FINAL NEGATIVE REPORT     Performed at [facility]   Report Status PENDING   Incomplete  STOOL CULTURE     Status:  None   Collection Time    07/19/13  9:59 PM  Result Value Range Status   Specimen Description STOOL   Final   Special Requests NONE   Final   Culture     Final   Value: NO SUSPICIOUS COLONIES, CONTINUING TO HOLD     Performed at [facility]   Report Status PENDING   Incomplete  CLOSTRIDIUM DIFFICILE BY PCR     Status: None   Collection Time    07/19/13  9:59 PM      Result Value Range Status   C difficile by pcr NEGATIVE  NEGATIVE Final  FUNGUS CULTURE W SMEAR     Status: None   Collection Time    07/21/13 10:33 AM      Result Value Range Status   Specimen Description ESOPHAGUS   Final   Special Requests ESOPHAGUS BRUSHING   Final   Fungal Smear     Final   Value: NO YEAST OR FUNGAL ELEMENTS SEEN     Performed at [facility]   Culture     Final   Value: CULTURE IN PROGRESS FOR FOUR WEEKS     Performed at [facility]   Report Status PENDING   Incomplete  FUNGUS CULTURE W SMEAR     Status: None   Collection Time    07/21/13 10:33 AM      Result Value Range Status   Specimen Description ESOPHAGUS   Final   Special Requests ESOPHAGUS BIOPSY   Final   Fungal Smear     Final   Value: NO YEAST OR FUNGAL ELEMENTS SEEN     Performed at [facility]   Culture     Final   Value: CULTURE IN PROGRESS FOR FOUR WEEKS     Performed at [facility]   Report Status PENDING   Incomplete     Studies: Mr Brain Wo Contrast  07/21/2013   CLINICAL DATA:  Double vision, headaches  EXAM: MRI HEAD WITHOUT CONTRAST  TECHNIQUE: Multiplanar, multiecho pulse sequences of the brain and surrounding structures were obtained without intravenous contrast.  COMPARISON:  Prior CT from 05/01/2010 and MRI from 05/15/2009.  FINDINGS: Study is somewhat degraded by motion artifact.  Scattered and confluent foci of hyperintense T2/FLAIR signal within the periventricular and deep white matter of both frontal lobes are seen, nonspecific, but likely related to chronic  small vessel ischemic changes. Similar changes are seen within the pons. Overall, these findings are not significantly changed relative to prior MRI from 05/15/2009.  No diffusion-weighted signal abnormality to suggest acute intracranial infarct is identified. Normal flow voids are seen within the intracranial vasculature. No intracranial hemorrhage.  No mass lesion, midline shift, or localized mass effect. Mild prominence of the CSF containing spaces is compatible with generalized age-related atrophy. Ventricles are normal in size without evidence of hydrocephalus. There is no extra-axial fluid collection.  The craniocervical junction is within normal limits. Incidental note is made of a partially empty sella. Orbital soft tissues, optic nerves, and optic chiasm are within normal limits.  The calvarium demonstrates a normal appearance with normal signal intensity.  Paranasal sinuses and mastoid air cells are well pneumatized and free of fluid.  IMPRESSION: 1. No acute intracranial abnormality identified. 2. Mild atrophy and chronic microvascular ischemic disease, similar as compared to prior MRI from 05/15/2009.   Electronically Signed   By: Rise Mu M.D.   On: 07/21/2013 23:20    Scheduled Meds: . DULoxetine  30 mg Oral BID  . folic acid  1  mg Oral Daily  . gabapentin  300 mg Oral TID  . insulin detemir  5 Units Subcutaneous QHS  . lipase/protease/amylase  1 capsule Oral TID AC  . multivitamin with minerals  1 tablet Oral Daily  . nicotine  14 mg Transdermal Daily  . pantoprazole (PROTONIX) IV  40 mg Intravenous Q12H  . sodium chloride  10-40 mL Intracatheter Q12H  . sodium chloride  3 mL Intravenous Q12H  . sucralfate  1 g Oral TID WC & HS  . thiamine  100 mg Oral Daily   Continuous Infusions: . sodium chloride 75 mL/hr at 07/23/13 0743    Principal Problem:   DKA, type 2 Active Problems:   PVD RCIA PTA 11/12, LCIA PTA  Jan 2013 - patent left CIA stent, right CIA stenosis via  PV angio 06/01/13   Hypertension   Pancreatic pseudocyst   DM (diabetes mellitus), type 2, uncontrolled with complications   Chronic alcohol dependence, continuous   Hypokalemia   Metabolic acidosis   Diarrhea   Intravascular volume depletion   Protein-calorie malnutrition, severe    Time spent: 35 minutes.     Mitchell Mccall,Mitchell Mccall  [facility] Pager [phone]. If 7PM-7AM, please contact night-coverage at [email or web address], password [facility] 07/23/2013, 2:14 PM  LOS: 5 days

## 2013-07-23 NOTE — Progress Notes (Signed)
PT Cancellation Note  Patient Details Name: Mitchell Mccall MRN: [id number] DOB: 1951-04-10   Cancelled Treatment:    Reason Eval/Treat Not Completed: Fatigue/lethargy limiting ability to participate;Medical issues which prohibited therapy.  Patient states he was up all night with diarrhea and needs to sleep.  Patient declined PT services today.  Will return tomorrow.   Vena Austria 07/23/2013, 2:39 PM Durenda Hurt. Renaldo Fiddler, [facility] Acute Rehab Services Pager [phone]

## 2013-07-23 NOTE — Progress Notes (Signed)
Pt called RN to room. Stated he felt "funny". When asked to describe his feelings further he stated "my thoughts feel all squirrely and my skin feels weird." RN asked if he felt like something was crawling on him, he denied. Stated that his skin felt numb all over. VS were checked and stable, CBG was high but normal for the patient at this time of day, CIWA was completed and pt scored a 7. Pt complained of anxiety and was given PRN Ativan 1mg  PO. Pt seemed more confused than usual. RN asked orientation questions to which pt was able to answer, and was found to be alert and oriented times 4. Pt did complain of some confusion, not about why he is in the hospital but about events going on now. He asked RN if he was being a bother, and if that's why she "pulled that trick just now." When asked to explain further pt stated he "felt confused". Will continue to monitor.

## 2013-07-23 NOTE — Progress Notes (Signed)
CBG 60. Repeat CBG 64. On-call MD paged to notify, patient given milk and graham crackers. Will recheck in 15 min. Will continue to monitor.

## 2013-07-24 ENCOUNTER — Encounter (HOSPITAL_COMMUNITY): Payer: Self-pay | Admitting: Gastroenterology

## 2013-07-24 DIAGNOSIS — F1020 Alcohol dependence, uncomplicated: Secondary | ICD-10-CM

## 2013-07-24 DIAGNOSIS — E1310 Other specified diabetes mellitus with ketoacidosis without coma: Secondary | ICD-10-CM

## 2013-07-24 DIAGNOSIS — E872 Acidosis: Secondary | ICD-10-CM

## 2013-07-24 DIAGNOSIS — E1165 Type 2 diabetes mellitus with hyperglycemia: Secondary | ICD-10-CM

## 2013-07-24 DIAGNOSIS — E118 Type 2 diabetes mellitus with unspecified complications: Secondary | ICD-10-CM

## 2013-07-24 DIAGNOSIS — R197 Diarrhea, unspecified: Secondary | ICD-10-CM

## 2013-07-24 LAB — STOOL CULTURE

## 2013-07-24 LAB — BASIC METABOLIC PANEL
BUN: 7 mg/dL (ref 6–23)
Calcium: 7.5 mg/dL — ABNORMAL LOW (ref 8.4–10.5)
Chloride: 100 mEq/L (ref 96–112)
Creatinine, Ser: 0.27 mg/dL — ABNORMAL LOW (ref 0.50–1.35)
GFR calc Af Amer: >90 mL/min (ref >90)
GFR calc non Af Amer: >90 mL/min (ref >90)
Potassium: 2.9 mEq/L — ABNORMAL LOW (ref 3.5–5.1)

## 2013-07-24 LAB — GLUCOSE, CAPILLARY
Glucose-Capillary: 133 mg/dL — ABNORMAL HIGH (ref 70–99)
Glucose-Capillary: 167 mg/dL — ABNORMAL HIGH (ref 70–99)
Glucose-Capillary: 282 mg/dL — ABNORMAL HIGH (ref 70–99)
Glucose-Capillary: 307 mg/dL — ABNORMAL HIGH (ref 70–99)
Glucose-Capillary: 314 mg/dL — ABNORMAL HIGH (ref 70–99)
Glucose-Capillary: 51 mg/dL — ABNORMAL LOW (ref 70–99)
Glucose-Capillary: 82 mg/dL (ref 70–99)

## 2013-07-24 LAB — CBC
HCT: 23.4 % — ABNORMAL LOW (ref 39.0–52.0)
MCHC: 36.8 g/dL — ABNORMAL HIGH (ref 30.0–36.0)
Platelets: 166 10*3/uL (ref 150–400)
RDW: 13.7 % (ref 11.5–15.5)

## 2013-07-24 MED ORDER — POTASSIUM CHLORIDE 10 MEQ/100ML IV SOLN
10.0000 meq | INTRAVENOUS | Status: AC
  Administered 2013-07-24 (×2): 10 meq via INTRAVENOUS
  Filled 2013-07-24 (×2): qty 100

## 2013-07-24 MED ORDER — MAGNESIUM SULFATE 40 MG/ML IJ SOLN
2.0000 g | Freq: Once | INTRAMUSCULAR | Status: AC
  Administered 2013-07-24: 17:00:00 2 g via INTRAVENOUS
  Filled 2013-07-24: qty 50

## 2013-07-24 MED ORDER — INSULIN DETEMIR 100 UNIT/ML ~~LOC~~ SOLN
2.0000 [IU] | Freq: Every day | SUBCUTANEOUS | Status: DC
  Administered 2013-07-24: 2 [IU] via SUBCUTANEOUS
  Filled 2013-07-24 (×2): qty 0.02

## 2013-07-24 MED ORDER — POTASSIUM CHLORIDE CRYS ER 20 MEQ PO TBCR
40.0000 meq | EXTENDED_RELEASE_TABLET | Freq: Two times a day (BID) | ORAL | Status: AC
  Administered 2013-07-24 (×2): 40 meq via ORAL
  Filled 2013-07-24 (×2): qty 2

## 2013-07-24 NOTE — Progress Notes (Signed)
Hypoglycemic Event  CBG: 51  Treatment: 15 GM carbohydrate snack  Symptoms: Shaky, Hungry, Nervous/irritable and Vision changes  Follow-up CBG: Time:0802 CBG Result:82  Possible Reasons for Event: Unknown  Comments/MD notified:Text paged Mitchell Mccall at 7:49    Mitchell Mccall  Remember to initiate Hypoglycemia Order Set & complete

## 2013-07-24 NOTE — Clinical Social Work Placement (Signed)
Clinical Social Work Department CLINICAL SOCIAL WORK PLACEMENT NOTE 07/24/2013  Patient:  FIN, [facility]PP  Account Number:  [id number] Admit date:  07/18/2013  Clinical Social Worker:  Lavell Luster  Date/time:  07/24/2013 11:00 AM  Clinical Social Work is seeking post-discharge placement for this patient at the following level of care:   SKILLED NURSING   (*CSW will update this form in Epic as items are completed)   07/24/2013  Patient/family provided with [facility] Department of Clinical Social Work's list of facilities offering this level of care within the geographic area requested by the patient (or if unable, by the patient's family).  07/24/2013  Patient/family informed of their freedom to choose among providers that offer the needed level of care, that participate in Medicare, Medicaid or managed care program needed by the patient, have an available bed and are willing to accept the patient.  07/24/2013  Patient/family informed of [facility]' ownership interest in [facility], as well as of the fact that they are under no obligation to receive care at this facility.  PASARR submitted to EDS on 07/24/2013 PASARR number received from EDS on 07/24/2013  FL2 transmitted to all facilities in geographic area requested by pt/family on  07/24/2013 FL2 transmitted to all facilities within larger geographic area on   Patient informed that his/her managed care company has contracts with or will negotiate with  certain facilities, including the following:     Patient/family informed of bed offers received:   Patient chooses bed at  Physician recommends and patient chooses bed at    Patient to be transferred to  on   Patient to be transferred to facility by   The following physician request were entered in Epic:   Additional Comments:   Roddie Mc, [place], [place], [phone]

## 2013-07-24 NOTE — Evaluation (Signed)
Occupational Therapy Evaluation Patient Details Name: Mitchell Mccall MRN: [id number] DOB: [birth year] Today's Date: 07/24/2013 Time: 8295-6213 OT Time Calculation (min): 28 min  OT Assessment / Plan / Recommendation History of present illness Pt adm with N/V and abd pain.  Pt with DKA due to metabolic acidosis. Pt with history of alcohol abuse.   Clinical Impression   Pt admitted w/ dx as above, presenting w/ gereralized weakness and significant deficits as listed below in OT problem section. Strongly recommend SNF Rehab however, pt currently declining. He will need 24/assist and HHOT if he does go home. Will follow acutely to assist in maximizing independence w/ ADL's prior to discharge.    OT Assessment  Patient needs continued OT Services    Follow Up Recommendations  SNF;Other (comment) (Will need 24/hr assist & HHOT if declines SNF Rehab)    Barriers to Discharge  Lives alone, wife works and lives in different house 1.5 miles away, per pt report.    Equipment Recommendations  Other (comment) (Defer to next venue)    Recommendations for Other Services    Frequency  Min 2X/week    Precautions / Restrictions Precautions Precautions: Fall   Pertinent Vitals/Pain No, denies pain    ADL  Eating/Feeding: Performed;Modified independent Where Assessed - Eating/Feeding: Bed level Grooming: Performed;Wash/dry hands;Wash/dry face;Teeth care;Set up;Supervision/safety Where Assessed - Grooming: Unsupported sitting Upper Body Bathing: Simulated;Set up;Min guard Where Assessed - Upper Body Bathing: Unsupported sitting Lower Body Bathing: Simulated;Minimal assistance;Moderate assistance Where Assessed - Lower Body Bathing: Supported sit to stand Upper Body Dressing: Simulated;Set up Where Assessed - Upper Body Dressing: Unsupported sitting Lower Body Dressing: Simulated;Moderate assistance Where Assessed - Lower Body Dressing: Supported sit to Pharmacist, hospital: Simulated;Moderate  assistance (Sit -to stand from EOB for peri care/LB ADL's) Toilet Transfer Method: Sit to stand Toilet Transfer Equipment: Bedside commode Toileting - Clothing Manipulation and Hygiene: Performed;Minimal assistance Where Assessed - Engineer, mining and Hygiene: Standing Tub/Shower Transfer Method: Not assessed Equipment Used: Gait belt;Rolling walker Transfers/Ambulation Related to ADLs: Pt is overall Min-Mod assist for sit to stand w/ UE assist & RW from EOB. VC's for safety, sequencing and hand placement. ADL Comments: Pt was educated in role of OT and recommendations for SNF Rehab secondary to not having 24/assist at this time. Pt currently declining and states that he feels as though Hospice will be "enough" for him. Pt appears confused at times and will need 24hr supervision/assist & HHOT if he goes home. Note: pt reports having two houses, he lives in one & wife (whom works) lives in another due tthe fact htat he is a smoker.     OT Diagnosis: Generalized weakness  OT Problem List: Decreased strength;Decreased activity tolerance;Impaired balance (sitting and/or standing);Decreased knowledge of use of DME or AE;Decreased safety awareness OT Treatment Interventions: Self-care/ADL training;DME and/or AE instruction;Energy conservation;Patient/family education;Therapeutic activities;Balance training;Therapeutic exercise   OT Goals(Current goals can be found in the care plan section) Acute Rehab OT Goals Patient Stated Goal: Go home Time For Goal Achievement: 08/07/13 Potential to Achieve Goals: Good  Visit Information  Last OT Received On: 07/24/13 Assistance Needed: +1 History of Present Illness: Pt adm with N/V and abd pain.  Pt with DKA due to metabolic acidosis. Pt with history of alcohol abuse.       Prior Functioning     Home Living Family/patient expects to be discharged to:: Private residence Living Arrangements: Spouse/significant other;Children Available  Help at Discharge: Available PRN/intermittently Type of Home: House Home Access:  Level entry Home Layout: One level Home Equipment: Cane - single point;Bedside commode;Shower seat;Hand held Careers information officer - 2 wheels Prior Function Level of Independence: Independent with assistive device(s) Comments: Pt reports that he has two houses, one where his wife lives (he is a smoker and she is not) and he does not have 24/assist available at this time per his report. Communication Communication: No difficulties Dominant Hand: Right    Vision/Perception Vision - History Baseline Vision: Wears glasses all the time Visual History: Cataracts (Left eye) Patient Visual Report: No change from baseline   Cognition  Cognition Arousal/Alertness: Awake/alert Behavior During Therapy: WFL for tasks assessed/performed    Extremity/Trunk Assessment Upper Extremity Assessment Upper Extremity Assessment: Generalized weakness Lower Extremity Assessment Lower Extremity Assessment: Generalized weakness     Mobility Bed Mobility Bed Mobility: Supine to Sit;Sitting - Scoot to Edge of Bed;Sit to Supine Supine to Sit: 5: Supervision;4: Min guard;HOB elevated;With rails Sitting - Scoot to Edge of Bed: 5: Supervision;With rail Sit to Supine: 5: Supervision;With rail;HOB flat Transfers Transfers: Sit to Stand;Stand to Sit Sit to Stand: 3: Mod assist;With upper extremity assist;From bed Stand to Sit: 4: Min assist;To bed;With upper extremity assist;With armrests Details for Transfer Assistance: Pt requires assist for safety, ahnd placement and sequencing w/ RW from EOB.        Balance Balance Balance Assessed: Yes Static Sitting Balance Static Sitting - Balance Support: No upper extremity supported;Feet supported Static Standing Balance Static Standing - Balance Support: Bilateral upper extremity supported   End of Session OT - End of Session Equipment Utilized During Treatment: Rolling walker;Gait  belt Activity Tolerance: Patient limited by fatigue Patient left: in bed;with call bell/phone within reach Nurse Communication: Mobility status  GO     Alm Bustard 07/24/2013, 9:34 AM

## 2013-07-24 NOTE — Progress Notes (Signed)
Inpatient Diabetes Program Recommendations  AACE/ADA: New Consensus Statement on Inpatient Glycemic Control (2013)  Target Ranges:  Prepandial:   less than 140 mg/dL      Peak postprandial:   less than 180 mg/dL (1-2 hours)      Critically ill patients:  140 - 180 mg/dL   Results for Mitchell Mccall, VO (MRN [id number]) as of 07/24/2013 11:22  Ref. Range 07/23/2013 11:54 07/23/2013 16:38 07/23/2013 21:37 07/24/2013 00:39 07/24/2013 03:56 07/24/2013 07:38 07/24/2013 08:02  Glucose-Capillary Latest Range: 70-99 mg/dL 295 (H) 284 (H) 132 (H) 260 (H) 133 (H) 51 (L) 82    Inpatient Diabetes Program Recommendations Insulin - Basal: Please consider discontinuing Levemir. Correction (SSI): Please order Novolog sensitive correction Q4H or ACHS (if patient is eating well). HgbA1C: Please consider ordering an A1C to determine glycemic control over the past 2-3 months.  Last A1C in the chart was 8.0% on 05/01/2010.  Note: Currently, patient ordered to receive Levemir 5 units QHS and CBGs Q4H for inpatient glycemic control.  Noted hypoglycemia with fasting glucose over the past 3 mornings (on Levemir 5 units QHS).  Please consider discontinuing Levemir and ordering Novolog sensitive correction scale Q4H (or ACHS if patient is eating well), and ordering an A1C.  Will continue to follow.  Thanks, Orlando Penner, RN, MSN, CCRN Diabetes Coordinator Inpatient Diabetes Program [phone] (Team Pager) [phone] ([facility] office) [phone] [facility] office)

## 2013-07-24 NOTE — Clinical Social Work Psychosocial (Signed)
Clinical Social Work Department BRIEF PSYCHOSOCIAL ASSESSMENT 07/24/2013  Patient:  Mitchell Mccall, Mitchell Mccall     Account Number:  [id number]     Admit date:  07/18/2013  Clinical Social Worker:  Lavell Luster  Date/Time:  07/24/2013 10:45 AM  Referred by:  Physician  Date Referred:  07/24/2013 Referred for  SNF Placement   Other Referral:   Interview type:  Patient Other interview type:   Patient alert and oriented at time of assessment.    PSYCHOSOCIAL DATA Living Status:  WIFE Admitted from facility:   Level of care:   Primary support name:  Caswell Alvillar  [phone]   [phone] Primary support relationship to patient:  SPOUSE Degree of support available:   Support is strong.    CURRENT CONCERNS Current Concerns  Post-Acute Placement   Other Concerns:    SOCIAL WORK ASSESSMENT / PLAN Patient is currently a hospice care patient admitted from home with wife. CSW met with patient to discuss SNF recommendation. Patient was refusing SNF placement per PT, but patient is now agreeable as he will not have 24/7 supervision at home. Patient plans to return home with hospice after SNF stay.   Assessment/plan status:  Psychosocial Support/Ongoing Assessment of Needs Other assessment/ plan:   Complete FL2, Fax, PASRR   Information/referral to community resources:   SNF list and CSW contact information given to patient.    PATIENT'S/FAMILY'S RESPONSE TO PLAN OF CARE: Patient is agreeable to SNF placement. Patient was pleasant and appreciative of CSw contact. Patient awaits bed offers. CSW will continue to follow.    Roddie Mc, [place], [place], [phone]

## 2013-07-24 NOTE — Progress Notes (Signed)
[facility] PROGRESS NOTE  NALU TROUBLEFIELD ZOX:[id number] DOB: 02-18-1951 DOA: 07/18/2013 PCP:  Duane Lope, MD  Assessment/Plan:  1.Metabolic Acidosis; he presents with increase anion gap metabolic acidosis. Gap at 38. Multifactorial DKA, acidosis from GI losses.  -Resolved. Gap close.   2-Severe ulcerative esophagitis; Nausea, vomiting, coffee ground emesis: Continue with protonix. GI consulted. Hold plavix for now. Endoscopy show severe ulcerative esophagitis. Tolerating diet.   3-Transaminases: Abdominal US: Surgical absence of the gallbladder. Diffuse fatty infiltration of the liver. No specific acute abnormalities demonstrated. GI consulted. Trending down.   4-Diabetes, DKA: see # 1. Resolved.   5-Alcohol withdrawal; . Continue CIWA protocol.   6-Chronic pancreatitis. Might explain some component of abdominal pain. Abdominal pain better.   7-Diarrhea: C diff negative. Continue with  pancreatic enzymes. Had 4 BM yesterday. One BM today so far.   8-DVT prophylaxis; SCD. Hold heparin due to GI bleed.   9-Hypoglycemia: Discontinue SSI due to hypoglycemia.  He is very sensitive to insulin doses. Need to be cautious with insulin doses. I will continue with long acting insulin to avoid relapse of DKA. Will decrease levemir to 2 units from 5 units.   10-Hypokalemia:  Replete with 40 meq times 2. 2 runs IV K-CL.   12-Blurry vision, headache: MRI brain negative. Could be related to alcohol withdraw, episode of hypoglycemia.  Improving.   13-Acute blood loss; in setting GI bleed. Severe ulcerative esophagitis,  severe duodenitis with erosions without ulceration. 14-Thrombocytopenia: improving. Setting of alcohol use.   Code Status: Patient wishes to be a Full Code.  Family Communication: care discussed with patient.  Disposition Plan: CM, SW consulted to help with Disposition. PT recommend SNF.    Consultants:  GI.    Procedures:  none  Antibiotics:  none  HPI/Subjective: Tolerating diet. Had 4 BM yesterday. Blood sugar this am decrease to 59.    Objective: Filed Vitals:   07/24/13 1313  BP: 130/81  Pulse: 99  Temp: 98.6 F (37 C)  Resp: 18    Intake/Output Summary (Last 24 hours) at 07/24/13 1359 Last data filed at 07/24/13 1314  Gross per 24 hour  Intake   1135 ml  Output   4950 ml  Net  -3815 ml   Filed Weights   07/18/13 2105 07/22/13 1409  Weight: 45.7 kg (100 lb 12 oz) 51.7 kg (113 lb 15.7 oz)    Exam:   General:  No distress.   Cardiovascular: S 1, S 2 RRR  Respiratory: CTA  Abdomen: Bs present, NT, NR  Musculoskeletal: no edema  Data Reviewed: Basic Metabolic Panel:  Recent Labs Lab 07/20/13 1112 07/21/13 0600 07/23/13 0500 07/23/13 1600 07/24/13 0500  NA 131* 132* 132* 126* 135  K 3.2* 3.4* 2.1* 3.5 2.9*  CL 94* 96 93* 92* 100  CO2 28 30 33* 28 31  GLUCOSE 195* 99 77 273* 85  BUN 7 3* 5* 6 7  CREATININE 0.24* 0.25* 0.28* 0.37* 0.27*  CALCIUM 7.7* 7.6* 7.8* 7.2* 7.5*  MG  --   --   --  1.3*  --    Liver Function Tests:  Recent Labs Lab 07/18/13 1735 07/19/13 1025 07/21/13 0600  AST 264* 106* 85*  ALT 131* 78* 53  ALKPHOS 356* 231* 167*  BILITOT 1.9* 1.3* 0.6  PROT 8.0 6.3 5.0*  ALBUMIN 4.1 3.3* 2.5*    Recent Labs Lab 07/18/13 2121  LIPASE 10*   No results found for this basename: AMMONIA,  in the last 168  hours CBC:  Recent Labs Lab 07/18/13 1735  07/20/13 0540 07/21/13 0600 07/22/13 0800 07/23/13 0500 07/24/13 0500  WBC 10.8*  < > 14.5* 6.5 6.3 6.1 6.4  NEUTROABS 9.1*  --   --   --   --   --   --   HGB 13.8  < > 10.7* 8.7* 8.9* 9.0* 8.6*  HCT 39.1  < > 29.3* 23.9* 24.7* 24.6* 23.4*  MCV 104.0*  < > 98.7 98.0 98.4 97.6 98.3  PLT 203  < > 136* 85* 92* 122* 166  < > = values in this interval not displayed. Cardiac Enzymes:  Recent Labs Lab 07/18/13 2121 07/19/13 0409 07/19/13 1025  TROPONINI <0.30 <0.30 <0.30    BNP (last 3 results) No results found for this basename: PROBNP,  in the last 8760 hours CBG:  Recent Labs Lab 07/24/13 0039 07/24/13 0356 07/24/13 0738 07/24/13 0802 07/24/13 1152  GLUCAP 260* 133* 51* 82 167*    Recent Results (from the past 240 hour(s))  MRSA PCR SCREENING     Status: None   Collection Time    07/18/13  9:21 PM      Result Value Range Status   MRSA by PCR NEGATIVE  NEGATIVE Final   Comment:            The GeneXpert MRSA Assay (FDA     approved for NASAL specimens     only), is one component of a     comprehensive MRSA colonization     surveillance program. It is not     intended to diagnose MRSA     infection nor to guide or     monitor treatment for     MRSA infections.  CULTURE, BLOOD (ROUTINE X 2)     Status: None   Collection Time    07/18/13 10:59 PM      Result Value Range Status   Specimen Description BLOOD LEFT FOREARM   Final   Special Requests BOTTLES DRAWN AEROBIC ONLY 4CC   Final   Culture  Setup Time     Final   Value: 07/19/2013 08:59     Performed at [facility]   Culture     Final   Value:        BLOOD CULTURE RECEIVED NO GROWTH TO DATE CULTURE WILL BE HELD FOR 5 DAYS BEFORE ISSUING A FINAL NEGATIVE REPORT     Performed at [facility]   Report Status PENDING   Incomplete  CULTURE, BLOOD (ROUTINE X 2)     Status: None   Collection Time    07/18/13 11:08 PM      Result Value Range Status   Specimen Description BLOOD LEFT HAND   Final   Special Requests BOTTLES DRAWN AEROBIC ONLY 2CC   Final   Culture  Setup Time     Final   Value: 07/19/2013 08:58     Performed at [facility]   Culture     Final   Value:        BLOOD CULTURE RECEIVED NO GROWTH TO DATE CULTURE WILL BE HELD FOR 5 DAYS BEFORE ISSUING A FINAL NEGATIVE REPORT     Performed at [facility]   Report Status PENDING   Incomplete  STOOL CULTURE     Status: None   Collection Time    07/19/13  9:59 PM      Result Value Range Status    Specimen Description STOOL   Final  Special Requests NONE   Final   Culture     Final   Value: NO SALMONELLA, SHIGELLA, CAMPYLOBACTER, YERSINIA, OR E.COLI 0157:H7 ISOLATED     Performed at [facility]   Report Status 07/24/2013 FINAL   Final  CLOSTRIDIUM DIFFICILE BY PCR     Status: None   Collection Time    07/19/13  9:59 PM      Result Value Range Status   C difficile by pcr NEGATIVE  NEGATIVE Final  FUNGUS CULTURE W SMEAR     Status: None   Collection Time    07/21/13 10:33 AM      Result Value Range Status   Specimen Description ESOPHAGUS   Final   Special Requests ESOPHAGUS BRUSHING   Final   Fungal Smear     Final   Value: NO YEAST OR FUNGAL ELEMENTS SEEN     Performed at [facility]   Culture     Final   Value: CULTURE IN PROGRESS FOR FOUR WEEKS     Performed at [facility]   Report Status PENDING   Incomplete  FUNGUS CULTURE W SMEAR     Status: None   Collection Time    07/21/13 10:33 AM      Result Value Range Status   Specimen Description ESOPHAGUS   Final   Special Requests ESOPHAGUS BIOPSY   Final   Fungal Smear     Final   Value: NO YEAST OR FUNGAL ELEMENTS SEEN     Performed at [facility]   Culture     Final   Value: CULTURE IN PROGRESS FOR FOUR WEEKS     Performed at [facility]   Report Status PENDING   Incomplete     Studies: No results found.  Scheduled Meds: . DULoxetine  30 mg Oral BID  . folic acid  1 mg Oral Daily  . gabapentin  300 mg Oral TID  . insulin detemir  5 Units Subcutaneous QHS  . lipase/protease/amylase  1 capsule Oral TID AC  . multivitamin with minerals  1 tablet Oral Daily  . nicotine  14 mg Transdermal Daily  . pantoprazole (PROTONIX) IV  40 mg Intravenous Q12H  . potassium chloride  40 mEq Oral BID WC  . sodium chloride  10-40 mL Intracatheter Q12H  . sodium chloride  3 mL Intravenous Q12H  . sucralfate  1 g Oral TID WC & HS  . thiamine  100 mg Oral Daily   Continuous  Infusions: . sodium chloride 75 mL/hr at 07/24/13 5784    Principal Problem:   DKA, type 2 Active Problems:   PVD RCIA PTA 11/12, LCIA PTA  Jan 2013 - patent left CIA stent, right CIA stenosis via PV angio 06/01/13   Hypertension   Pancreatic pseudocyst   DM (diabetes mellitus), type 2, uncontrolled with complications   Chronic alcohol dependence, continuous   Hypokalemia   Metabolic acidosis   Diarrhea   Intravascular volume depletion   Protein-calorie malnutrition, severe    Time spent: 30 minutes.     Anwar Sakata  [facility] Pager [phone]. If 7PM-7AM, please contact night-coverage at [email or web address], password [facility] 07/24/2013, 1:59 PM  LOS: 6 days

## 2013-07-24 NOTE — Progress Notes (Signed)
Room 5W 29 - Mitchell Mccall - [facility] RN Visit-R.Addysen Louth RN  Related admission to [facility] diagnosis of chronic pancreatitis.   Pt is FULL code per pt's wishes.    Pt alert & oriented, lying in bed in R/sided fetal position, with complaints of pain in the abdomen at 2/10 and periodic nausea but both symptoms are much improved. No family present.  Hospital SW Beverely Pace present discussing disposition options-pt initially states he wants to go home but unclear on how he can care for himself or have assistance in the home - discussed rehab at [facility] and [facility] can return once pt gets stronger and is back at home.  Patient's home medication list is on shadow chart.   Please call [facility] @ [phone]- ask for RN Liaison or after hours,ask for on-call RN with any hospice needs.   Thank you.  Joneen Boers, RN  [facility]  Hospice Liaison  [phone])

## 2013-07-24 NOTE — Progress Notes (Signed)
Physical Therapy Treatment Patient Details Name: Mitchell Mccall MRN: [id number] DOB: 01/11/1951 Today's Date: 07/24/2013 Time: 7829-5621 PT Time Calculation (min): 40 min  PT Assessment / Plan / Recommendation  History of Present Illness Pt adm with N/V and abd pain.  Pt with DKA due to metabolic acidosis. Pt with history of alcohol abuse.   PT Comments   Pt progressing towards physical therapy goals. Pt states that he feels better every day but continues to be a long way from his PLOF. PT continues to recommend short term rehab at a SNF at d/c, due to weakness, decreased tolerance for functional activity, and balance deficits.  Follow Up Recommendations  SNF (Will need 24 hour assist at home if refuses SNF at d/c)     Does the patient have the potential to tolerate intense rehabilitation     Barriers to Discharge        Equipment Recommendations  None recommended by PT    Recommendations for Other Services    Frequency Min 3X/week   Progress towards PT Goals Progress towards PT goals: Progressing toward goals  Plan Current plan remains appropriate    Precautions / Restrictions Precautions Precautions: Fall Restrictions Weight Bearing Restrictions: No   Pertinent Vitals/Pain Pt reports L knee pain and R hip pain during exercise and states the nurses are aware of this.     Mobility  Bed Mobility Bed Mobility: Supine to Sit;Sitting - Scoot to Edge of Bed;Scooting to HOB Supine to Sit: 4: Min guard;HOB flat;With rails Sitting - Scoot to Edge of Bed: 5: Supervision Scooting to [facility]: 4: Min assist Details for Bed Mobility Assistance: Pt reports he needs assist to sit up all the way, however with min guard he was able to transition to EOB with use of bed rails. Bed pad used to assist pt in scooting to [facility]. Transfers Transfers: Sit to Stand;Stand to Sit Sit to Stand: 4: Min assist;From bed;With upper extremity assist Stand to Sit: 4: Min assist;To chair/3-in-1;With upper extremity  assist Details for Transfer Assistance: VC's for hand placement and safety awareness with the RW. Pt without use of walker needed extra time to complete transfer to full stand as well as regain balance. Ambulation/Gait Ambulation/Gait Assistance: 4: Min assist Ambulation Distance (Feet): 225 Feet (175 feet without walker, 50 feet with walker) Assistive device: Rolling walker;None Ambulation/Gait Assistance Details: Pt ambulated with and without RW. He demonstrated 3 balance disturbances and required min to mod assist to recover, however he states he feels better without the walker. VC's for improved posture. Gait Pattern: Step-through pattern;Decreased stride length;Shuffle;Trunk flexed Gait velocity: Decreased Wheelchair Mobility Wheelchair Mobility: No    Exercises General Exercises - Lower Extremity Heel Slides: 15 reps Hip ABduction/ADduction: 15 reps Straight Leg Raises: 15 reps   PT Diagnosis:    PT Problem List:   PT Treatment Interventions:     PT Goals (current goals can now be found in the care plan section) Acute Rehab PT Goals Patient Stated Goal: Go home PT Goal Formulation: With patient Time For Goal Achievement: 07/22/13 Potential to Achieve Goals: Good  Visit Information  Last PT Received On: 07/24/13 Assistance Needed: +1 History of Present Illness: Pt adm with N/V and abd pain.  Pt with DKA due to metabolic acidosis. Pt with history of alcohol abuse.    Subjective Data  Subjective: "I don't want anything to stand in the way of my therapy." Patient Stated Goal: Go home   Cognition  Cognition Arousal/Alertness: Awake/alert Behavior During  Therapy: WFL for tasks assessed/performed Overall Cognitive Status: Within Functional Limits for tasks assessed    Balance  Balance Balance Assessed: Yes Static Sitting Balance Static Sitting - Balance Support: Feet supported;No upper extremity supported Static Sitting - Level of Assistance: 5: Stand by  assistance Static Standing Balance Static Standing - Balance Support: No upper extremity supported Static Standing - Level of Assistance: 4: Min assist Dynamic Standing Balance Dynamic Standing - Balance Support: No upper extremity supported Dynamic Standing - Level of Assistance: 4: Min assist;3: Mod assist  End of Session PT - End of Session Equipment Utilized During Treatment: Gait belt Activity Tolerance: Patient tolerated treatment well Patient left: in chair;with call bell/phone within reach Nurse Communication: Mobility status   GP     Ruthann Cancer 07/24/2013, 4:40 PM  Ruthann Cancer, PT, DPT [phone]

## 2013-07-24 NOTE — Plan of Care (Signed)
Problem: Phase I Progression Outcomes Goal: Initial discharge plan identified Outcome: Completed/Met Date Met:  07/24/13 Pt. Recommends ST SNF placement

## 2013-07-25 LAB — CULTURE, BLOOD (ROUTINE X 2): Culture: NO GROWTH

## 2013-07-25 LAB — BASIC METABOLIC PANEL
BUN: 7 mg/dL (ref 6–23)
Calcium: 7.9 mg/dL — ABNORMAL LOW (ref 8.4–10.5)
Chloride: 98 mEq/L (ref 96–112)
GFR calc Af Amer: >90 mL/min (ref >90)
GFR calc non Af Amer: >90 mL/min (ref >90)
Potassium: 4 mEq/L (ref 3.5–5.1)

## 2013-07-25 LAB — GLUCOSE, CAPILLARY
Glucose-Capillary: 151 mg/dL — ABNORMAL HIGH (ref 70–99)
Glucose-Capillary: 174 mg/dL — ABNORMAL HIGH (ref 70–99)

## 2013-07-25 MED ORDER — TRAMADOL HCL 50 MG PO TABS: 50.0000 mg | ORAL_TABLET | Freq: Four times a day (QID) | ORAL | Status: DC | PRN

## 2013-07-25 MED ORDER — THIAMINE HCL 100 MG PO TABS: 100.0000 mg | ORAL_TABLET | Freq: Every day | ORAL | Status: DC

## 2013-07-25 MED ORDER — PANTOPRAZOLE SODIUM 40 MG PO TBEC: 40.0000 mg | DELAYED_RELEASE_TABLET | Freq: Two times a day (BID) | ORAL | Status: AC

## 2013-07-25 MED ORDER — PANTOPRAZOLE SODIUM 40 MG PO TBEC: 40.0000 mg | DELAYED_RELEASE_TABLET | Freq: Two times a day (BID) | ORAL | Status: DC

## 2013-07-25 MED ORDER — PANCRELIPASE (LIP-PROT-AMYL) 12000-38000 UNITS PO CPEP: 1.0000 | ORAL_CAPSULE | Freq: Three times a day (TID) | ORAL | Status: AC

## 2013-07-25 MED ORDER — GLUCERNA SHAKE PO LIQD: 237.0000 mL | Freq: Two times a day (BID) | ORAL | Status: AC

## 2013-07-25 MED ORDER — INSULIN DETEMIR 100 UNIT/ML ~~LOC~~ SOLN: 2.0000 [IU] | Freq: Every day | SUBCUTANEOUS | Status: AC

## 2013-07-25 MED ORDER — SUCRALFATE 1 GM/10ML PO SUSP: 1.0000 g | Freq: Three times a day (TID) | ORAL | Status: AC

## 2013-07-25 MED ORDER — GLUCERNA SHAKE PO LIQD
237.0000 mL | Freq: Two times a day (BID) | ORAL | Status: DC
  Administered 2013-07-25: 11:00:00 237 mL via ORAL

## 2013-07-25 NOTE — Progress Notes (Signed)
Esoph bx's show ulceration without evidence of dysplasia or infection. Will leave decision about whether to do f/u egd in a couple of months to confirm healing up to Dr. Randa Evens.  Pt still with some nocturnal reflux sx, but feeling better by his report.  Will switch Protonix to po.  Recomm:  1.  We will sign off; call us if we can be of further help. 2.  OK to restart ASA and Plavix at any time from our standpoint. 3.  Would recomm bid PPI for 1 month, then qd thereafter.  Would favor PPI other than omeprazole b/o possible interaction w/ Plavix, although that is controversial. 4.  As mentioned, will leave decision regarding f/u egd to Dr. Randa Evens.  This pt's esophagitis was probably acute (due to DKA, protracted vomiting) rather than chronic so f/u may not be necessary.  Mitchell Mccall, M.D. [phone]

## 2013-07-25 NOTE — Progress Notes (Signed)
Home care SW made visit to Patient to discuss discharge plans.  Hospital SW, Beverely Pace, stated he was just waiting on the PASARR Number and Patient had agreed to go to [facility].  SW discussed Revocation from Hospice as Medicare will not pay for both Skilled Care and Hospice services.  SW also spoke to Daughter, Sofie Rower, by phone and explained the same thing.  Patient signed Revocation paperwork and Discharge Instructions and SW will send copies of paperwork to Patient once he leaves the hospital.  No other needs noted at this time.  Maretta Los, LCSW, ACHP-SW [facility] [place] [phone]

## 2013-07-25 NOTE — Progress Notes (Signed)
NUTRITION FOLLOW-UP  DOCUMENTATION CODES Per approved criteria  -Severe malnutrition in the context of chronic illness -Underweight   INTERVENTION: Monitor magnesium, potassium, and phosphorus daily for at least 3 days, MD to replete as needed, as pt is at risk for refeeding syndrome given severe malnutrition. Add Glucerna Shakes po BID, 8 oz provides 220 kcal, 10 g protein.  RD to continue to follow nutrition care plan.  NUTRITION DIAGNOSIS: Inadequate oral intake related to variable appetite AEB variable meal completion.  Goal: Patient will meet >/=90% of estimated nutrition needs  Monitor:  PO intake, weight, labs, I/Os  ASSESSMENT: Patient with history of chronic alcoholic pancreatitis, pancreatic pseudocyst, DM, GERD, and chronic malnutrition. He was admitted with DKA. Patient recently admitted in October. His weight has remained stable since then with a fair to good appetite. However, he has lost about 25 pounds in 1 year (20%) and 40 pounds over the last 4-5 years with muscle and fat tissue wasting. He does sometimes drink nutrition supplements at home.   Underwent EGD on 11/28 - findings revealed severe ulcerative esophagitis and severe duodenitis.  Pt reports that he is eating well and he feels as though the carafate is helping him. Has Ensure and Glucerna at home - agreeable to receiving while here.  Patient meets the criteria for severe MALNUTRITION in the context of chronic illness with 20% weight loss in 1 year and severe muscle and fat tissue loss.   Potassium is now WNL, was low. Magnesium is low at 1.3.  Height: Ht Readings from Last 1 Encounters:  07/22/13 5\' 8"  (1.727 m)    Weight: Wt Readings from Last 1 Encounters:  07/22/13 113 lb 15.7 oz (51.7 kg)  Admit wt 100 lb  BMI:  Body mass index is 17.33 kg/(m^2). Patient is underweight.   Estimated Nutritional Needs: Kcal: 1450-1600 kcal Protein: 55-65 g protein Fluid: >1.5 L/day  Skin: Intact  Diet  Order: Carb Control   Intake/Output Summary (Last 24 hours) at 07/25/13 1004 Last data filed at 07/25/13 0900  Gross per 24 hour  Intake      0 ml  Output   4800 ml  Net  -4800 ml    Last BM: 12/1  Labs:   Recent Labs Lab 07/23/13 1600 07/24/13 0500 07/25/13 0500  NA 126* 135 132*  K 3.5 2.9* 4.0  CL 92* 100 98  CO2 28 31 28   BUN 6 7 7   CREATININE 0.37* 0.27* 0.31*  CALCIUM 7.2* 7.5* 7.9*  MG 1.3*  --   --   GLUCOSE 273* 85 100*    CBG (last 3)   Recent Labs  07/25/13 0200 07/25/13 0359 07/25/13 0753  GLUCAP 233* 151* 77    Scheduled Meds: . DULoxetine  30 mg Oral BID  . folic acid  1 mg Oral Daily  . gabapentin  300 mg Oral TID  . insulin detemir  2 Units Subcutaneous QHS  . lipase/protease/amylase  1 capsule Oral TID AC  . multivitamin with minerals  1 tablet Oral Daily  . nicotine  14 mg Transdermal Daily  . pantoprazole  40 mg Oral BID AC  . sodium chloride  10-40 mL Intracatheter Q12H  . sodium chloride  3 mL Intravenous Q12H  . sucralfate  1 g Oral TID WC & HS  . thiamine  100 mg Oral Daily    Continuous Infusions: . sodium chloride 75 mL/hr at 07/24/13 2051    Jarold Motto MS, RD, LDN Pager: [phone] After-hours pager:  [phone]  

## 2013-07-25 NOTE — Discharge Summary (Signed)
Physician Discharge Summary  Mitchell Mccall:[id number] DOB: Jan 08, 1951 DOA: 07/18/2013  PCP:  Duane Lope, MD  Admit date: 07/18/2013 Discharge date: 07/25/2013  Time spent: 35 minutes  Recommendations for Outpatient Follow-up:  1. Need B-met to follow potassium level. 2. CBC to follow HB level.  3. Needs to follow up with Dr Randa Evens, for consideration of repeating  endoscopy to assess  resolution of esophagitis.  4. Monitor CBG, need to be cautious with insulin dose, patient very sensitive to doses, high risk for hypoglycemia.    Discharge Diagnoses:    DKA, type 2   Severe ulcerative esophagitis   Hypoglycemia   Diarrhea, c diff negative, improved on pancreatic enzymes.    PVD RCIA PTA 11/12, LCIA PTA  Jan 2013 - patent left CIA stent, right CIA stenosis via PV angio 06/01/13   Hypertension   Pancreatic pseudocyst   DM (diabetes mellitus), type 2, uncontrolled with complications   Chronic alcohol dependence, continuous   Hypokalemia   Metabolic acidosis   Diarrhea   Intravascular volume depletion   Protein-calorie malnutrition, severe   Discharge Condition: Stable  Diet recommendation: Carb Modified.   Filed Weights   07/18/13 2105 07/22/13 1409  Weight: 45.7 kg (100 lb 12 oz) 51.7 kg (113 lb 15.7 oz)    History of present illness:  Mitchell Mccall is an 62 y.o. male with hx of chronic alcoholic pancreatitis, pancreatic pseudocyst, s/p CCY, severe vasculopath, HTN, DM2, GERD, hx of PUD, hx of seizure, prior DTs, presents to the ER with abdominal pain, nausea, vomiting and diarrhea for 3 days. He has hx of chronic abdominal pain, and earlier this year, had CCY, but pain persisted. He denied drinking alcohol, but record indicates otherwise. He has no distant travel, ill contact, or recent antibiotic use. There has been no fever, chills, coughs, black or bloody stools. Evaluation in the ER showed increased AG metabolic acidosis, with PH of 7.1, K of 2.9. Bicarb of 9, but normal  WBC and Hb. His renal fx tests were also normal. He has elevated LFTs to include AST 264, ALT 131, AP of 336. His CXR was clear. His ETOH level was negative. Hospitalist was asked to admit him for DKA, volume depletion, severe hypokalemia, and abdominal pain.    Hospital Course:  1.Metabolic Acidosis; he presents with increase anion gap metabolic acidosis. Gap at 38. Multifactorial DKA, acidosis from GI losses.  -Resolved. Gap close.   2-Severe ulcerative esophagitis; Nausea, vomiting, coffee ground emesis: Continue with protonix BID for one month then daily. GI consulted. Ok to resume plavix and aspirin at discharge. . Endoscopy show severe ulcerative esophagitis. Tolerating diet.   3-Transaminases: Abdominal US: Surgical absence of the gallbladder. Diffuse fatty infiltration of the liver. No specific acute abnormalities demonstrated. GI consulted. Trending down.   4-Diabetes, DKA: see # 1. Resolved.   5-Alcohol withdrawal; received ativan. He was monitor on CIWA protocol. He only required one dose of ativan 12-01.   6-Chronic pancreatitis. Might explain some component of abdominal pain. Abdominal pain better.  7-Diarrhea: C diff negative. Continue with pancreatic enzymes. Resolved. 2 BM yesterday. One today. Form stool.  8-DVT prophylaxis; SCD. Hold heparin due to GI bleed.  9-Hypoglycemia: Discontinue SSI due to hypoglycemia. He is very sensitive to insulin doses. Need to be cautious with insulin doses. I will continue with long acting insulin to avoid relapse of DKA. Tolerating 2 units of levemir.  10-Hypokalemia: Resolved.  12-Blurry vision, headache: MRI brain negative. Could be related to  alcohol withdraw, episode of hypoglycemia. Improving.  13-Acute blood loss; in setting GI bleed. Severe ulcerative esophagitis, severe duodenitis with erosions without ulceration.  14-Thrombocytopenia: improving. Setting of alcohol use.    Procedures: Endoscopy: 1.Severe Ulcerative Esophagitis. This  extends from the GE junction  through three quarters of the esophagus. Biopsies and cultures on  smear for fungus were obtained.  2. Severe duodenitis with erosions without ulceration  RECOMMENDATIONS:  1. we will add Carafate suspension to his medications in advance to  a low residue diet.    Consultations: Dr Randa Evens   Discharge Exam: Filed Vitals:   07/25/13 0756  BP: 119/72  Pulse: 90  Temp: 98.7 F (37.1 C)  Resp:     General: No distress.  Cardiovascular: S 1, S 2 RRR Respiratory: CTA  Discharge Instructions  Discharge Orders   Future Appointments Provider Department Dept Phone   08/10/2013 8:45 AM Runell Gess, MD [facility] [phone]   Future Orders Complete By Expires   Diet Carb Modified  As directed    Increase activity slowly  As directed        Medication List    STOP taking these medications       NOVOLOG FLEXPEN 100 UNIT/ML Sopn FlexPen  Generic drug:  insulin aspart      TAKE these medications       aspirin EC 81 MG tablet  Take 81 mg by mouth daily.     chlorproMAZINE 25 MG tablet  Commonly known as:  THORAZINE  Take 25-50 mg by mouth 4 (four) times daily as needed (spasms).     clopidogrel 75 MG tablet  Commonly known as:  PLAVIX  Take 1 tablet (75 mg total) by mouth daily.     doxepin 25 MG capsule  Commonly known as:  SINEQUAN  Take 25 mg by mouth at bedtime as needed (for itching and sleep).     DULoxetine 30 MG capsule  Commonly known as:  CYMBALTA  Take 30 mg by mouth 2 (two) times daily.     gabapentin 300 MG capsule  Commonly known as:  NEURONTIN  Take 300 mg by mouth 3 (three) times daily.     insulin detemir 100 UNIT/ML injection  Commonly known as:  LEVEMIR  Inject 0.02 mLs (2 Units total) into the skin at bedtime.     lipase/protease/amylase 09811 UNITS Cpep capsule  Commonly known as:  CREON-12/PANCREASE  Take 1 capsule by mouth 3 (three) times daily before meals.     metoCLOPramide 10  MG tablet  Commonly known as:  REGLAN  Take 10 mg by mouth 4 (four) times daily as needed for nausea.     multivitamins ther. w/minerals Tabs tablet  Take 1 tablet by mouth daily.     nitroGLYCERIN 0.4 MG SL tablet  Commonly known as:  NITROSTAT  Place 0.4 mg under the tongue every 5 (five) minutes x 3 doses as needed for chest pain.     pantoprazole 40 MG tablet  Commonly known as:  PROTONIX  Take 1 tablet (40 mg total) by mouth 2 (two) times daily before a meal.     potassium chloride SA 20 MEQ tablet  Commonly known as:  K-DUR,KLOR-CON  Take 20 mEq by mouth 2 (two) times daily.     sucralfate 1 GM/10ML suspension  Commonly known as:  CARAFATE  Take 10 mLs (1 g total) by mouth 4 (four) times daily -  with meals and at bedtime.  thiamine 100 MG tablet  Commonly known as:  VITAMIN B-1  Take 100 mg by mouth daily.     traMADol 50 MG tablet  Commonly known as:  ULTRAM  Take 1 tablet (50 mg total) by mouth every 6 (six) hours as needed for moderate pain.       No Known Allergies     Follow-up Information   Follow up with Runell Gess, MD In 2 weeks. (office will call you)    Specialty:  Cardiology   Contact information:   [address] [place] [address] [phone]       Follow up with  Duane Lope, MD In 1 week.   Specialty:  Family Medicine   Contact information:   [address] [place] [address] [phone]        The results of significant diagnostics from this hospitalization (including imaging, microbiology, ancillary and laboratory) are listed below for reference.    Significant Diagnostic Studies: Mr Brain Wo Contrast  07/21/2013   CLINICAL DATA:  Double vision, headaches  EXAM: MRI HEAD WITHOUT CONTRAST  TECHNIQUE: Multiplanar, multiecho pulse sequences of the brain and surrounding structures were obtained without intravenous contrast.  COMPARISON:  Prior CT from 05/01/2010 and MRI from 05/15/2009.  FINDINGS: Study  is somewhat degraded by motion artifact.  Scattered and confluent foci of hyperintense T2/FLAIR signal within the periventricular and deep white matter of both frontal lobes are seen, nonspecific, but likely related to chronic small vessel ischemic changes. Similar changes are seen within the pons. Overall, these findings are not significantly changed relative to prior MRI from 05/15/2009.  No diffusion-weighted signal abnormality to suggest acute intracranial infarct is identified. Normal flow voids are seen within the intracranial vasculature. No intracranial hemorrhage.  No mass lesion, midline shift, or localized mass effect. Mild prominence of the CSF containing spaces is compatible with generalized age-related atrophy. Ventricles are normal in size without evidence of hydrocephalus. There is no extra-axial fluid collection.  The craniocervical junction is within normal limits. Incidental note is made of a partially empty sella. Orbital soft tissues, optic nerves, and optic chiasm are within normal limits.  The calvarium demonstrates a normal appearance with normal signal intensity.  Paranasal sinuses and mastoid air cells are well pneumatized and free of fluid.  IMPRESSION: 1. No acute intracranial abnormality identified. 2. Mild atrophy and chronic microvascular ischemic disease, similar as compared to prior MRI from 05/15/2009.   Electronically Signed   By: Rise Mu M.D.   On: 07/21/2013 23:20   US Abdomen Complete  07/19/2013   CLINICAL DATA:  Elevated liver function studies. Nausea. History of chronic pancreatitis in known fatty liver. Cholecystectomy.  EXAM: ULTRASOUND ABDOMEN COMPLETE  COMPARISON:  CT abdomen and pelvis 11/03/2012  FINDINGS: Gallbladder  Gallbladder is surgically absent.  Common bile duct  Diameter: 13.4 mm, likely normal for postoperative physiology. Stable since previous CT scan.  Liver  Diffusely increased hepatic echotexture suggesting diffuse fatty infiltration. No  focal lesions are identified.  IVC  Not visualized due to overlying bowel gas.  Pancreas  Not visualized due to overlying bowel gas.  Spleen  Size and appearance within normal limits.  Right Kidney  Length: 12.4 cm. Echogenicity within normal limits. No mass or hydronephrosis visualized.  Left Kidney  Length: 11.2 cm. Echogenicity within normal limits. No mass or hydronephrosis visualized.  Abdominal aorta  Limited visualization. Visualized portions demonstrate atherosclerotic change but no evidence of aneurysm.  IMPRESSION: Surgical absence of the  gallbladder. Diffuse fatty infiltration of the liver. No specific acute abnormalities demonstrated.   Electronically Signed   By: Burman Nieves M.D.   On: 07/19/2013 01:20   Dg Chest Portable 1 View  07/18/2013   CLINICAL DATA:  Shortness of breath, left-sided chest pain  EXAM: PORTABLE CHEST - 1 VIEW  COMPARISON:  December 11, 2012  FINDINGS: The heart size and mediastinal contours are within normal limits. Both lungs are clear. The visualized skeletal structures are unremarkable.  IMPRESSION: No active cardiopulmonary disease.   Electronically Signed   By: Sherian Rein M.D.   On: 07/18/2013 17:56    Microbiology: Recent Results (from the past 240 hour(s))  MRSA PCR SCREENING     Status: None   Collection Time    07/18/13  9:21 PM      Result Value Range Status   MRSA by PCR NEGATIVE  NEGATIVE Final   Comment:            The GeneXpert MRSA Assay (FDA     approved for NASAL specimens     only), is one component of a     comprehensive MRSA colonization     surveillance program. It is not     intended to diagnose MRSA     infection nor to guide or     monitor treatment for     MRSA infections.  CULTURE, BLOOD (ROUTINE X 2)     Status: None   Collection Time    07/18/13 10:59 PM      Result Value Range Status   Specimen Description BLOOD LEFT FOREARM   Final   Special Requests BOTTLES DRAWN AEROBIC ONLY 4CC   Final   Culture  Setup Time      Final   Value: 07/19/2013 08:59     Performed at [facility]   Culture     Final   Value: NO GROWTH 5 DAYS     Performed at [facility]   Report Status 07/25/2013 FINAL   Final  CULTURE, BLOOD (ROUTINE X 2)     Status: None   Collection Time    07/18/13 11:08 PM      Result Value Range Status   Specimen Description BLOOD LEFT HAND   Final   Special Requests BOTTLES DRAWN AEROBIC ONLY 2CC   Final   Culture  Setup Time     Final   Value: 07/19/2013 08:58     Performed at [facility]   Culture     Final   Value: NO GROWTH 5 DAYS     Performed at [facility]   Report Status 07/25/2013 FINAL   Final  STOOL CULTURE     Status: None   Collection Time    07/19/13  9:59 PM      Result Value Range Status   Specimen Description STOOL   Final   Special Requests NONE   Final   Culture     Final   Value: NO SALMONELLA, SHIGELLA, CAMPYLOBACTER, YERSINIA, OR E.COLI 0157:H7 ISOLATED     Performed at [facility]   Report Status 07/24/2013 FINAL   Final  CLOSTRIDIUM DIFFICILE BY PCR     Status: None   Collection Time    07/19/13  9:59 PM      Result Value Range Status   C difficile by pcr NEGATIVE  NEGATIVE Final  FUNGUS CULTURE W SMEAR     Status: None   Collection Time  07/21/13 10:33 AM      Result Value Range Status   Specimen Description ESOPHAGUS   Final   Special Requests ESOPHAGUS BRUSHING   Final   Fungal Smear     Final   Value: NO YEAST OR FUNGAL ELEMENTS SEEN     Performed at [facility]   Culture     Final   Value: CULTURE IN PROGRESS FOR FOUR WEEKS     Performed at [facility]   Report Status PENDING   Incomplete  FUNGUS CULTURE W SMEAR     Status: None   Collection Time    07/21/13 10:33 AM      Result Value Range Status   Specimen Description ESOPHAGUS   Final   Special Requests ESOPHAGUS BIOPSY   Final   Fungal Smear     Final   Value: NO YEAST OR FUNGAL ELEMENTS SEEN     Performed at [facility]   Culture     Final   Value: CULTURE IN PROGRESS FOR FOUR WEEKS     Performed at [facility]   Report Status PENDING   Incomplete     Labs: Basic Metabolic Panel:  Recent Labs Lab 07/21/13 0600 07/23/13 0500 07/23/13 1600 07/24/13 0500 07/25/13 0500  NA 132* 132* 126* 135 132*  K 3.4* 2.1* 3.5 2.9* 4.0  CL 96 93* 92* 100 98  CO2 30 33* 28 31 28   GLUCOSE 99 77 273* 85 100*  BUN 3* 5* 6 7 7   CREATININE 0.25* 0.28* 0.37* 0.27* 0.31*  CALCIUM 7.6* 7.8* 7.2* 7.5* 7.9*  MG  --   --  1.3*  --   --    Liver Function Tests:  Recent Labs Lab 07/18/13 1735 07/19/13 1025 07/21/13 0600  AST 264* 106* 85*  ALT 131* 78* 53  ALKPHOS 356* 231* 167*  BILITOT 1.9* 1.3* 0.6  PROT 8.0 6.3 5.0*  ALBUMIN 4.1 3.3* 2.5*    Recent Labs Lab 07/18/13 2121  LIPASE 10*   No results found for this basename: AMMONIA,  in the last 168 hours CBC:  Recent Labs Lab 07/18/13 1735  07/20/13 0540 07/21/13 0600 07/22/13 0800 07/23/13 0500 07/24/13 0500  WBC 10.8*  < > 14.5* 6.5 6.3 6.1 6.4  NEUTROABS 9.1*  --   --   --   --   --   --   HGB 13.8  < > 10.7* 8.7* 8.9* 9.0* 8.6*  HCT 39.1  < > 29.3* 23.9* 24.7* 24.6* 23.4*  MCV 104.0*  < > 98.7 98.0 98.4 97.6 98.3  PLT 203  < > 136* 85* 92* 122* 166  < > = values in this interval not displayed. Cardiac Enzymes:  Recent Labs Lab 07/18/13 2121 07/19/13 0409 07/19/13 1025  TROPONINI <0.30 <0.30 <0.30   BNP: BNP (last 3 results) No results found for this basename: PROBNP,  in the last 8760 hours CBG:  Recent Labs Lab 07/24/13 2151 07/24/13 2347 07/25/13 0200 07/25/13 0359 07/25/13 0753  GLUCAP 307* 314* 233* 151* 77       Signed:  Chavie Kolinski  [facility] 07/25/2013, 10:01 AM

## 2013-07-25 NOTE — Progress Notes (Addendum)
Room 5W 29 - Mitchell Mccall - [facility] RN Visit-R.Estera Ozier RN  Related admission to [facility] diagnosis of chronic pancreatitis.  Pt is FULL code.    Pt alert & oriented, sitting up in bed, drinking Glycerna without complications.  Pt states he had a rough night with pain in bilateral knees with L>R - states this has been evaluated in the past but he has no results. Pt appeared agitated- states he wants some sleep and everyone keeps coming in his room.   No family present. Patient's home medication list is on shadow chart.   Pt states he is discharging today to "somewhere [address]" - pt to meet with [facility] SW for revocation of [facility].- pt discharging to [facility] for rehab.   Please call [facility] @ [phone]- ask for RN Liaison or after hours,ask for on-call RN with any hospice needs.   Thank you.  Joneen Boers, RN  [facility]  Hospice Liaison  [phone])

## 2013-07-25 NOTE — Progress Notes (Signed)
Attempted to see pt. Pt going to snf and refused today. Tory Emerald, [place] [phone]

## 2013-07-27 ENCOUNTER — Non-Acute Institutional Stay (SKILLED_NURSING_FACILITY): Payer: Medicare Other | Admitting: Internal Medicine

## 2013-07-27 DIAGNOSIS — E1149 Type 2 diabetes mellitus with other diabetic neurological complication: Secondary | ICD-10-CM

## 2013-07-27 DIAGNOSIS — E876 Hypokalemia: Secondary | ICD-10-CM

## 2013-07-27 DIAGNOSIS — K2210 Ulcer of esophagus without bleeding: Secondary | ICD-10-CM

## 2013-07-27 DIAGNOSIS — G609 Hereditary and idiopathic neuropathy, unspecified: Secondary | ICD-10-CM

## 2013-07-27 NOTE — Clinical Social Work Placement (Signed)
Clinical Social Work Department CLINICAL SOCIAL WORK PLACEMENT NOTE 07/27/2013  Patient:  [name], [name]  Account Number:  [id number] Admit date:  07/18/2013  Clinical Social Worker:  Lavell Luster  Date/time:  07/24/2013 11:00 AM  Clinical Social Work is seeking post-discharge placement for this patient at the following level of care:   SKILLED NURSING   (*CSW will update this form in Epic as items are completed)   07/24/2013  Patient/family provided with [facility] Department of Clinical Social Work's list of facilities offering this level of care within the geographic area requested by the patient (or if unable, by the patient's family).  07/24/2013  Patient/family informed of their freedom to choose among providers that offer the needed level of care, that participate in Medicare, Medicaid or managed care program needed by the patient, have an available bed and are willing to accept the patient.  07/24/2013  Patient/family informed of [facility]' ownership interest in [facility], as well as of the fact that they are under no obligation to receive care at this facility.  PASARR submitted to EDS on 07/24/2013 PASARR number received from EDS on 07/24/2013  FL2 transmitted to all facilities in geographic area requested by pt/family on  07/24/2013 FL2 transmitted to all facilities within larger geographic area on   Patient informed that his/her managed care company has contracts with or will negotiate with  certain facilities, including the following:     Patient/family informed of bed offers received:  07/25/2013 Patient chooses bed at [facility] Physician recommends and patient chooses bed at    Patient to be transferred to [facility] on  07/25/2013 Patient to be transferred to facility by Ambulance  The following physician request were entered in Epic:   Additional Comments: Per MD patient ready to DC to [facility]  07/25/13. Patient, wife, RN, and facility notified of DC. RN given number for report. Ambulance transport requested for patient. DC packet left with patient's chart. CSW signing off.   Roddie Mc, [place], [place], [phone]

## 2013-08-03 ENCOUNTER — Non-Acute Institutional Stay (SKILLED_NURSING_FACILITY): Payer: Medicare Other | Admitting: Internal Medicine

## 2013-08-03 DIAGNOSIS — D638 Anemia in other chronic diseases classified elsewhere: Secondary | ICD-10-CM

## 2013-08-03 DIAGNOSIS — D473 Essential (hemorrhagic) thrombocythemia: Secondary | ICD-10-CM

## 2013-08-03 DIAGNOSIS — D7289 Other specified disorders of white blood cells: Secondary | ICD-10-CM

## 2013-08-03 DIAGNOSIS — D7589 Other specified diseases of blood and blood-forming organs: Secondary | ICD-10-CM

## 2013-08-10 ENCOUNTER — Ambulatory Visit: Payer: Medicare Other | Admitting: Cardiovascular Disease

## 2013-08-19 LAB — FUNGUS CULTURE W SMEAR

## 2013-08-25 ENCOUNTER — Other Ambulatory Visit: Payer: Self-pay | Admitting: *Deleted

## 2013-08-25 MED ORDER — TRAMADOL HCL 50 MG PO TABS: 50.0000 mg | ORAL_TABLET | Freq: Four times a day (QID) | ORAL | Status: AC | PRN

## 2013-08-28 ENCOUNTER — Encounter: Payer: Self-pay | Admitting: Family

## 2013-08-28 ENCOUNTER — Non-Acute Institutional Stay (SKILLED_NURSING_FACILITY): Payer: Medicare Other | Admitting: Family

## 2013-08-28 DIAGNOSIS — E1310 Other specified diabetes mellitus with ketoacidosis without coma: Secondary | ICD-10-CM

## 2013-08-28 DIAGNOSIS — E1110 Type 2 diabetes mellitus with ketoacidosis without coma: Secondary | ICD-10-CM

## 2013-08-28 NOTE — Progress Notes (Signed)
Patient ID: Mitchell Mccall, male   DOB: [birth year], 63 y.o.   MRN: [id number]   Date: 08/28/13 Facility: [facility]  Code Status:  @emerg @  Chief Complaint  Patient presents with  . Medical Managment of Chronic Issues    routine visit    HPI: Pt is being d/c from skilled nursing facility following course of rehabilitation.      No Known Allergies    Medication List       This list is accurate as of: 08/28/13  8:49 PM.  Always use your most recent med list.               aspirin EC 81 MG tablet  Take 81 mg by mouth daily.     chlorproMAZINE 25 MG tablet  Commonly known as:  THORAZINE  Take 25-50 mg by mouth 4 (four) times daily as needed (spasms).     clopidogrel 75 MG tablet  Commonly known as:  PLAVIX  Take 1 tablet (75 mg total) by mouth daily.     doxepin 25 MG capsule  Commonly known as:  SINEQUAN  Take 25 mg by mouth at bedtime as needed (for itching and sleep).     DULoxetine 30 MG capsule  Commonly known as:  CYMBALTA  Take 30 mg by mouth 2 (two) times daily.     feeding supplement (GLUCERNA SHAKE) Liqd  Take 237 mLs by mouth 2 (two) times daily between meals.     gabapentin 300 MG capsule  Commonly known as:  NEURONTIN  Take 300 mg by mouth 3 (three) times daily.     insulin detemir 100 UNIT/ML injection  Commonly known as:  LEVEMIR  Inject 0.02 mLs (2 Units total) into the skin at bedtime.     lipase/protease/amylase 12000 UNITS Cpep capsule  Commonly known as:  CREON-12/PANCREASE  Take 1 capsule by mouth 3 (three) times daily before meals.     metoCLOPramide 10 MG tablet  Commonly known as:  REGLAN  Take 10 mg by mouth 4 (four) times daily as needed for nausea.     multivitamins ther. w/minerals Tabs tablet  Take 1 tablet by mouth daily.     nitroGLYCERIN 0.4 MG SL tablet  Commonly known as:  NITROSTAT  Place 0.4 mg under the tongue every 5 (five) minutes x 3 doses as needed for chest pain.     pantoprazole 40 MG tablet  Commonly  known as:  PROTONIX  Take 1 tablet (40 mg total) by mouth 2 (two) times daily before a meal.     potassium chloride SA 20 MEQ tablet  Commonly known as:  K-DUR,KLOR-CON  Take 20 mEq by mouth 2 (two) times daily.     sucralfate 1 GM/10ML suspension  Commonly known as:  CARAFATE  Take 10 mLs (1 g total) by mouth 4 (four) times daily -  with meals and at bedtime.     thiamine 100 MG tablet  Commonly known as:  VITAMIN B-1  Take 100 mg by mouth daily.     traMADol 50 MG tablet  Commonly known as:  ULTRAM  Take 1 tablet (50 mg total) by mouth every 6 (six) hours as needed for moderate pain.         DATA REVIEWED  Laboratory Studies: Reviewed      Past Medical History  Diagnosis Date  . Peripheral vascular disease Jan 2013, Nov 2012    LCIA stent , RCIA stent  . GERD (gastroesophageal reflux  disease)   . Stomach ulcer   . Hypertension   . Anxiety   . Depression   . H/O hiatal hernia   . Anemia   . Undescended testicle   . Colon polyps   . Chronic lower back pain   . Subdural hematoma JULY 2006  . Duodenitis   . Esophagitis   . Coarse tremors   . Diverticulosis   . Rib fracture DEC 2005    Right sided from a fall  . Rectal bleed JULY 2004  . Vitamin D deficiency   . Macrocytosis   . DTs (delirium tremens)   . Claudication in peripheral vascular disease   . Pancreatitis, alcoholic   . Fatty liver, alcoholic   . Hemorrhoids   . 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 Pancreatic pseudocyst   . Asthma   . Pneumonia DEC 2003  . Type II diabetes mellitus   . Blood dyscrasia     macrocytosis  . ZOXWRUEA(540.9)     "probably monthly" (06/01/2013)  . Seizures     "loses contact w/the world for a minute then comes back; probably q 6 months he'll have a couple of them" (06/01/2013)  . [place] mal seizure ~ 2000    "for awhile" (06/01/2013)  . Hospice care patient     [facility]-call [phone] with any ED  visits/admissions/discharges       Review of Systems  Constitutional: Negative.   Respiratory: Negative.   Cardiovascular: Negative.   Genitourinary: Negative.   Musculoskeletal: Negative.   Neurological:       Spasms   Psychiatric/Behavioral: Negative.      Physical Exam Filed Vitals:   08/28/13 2046  BP: 119/64  Pulse: 97  Temp: 98 F (36.7 C)  Resp: 19   There is no weight on file to calculate BMI. Physical Exam  Constitutional: He is oriented to person, place, and time.  Cardiovascular: Normal rate, regular rhythm and normal heart sounds.   Pulmonary/Chest: Effort normal and breath sounds normal.  Abdominal: Soft. Bowel sounds are normal.  Neurological: He is alert and oriented to person, place, and time.  Intermitted hiccups  Skin: Skin is warm and dry.    ASSESSMENT/PLAN D/C home with [facility], PT, OT, SW

## 2013-09-05 DIAGNOSIS — Z86718 Personal history of other venous thrombosis and embolism: Secondary | ICD-10-CM

## 2013-09-05 DIAGNOSIS — E119 Type 2 diabetes mellitus without complications: Secondary | ICD-10-CM

## 2013-09-05 DIAGNOSIS — I10 Essential (primary) hypertension: Secondary | ICD-10-CM

## 2013-09-05 DIAGNOSIS — R269 Unspecified abnormalities of gait and mobility: Secondary | ICD-10-CM

## 2013-09-05 DIAGNOSIS — K863 Pseudocyst of pancreas: Secondary | ICD-10-CM

## 2013-09-05 DIAGNOSIS — K862 Cyst of pancreas: Secondary | ICD-10-CM

## 2013-09-05 DIAGNOSIS — I739 Peripheral vascular disease, unspecified: Secondary | ICD-10-CM

## 2013-09-08 ENCOUNTER — Ambulatory Visit: Payer: Medicare Other | Admitting: Cardiology

## 2013-09-26 DIAGNOSIS — G609 Hereditary and idiopathic neuropathy, unspecified: Secondary | ICD-10-CM | POA: Insufficient documentation

## 2013-09-26 DIAGNOSIS — K2210 Ulcer of esophagus without bleeding: Secondary | ICD-10-CM | POA: Insufficient documentation

## 2013-09-26 DIAGNOSIS — E1149 Type 2 diabetes mellitus with other diabetic neurological complication: Secondary | ICD-10-CM | POA: Insufficient documentation

## 2013-09-26 NOTE — Progress Notes (Signed)
Patient ID: Mitchell Mccall, male   DOB: 1950-12-17, 63 y.o.   MRN: [id number]               HISTORY & PHYSICAL  DATE: 07/27/2013      FACILITY: [facility]  LEVEL OF CARE: SNF (31)  ALLERGIES:  No Known Allergies  CHIEF COMPLAINT:  Manage ulcerative esophagitis, diabetes mellitus, and peripheral neuropathy.    HISTORY OF PRESENT ILLNESS:  The patient is a 63 year-old, Caucasian male who was hospitalized with abdominal pain, nausea and vomiting, and diarrhea.  After hospitalization, he is admitted to this facility for short-term rehabilitation.   He has the following problems:    SEVERE ULCERATIVE ESOPHAGITIS:  Endoscopy showed severe ulcerative esophagitis.  The patient had coffee-ground emesis.  He is on Protonix b.i.d. for one month at q.d.  He denies further abdominal pain, nausea and vomiting, or diarrhea.    DM:pt's DM remains stable.  Pt denies polyuria, polydipsia, polyphagia, changes in vision or hypoglycemic episodes.  No complications noted from the medication presently being used.  Last hemoglobin A1c is:   Not available.    PERIPHERAL NEUROPATHY: The peripheral neuropathy is stable. The patient denies pain in the feet, tingling, and numbness. No complications noted from the medication presently being used.  PAST MEDICAL HISTORY :  Past Medical History  Diagnosis Date  . Peripheral vascular disease Jan 2013, Nov 2012    LCIA stent , RCIA stent  . GERD (gastroesophageal reflux disease)   . Stomach ulcer   . Hypertension   . Anxiety   . Depression   . H/O hiatal hernia   . Anemia   . Undescended testicle   . Colon polyps   . Chronic lower back pain   . Subdural hematoma JULY 2006  . Duodenitis   . Esophagitis   . Coarse tremors   . Diverticulosis   . Rib fracture DEC 2005    Right sided from a fall  . Rectal bleed JULY 2004  . Vitamin D deficiency   . Macrocytosis   . DTs (delirium tremens)   . Claudication in peripheral vascular disease   .  Pancreatitis, alcoholic   . Fatty liver, alcoholic   . Hemorrhoids   . 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 Pancreatic pseudocyst   . Asthma   . Pneumonia DEC 2003  . Type II diabetes mellitus   . Blood dyscrasia     macrocytosis  . [id number])     "probably monthly" (06/01/2013)  . Seizures     "loses contact w/the world for a minute then comes back; probably q 6 months he'll have a couple of them" (06/01/2013)  . [place] mal seizure ~ 2000    "for awhile" (06/01/2013)  . Hospice care patient     [facility]-call [phone] with any ED visits/admissions/discharges    PAST SURGICAL HISTORY: Past Surgical History  Procedure Laterality Date  . Angioplasty / stenting iliac  Jan 2013, Nov 2012    LCIA stent, RCIA stent  . Ligament repair Left 1955    eg   . False aneurysm repair  09/16/2011    Procedure: REPAIR FALSE ANEURYSM;  Surgeon: Rosetta Posner, MD;  Location: [facility];  Service: Vascular;  Laterality: Left;  . Incision and drainage of wound      groin  . Eus  01/20/2012    Procedure: ESOPHAGEAL ENDOSCOPIC ULTRASOUND (EUS) RADIAL;  Surgeon: Arta Silence, MD;  Location: [facility];  Service: Endoscopy;  Laterality: N/A;  . Fine needle aspiration  01/20/2012    Procedure: FINE NEEDLE ASPIRATION (FNA) LINEAR;  Surgeon: Arta Silence, MD;  Location: [facility];  Service: Endoscopy;  Laterality: N/A;  . Cholecystectomy  05/06/2012    Procedure: LAPAROSCOPIC CHOLECYSTECTOMY WITH INTRAOPERATIVE CHOLANGIOGRAM;  Surgeon: Gayland Curry, MD,FACS;  Location: [facility];  Service: General;  Laterality: N/A;  laparoscopic cholecystectomy with intraoperative cholangiogram  . Ligament repair Right 1990's    "had an extra ligament in & they cut it loose" (06/01/2013)  . Esophagogastroduodenoscopy N/A 07/21/2013    Procedure: ESOPHAGOGASTRODUODENOSCOPY (EGD);  Surgeon: Winfield Cunas., MD;  Location: [facility];  Service: Endoscopy;  Laterality: N/A;     SOCIAL HISTORY:  reports that he has been smoking Cigarettes.  He has a 50 pack-year smoking history. He has never used smokeless tobacco. He reports that he drinks about 12.6 ounces of alcohol per week. He reports that he does not use illicit drugs.  FAMILY HISTORY:  Family History  Problem Relation Age of Onset  . Diabetes Father   . Coronary artery disease Mother     CURRENT MEDICATIONS: Reviewed per [facility]  REVIEW OF SYSTEMS:  See HPI otherwise 14 point ROS is negative.  PHYSICAL EXAMINATION  VS:  T 96.6       P 78      RR 18      BP 120/60      POX 95%        WT (Lb) 102.5    GENERAL: no acute distress, thin body habitus EYES: conjunctivae normal, sclerae normal, normal eye lids MOUTH/THROAT: lips without lesions,no lesions in the mouth,tongue is without lesions,uvula elevates in midline NECK: supple, trachea midline, no neck masses, no thyroid tenderness, no thyromegaly LYMPHATICS: no LAN in the neck, no supraclavicular LAN RESPIRATORY: breathing is even & unlabored, BS CTAB CARDIAC: RRR, no murmur,no extra heart sounds, no edema GI:  ABDOMEN: abdomen soft, normal BS, no masses, no tenderness  LIVER/SPLEEN: no hepatomegaly, no splenomegaly MUSCULOSKELETAL: HEAD: normal to inspection & palpation BACK: no kyphosis, scoliosis or spinal processes tenderness EXTREMITIES: LEFT UPPER EXTREMITY: full range of motion, normal strength & tone RIGHT UPPER EXTREMITY:  full range of motion, normal strength & tone LEFT LOWER EXTREMITY:  full range of motion, normal strength & tone RIGHT LOWER EXTREMITY:  full range of motion, normal strength & tone PSYCHIATRIC: the patient is alert & oriented to person, affect & behavior appropriate  LABS/RADIOLOGY: Alcohol level negative.    Chest x-ray:  Clear.    Abdominal ultrasound:  No acute findings.     Stool for C.diff negative.    MRI of the brain:  No acute findings.    BMP normal.    Magnesium 1.3.    Blood culture x2 showed  no growth.    Stool culture showed no growth.    MRSA by PCR negative.     TSH 2.436.    Vitamin B12 690, RBC folate 1,387.    Labs reviewed: Basic Metabolic Panel:  Recent Labs  06/02/13 0550 06/02/13 1050  07/23/13 1600 07/24/13 0500 07/25/13 0500  NA 141  --   < > 126* 135 132*  K 2.5* 3.7  < > 3.5 2.9* 4.0  CL 101  --   < > 92* 100 98  CO2 29  --   < > 28 31 28   GLUCOSE 37*  --   < > 273* 85 100*  BUN 6  --   < > 6 7 7   CREATININE 0.29*  --   < > 0.37* 0.27* 0.31*  CALCIUM 8.5  --   < > 7.2* 7.5* 7.9*  MG 1.6 2.0  --  1.3*  --   --   < > = values in this interval not displayed. Liver Function Tests:  Recent Labs  07/18/13 1735 07/19/13 1025 07/21/13 0600  AST 264* 106* 85*  ALT 131* 78* 53  ALKPHOS 356* 231* 167*  BILITOT 1.9* 1.3* 0.6  PROT 8.0 6.3 5.0*  ALBUMIN 4.1 3.3* 2.5*    Recent Labs  12/11/12 0640 07/18/13 2121  LIPASE 28 10*   CBC:  Recent Labs  12/11/12 0640  07/18/13 1735  07/22/13 0800 07/23/13 0500 07/24/13 0500  WBC 10.1  < > 10.8*  < > 6.3 6.1 6.4  NEUTROABS 5.3  --  9.1*  --   --   --   --   HGB 12.4*  < > 13.8  < > 8.9* 9.0* 8.6*  HCT 33.8*  < > 39.1  < > 24.7* 24.6* 23.4*  MCV 94.2  < > 104.0*  < > 98.4 97.6 98.3  PLT 174  < > 203  < > 92* 122* 166  < > = values in this interval not displayed.  Cardiac Enzymes:  Recent Labs  07/18/13 2121 07/19/13 0409 07/19/13 1025  TROPONINI <0.30 <0.30 <0.30   CBG:  Recent Labs  07/25/13 0359 07/25/13 0753 07/25/13 1206  GLUCAP 151* 77 174*     ASSESSMENT/PLAN:  Severe ulcerative esophagitis.  Continue PPI.    Diabetes mellitus with neuropathy.  Continue current medications.    Peripheral neuropathy.  Continue Neurontin.    Hypokalemia.  Continue supplementation.  Reassess.    Chronic pancreatis.  Continue pancreatic enzymes.  Acute blood loss anemia.  Reassess hemoglobin level.    Alcohol abuse.  Continue thiamine and multivitamin.    Check CBC and BMP.     I have reviewed patient's medical records received at admission/from hospitalization.  CPT CODE: 00762

## 2013-10-09 NOTE — Progress Notes (Signed)
Patient ID: Mitchell Mccall, male   DOB: [birth year], 63 y.o.   MRN: [id number]          PROGRESS NOTE  DATE: 08/03/2013     FACILITY:  [facility]  LEVEL OF CARE: SNF (31)  Acute Visit  CHIEF COMPLAINT:  Manage leukocytosis & macrocytic anemia.     HISTORY OF PRESENT ILLNESS: I was requested by the staff to assess the patient regarding above problem(s):  LEUKOCYTOSIS:  New problem.  On 08/02/2013:  Patient's WBC was 12.9, ANC 8.1.  In 06/2013:  WBC 6.5.  Patient denies suprapubic pain, flank pain, cough or shortness of breath.    ANEMIA: The anemia has been stable. The patient denies fatigue, melena or hematochezia. The patient is currently not on iron.   On 08/02/2013:  Hemoglobin 10.2, MCV 102.  In 06/2013:  Hemoglobin 8.7, MCV 98.    PAST MEDICAL HISTORY : Reviewed.  No changes.  CURRENT MEDICATIONS: Reviewed per [facility]  REVIEW OF SYSTEMS:  GENERAL: no change in appetite, no fatigue, no weight changes, no fever, chills or weakness RESPIRATORY: no cough, SOB, DOE,, wheezing, hemoptysis CARDIAC: no chest pain, edema or palpitations GI: no abdominal pain, diarrhea, constipation, heart burn, nausea or vomiting  PHYSICAL EXAMINATION  GENERAL: no acute distress, normal body habitus NECK: supple, trachea midline, no neck masses, no thyroid tenderness, no thyromegaly RESPIRATORY: breathing is even & unlabored, BS CTAB CARDIAC: RRR, no murmur,no extra heart sounds, no edema GI: abdomen soft, normal BS, no masses, no tenderness, no hepatomegaly, no splenomegaly PSYCHIATRIC: the patient is alert & oriented to person, affect & behavior appropriate  LABS/RADIOLOGY: On 08/02/2013:  Platelets 484.    In 06/2013:  Platelets 85.    ASSESSMENT/PLAN:  Leukocytosis.  New problem.  Patient asymptomatic.  Reassess on 08/07/2013.    Anemia of chronic disease.  Hemoglobin improved.     Macrocytosis.  New problem.   Check RBC folate and vitamin B12 level.     Thrombocytosis.   New problem.  Acute phase reactant.  We will monitor.    [facility] Metrics:   Aspirin 81 mg q.d.   Positive tobacco use.     CPT CODE: 82505    Mitchell Mccall Y Mitchell Mccall, Mitchell Mccall [phone]

## 2013-10-11 ENCOUNTER — Encounter: Payer: Self-pay | Admitting: Internal Medicine

## 2013-10-11 DIAGNOSIS — D7589 Other specified diseases of blood and blood-forming organs: Secondary | ICD-10-CM | POA: Insufficient documentation

## 2013-10-11 DIAGNOSIS — D473 Essential (hemorrhagic) thrombocythemia: Secondary | ICD-10-CM | POA: Insufficient documentation

## 2013-10-11 DIAGNOSIS — D7289 Other specified disorders of white blood cells: Secondary | ICD-10-CM | POA: Insufficient documentation

## 2013-10-11 DIAGNOSIS — D638 Anemia in other chronic diseases classified elsewhere: Secondary | ICD-10-CM | POA: Insufficient documentation

## 2014-08-02 ENCOUNTER — Encounter (HOSPITAL_COMMUNITY): Payer: Self-pay | Admitting: Cardiovascular Disease

## 2014-08-24 DEATH — deceased

## 2014-12-22 IMAGING — CR DG CHEST 1V PORT
1 series · 1 of 1 positions shown · non-contrast
Comparison: December 11, 2012

CLINICAL DATA: Shortness of breath, left-sided chest pain

EXAM:
PORTABLE CHEST - 1 VIEW

[AP]
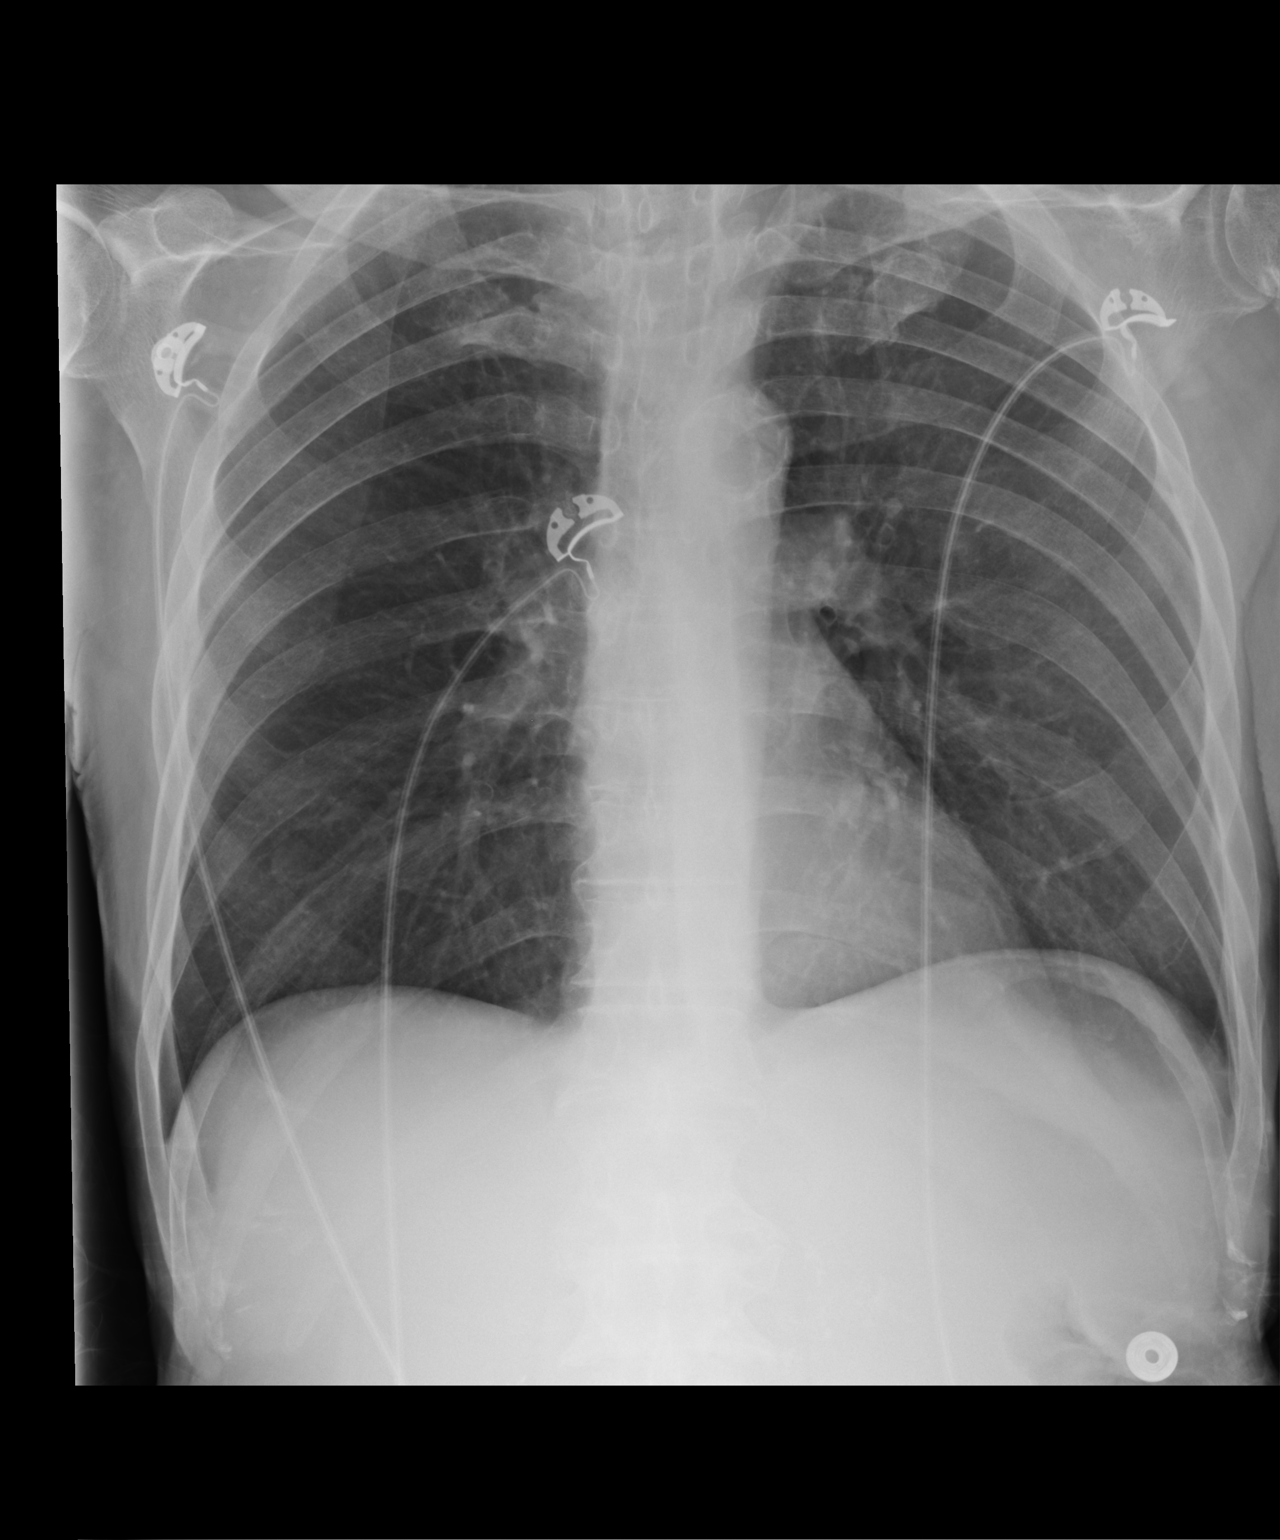

[1 of 1 positions shown; findings below may reference images not displayed]

FINDINGS: The heart size and mediastinal contours are within normal limits.
Both lungs are clear. The visualized skeletal structures are
unremarkable.
IMPRESSION: No active cardiopulmonary disease.

## 2015-02-18 ENCOUNTER — Other Ambulatory Visit: Payer: Self-pay
